# Patient Record
Sex: Male | Born: 1935
Health system: Southern US, Community
[De-identification: ages and names within clinical notes are randomized; demographics above are authoritative.]

## PROBLEM LIST (undated history)

## (undated) ENCOUNTER — Emergency Department (HOSPITAL_COMMUNITY): Admission: EM | Source: Home / Self Care

## (undated) DIAGNOSIS — I10 Essential (primary) hypertension: Secondary | ICD-10-CM

## (undated) DIAGNOSIS — I255 Ischemic cardiomyopathy: Secondary | ICD-10-CM

## (undated) DIAGNOSIS — N183 Chronic kidney disease, stage 3 unspecified: Secondary | ICD-10-CM

## (undated) DIAGNOSIS — I2119 ST elevation (STEMI) myocardial infarction involving other coronary artery of inferior wall: Secondary | ICD-10-CM

## (undated) DIAGNOSIS — I2510 Atherosclerotic heart disease of native coronary artery without angina pectoris: Secondary | ICD-10-CM

## (undated) DIAGNOSIS — I25119 Atherosclerotic heart disease of native coronary artery with unspecified angina pectoris: Secondary | ICD-10-CM

## (undated) DIAGNOSIS — E1121 Type 2 diabetes mellitus with diabetic nephropathy: Secondary | ICD-10-CM

## (undated) DIAGNOSIS — M069 Rheumatoid arthritis, unspecified: Secondary | ICD-10-CM

## (undated) DIAGNOSIS — I129 Hypertensive chronic kidney disease with stage 1 through stage 4 chronic kidney disease, or unspecified chronic kidney disease: Secondary | ICD-10-CM

## (undated) DIAGNOSIS — E118 Type 2 diabetes mellitus with unspecified complications: Secondary | ICD-10-CM

## (undated) DIAGNOSIS — G5603 Carpal tunnel syndrome, bilateral upper limbs: Secondary | ICD-10-CM

## (undated) DIAGNOSIS — E119 Type 2 diabetes mellitus without complications: Secondary | ICD-10-CM

## (undated) DIAGNOSIS — I220 Subsequent ST elevation (STEMI) myocardial infarction of anterior wall: Secondary | ICD-10-CM

## (undated) DIAGNOSIS — K2100 Gastro-esophageal reflux disease with esophagitis, without bleeding: Secondary | ICD-10-CM

## (undated) DIAGNOSIS — E669 Obesity, unspecified: Secondary | ICD-10-CM

## (undated) DIAGNOSIS — I5022 Chronic systolic (congestive) heart failure: Secondary | ICD-10-CM

## (undated) HISTORY — DX: Essential (primary) hypertension: I10

## (undated) HISTORY — DX: Gastro-esophageal reflux disease with esophagitis, without bleeding: K21.00

## (undated) HISTORY — DX: Type 2 diabetes mellitus with unspecified complications: E11.8

## (undated) HISTORY — DX: Hypertensive chronic kidney disease with stage 1 through stage 4 chronic kidney disease, or unspecified chronic kidney disease: I12.9

## (undated) HISTORY — DX: Chronic kidney disease, stage 3 unspecified: N18.30

## (undated) HISTORY — DX: Atherosclerotic heart disease of native coronary artery with unspecified angina pectoris: I25.119

## (undated) HISTORY — DX: Chronic systolic (congestive) heart failure: I50.22

## (undated) HISTORY — DX: Obesity, unspecified: E66.9

## (undated) HISTORY — DX: Carpal tunnel syndrome, bilateral upper limbs: G56.03

## (undated) HISTORY — DX: Subsequent ST elevation (STEMI) myocardial infarction of anterior wall: I22.0

## (undated) HISTORY — PX: APPENDECTOMY: SHX54

## (undated) HISTORY — DX: Rheumatoid arthritis, unspecified: M06.9

## (undated) HISTORY — DX: Type 2 diabetes mellitus with diabetic nephropathy: E11.21

---

## 2005-03-09 ENCOUNTER — Ambulatory Visit (HOSPITAL_COMMUNITY): Admission: RE | Admit: 2005-03-09 | Discharge: 2005-03-09 | Payer: Self-pay | Admitting: Pulmonary Disease

## 2014-03-03 ENCOUNTER — Emergency Department (HOSPITAL_COMMUNITY): Payer: Medicare Other

## 2014-03-03 ENCOUNTER — Encounter (HOSPITAL_COMMUNITY): Payer: Self-pay | Admitting: Emergency Medicine

## 2014-03-03 ENCOUNTER — Emergency Department (HOSPITAL_COMMUNITY)
Admission: EM | Admit: 2014-03-03 | Discharge: 2014-03-03 | Disposition: A | Discharge status: Home / Self Care | Payer: Medicare Other | Attending: Emergency Medicine | Admitting: Emergency Medicine

## 2014-03-03 DIAGNOSIS — E119 Type 2 diabetes mellitus without complications: Secondary | ICD-10-CM | POA: Insufficient documentation

## 2014-03-03 DIAGNOSIS — M1610 Unilateral primary osteoarthritis, unspecified hip: Secondary | ICD-10-CM | POA: Insufficient documentation

## 2014-03-03 DIAGNOSIS — I10 Essential (primary) hypertension: Secondary | ICD-10-CM | POA: Insufficient documentation

## 2014-03-03 DIAGNOSIS — M7918 Myalgia, other site: Secondary | ICD-10-CM

## 2014-03-03 DIAGNOSIS — M169 Osteoarthritis of hip, unspecified: Secondary | ICD-10-CM | POA: Insufficient documentation

## 2014-03-03 DIAGNOSIS — M160 Bilateral primary osteoarthritis of hip: Secondary | ICD-10-CM

## 2014-03-03 DIAGNOSIS — M533 Sacrococcygeal disorders, not elsewhere classified: Secondary | ICD-10-CM | POA: Insufficient documentation

## 2014-03-03 HISTORY — DX: Type 2 diabetes mellitus without complications: E11.9

## 2014-03-03 HISTORY — DX: Essential (primary) hypertension: I10

## 2014-03-03 MED ORDER — TRAMADOL HCL 50 MG PO TABS
50.0000 mg | ORAL_TABLET | Freq: Once | ORAL | Status: AC
  Administered 2014-03-03: 50 mg via ORAL
  Filled 2014-03-03: qty 1

## 2014-03-03 MED ORDER — TRAMADOL HCL 50 MG PO TABS: 50.0000 mg | ORAL_TABLET | Freq: Four times a day (QID) | ORAL | Status: DC | PRN

## 2014-03-03 NOTE — Discharge Instructions (Signed)
Arthritis, Nonspecific Arthritis is pain, redness, warmth, or puffiness (inflammation) of a joint. The joint may be stiff or hurt when you move it. One or more joints may be affected. There are many types of arthritis. Your doctor may not know what type you have right away. The most common cause of arthritis is wear and tear on the joint (osteoarthritis). HOME CARE   Only take medicine as told by your doctor.  Rest the joint as much as possible.  Raise (elevate) your joint if it is puffy.  Use crutches if the painful joint is in your leg.  Drink enough fluids to keep your pee (urine) clear or pale yellow.  Follow your doctor's diet instructions.  Use cold packs for very bad joint pain for 10 to 15 minutes every hour. Ask your doctor if it is okay for you to use hot packs.  Exercise as told by your doctor.  Take a warm shower if you have stiffness in the morning.  Move your sore joints throughout the day. GET HELP RIGHT AWAY IF:   You have a fever.  You have very bad joint pain, puffiness, or redness.  You have many joints that are painful and puffy.  You are not getting better with treatment.  You have very bad back pain or leg weakness.  You cannot control when you poop (bowel movement) or pee (urinate).  You do not feel better in 24 hours or are getting worse.  You are having side effects from your medicine. MAKE SURE YOU:   Understand these instructions.  Will watch your condition.  Will get help right away if you are not doing well or get worse. Document Released: 02/01/2010 Document Revised: 05/08/2012 Document Reviewed: 02/01/2010 [facility] Patient Information 2014 [place], [place].   You probably have a muscle spasm in your hip and thigh (piriformis muscle) causing your pain.  Call your doctor for a recheck and to discussed having physical therapy to treat this problem.  You may use the medicine prescribed for pain.  Use caution as this will make you  drowsy.

## 2014-03-03 NOTE — ED Notes (Signed)
Alert, NAD, family at bedside.pain rt hip and down rt thigh.

## 2014-03-03 NOTE — ED Notes (Signed)
Right leg pain.  Goes from hip down to knee (times 3 weeks).  Denies any injury.

## 2014-03-03 NOTE — ED Provider Notes (Signed)
CSN: [id number]     Arrival date & time 03/03/14  1718 History   First MD Initiated Contact with Patient 03/03/14 1817     Chief Complaint  Patient presents with  . Leg Pain     (Consider location/radiation/quality/duration/timing/severity/associated sxs/prior Treatment) Patient is a 79 y.o. male presenting with leg pain. The history is provided by the patient.  Leg Pain Associated symptoms: no back pain, no fever and no neck pain    Darrell KIEBLER is a 79 y.o. male presenting with a 3 week history of right posterior leg pain which he describes as a muscle spasm type pain which is triggered by walking and certain positions and movements and is worsened when he just gets up from a sitting position and at night particularly when he is lying down and attempts to roll on to his left side.  He denies weakness, numbness in the lower extremity and also denies low back pain, fevers or chills, rash and has had no injuries or falls.  He denies swelling in his leg and has no radiation of pain beyond his knee. He has used Aleve and heating pad with transient relief of symptoms.    Past Medical History  Diagnosis Date  . Diabetes mellitus without complication   . Hypertension    History reviewed. No pertinent past surgical history. History reviewed. No pertinent family history. History  Substance Use Topics  . Smoking status: Never Smoker   . Smokeless tobacco: Not on file  . Alcohol Use: No    Review of Systems  Constitutional: Negative for fever.  Respiratory: Negative.   Cardiovascular: Negative.   Musculoskeletal: Positive for arthralgias. Negative for back pain, joint swelling, myalgias and neck pain.  Skin: Negative for color change and rash.  Neurological: Negative for weakness and numbness.      Allergies  Review of patient's allergies indicates not on file.  Home Medications  No current outpatient prescriptions on file. BP 174/81  Pulse 76  Temp(Src) 97.8 F (36.6 C)  (Oral)  Resp 18  Ht 5\' 8"  (1.727 m)  Wt 255 lb (115.667 kg)  BMI 38.78 kg/m2  SpO2 98% Physical Exam  Constitutional: He appears well-developed and well-nourished.  HENT:  Head: Atraumatic.  Neck: Normal range of motion.  Cardiovascular:  Pulses equal bilaterally  Musculoskeletal: He exhibits tenderness.  ttp right posterior upper thigh,deep in his gluteal fold.  No ttp over lateral thigh or greater trochanteric bursa.  He has pain with resisted abduction of the right hip.  Pedal pulses full, no peripheral edema.  No pain with ROM of knee joint.  Neurological: He is alert. He has normal strength. He displays normal reflexes. No sensory deficit.  Skin: Skin is warm and dry.  Psychiatric: He has a normal mood and affect.    ED Course  Procedures (including critical care time) Labs Review Labs Reviewed - No data to display Imaging Review No results found.   EKG Interpretation None     No results found for this or any previous visit.   MDM   Final diagnoses:  None    Patients labs and/or radiological studies were viewed and considered during the medical decision making and disposition process. Pt was also seen by Dr Jeneen Rinks during this visit.  Pt was prescribed ultram.  Encouraged he may continue taking aleve as long as he has no stomach upset from this medicine.  F/u with pcp this week to arrange PT.  xrays revealing for mild bilateral hip  arthritis.  Pain consistent with muscle spasm or strain,  Possibly piriformis syndrome.    Evalee Jefferson, PA-C 03/03/14 2019

## 2014-03-03 NOTE — ED Provider Notes (Signed)
Pt seen and examined.  History of 2-3 weeks of rt posterior leg pain.  Exam shows findings consistent with Piriformis tightness, sciatica referred pain.  Plan is PCP f/u re: PT referral.  Tanna Furry, MD 03/03/14 2014

## 2014-03-04 NOTE — ED Provider Notes (Signed)
Medical screening examination/treatment/procedure(s) were performed by non-physician practitioner and as supervising physician I was immediately available for consultation/collaboration.   EKG Interpretation None        Tanna Furry, MD 03/04/14 1537

## 2014-05-07 LAB — PSA: PSA: 3.01

## 2015-02-09 DIAGNOSIS — I10 Essential (primary) hypertension: Secondary | ICD-10-CM | POA: Diagnosis not present

## 2015-02-09 DIAGNOSIS — E118 Type 2 diabetes mellitus with unspecified complications: Secondary | ICD-10-CM | POA: Diagnosis not present

## 2015-02-09 DIAGNOSIS — M19041 Primary osteoarthritis, right hand: Secondary | ICD-10-CM | POA: Diagnosis not present

## 2015-02-09 DIAGNOSIS — E669 Obesity, unspecified: Secondary | ICD-10-CM | POA: Diagnosis not present

## 2015-05-21 DIAGNOSIS — Z0000 Encounter for general adult medical examination without abnormal findings: Secondary | ICD-10-CM | POA: Diagnosis not present

## 2015-05-21 DIAGNOSIS — Z1211 Encounter for screening for malignant neoplasm of colon: Secondary | ICD-10-CM | POA: Diagnosis not present

## 2015-05-22 DIAGNOSIS — Z0000 Encounter for general adult medical examination without abnormal findings: Secondary | ICD-10-CM | POA: Diagnosis not present

## 2015-09-22 DIAGNOSIS — E669 Obesity, unspecified: Secondary | ICD-10-CM | POA: Diagnosis not present

## 2015-09-22 DIAGNOSIS — K210 Gastro-esophageal reflux disease with esophagitis: Secondary | ICD-10-CM | POA: Diagnosis not present

## 2015-09-22 DIAGNOSIS — I10 Essential (primary) hypertension: Secondary | ICD-10-CM | POA: Diagnosis not present

## 2015-09-22 DIAGNOSIS — Z23 Encounter for immunization: Secondary | ICD-10-CM | POA: Diagnosis not present

## 2015-09-22 DIAGNOSIS — E119 Type 2 diabetes mellitus without complications: Secondary | ICD-10-CM | POA: Diagnosis not present

## 2015-11-02 DIAGNOSIS — J209 Acute bronchitis, unspecified: Secondary | ICD-10-CM | POA: Diagnosis not present

## 2015-11-02 DIAGNOSIS — E119 Type 2 diabetes mellitus without complications: Secondary | ICD-10-CM | POA: Diagnosis not present

## 2015-11-02 DIAGNOSIS — I10 Essential (primary) hypertension: Secondary | ICD-10-CM | POA: Diagnosis not present

## 2015-11-02 DIAGNOSIS — E669 Obesity, unspecified: Secondary | ICD-10-CM | POA: Diagnosis not present

## 2015-11-30 DIAGNOSIS — H524 Presbyopia: Secondary | ICD-10-CM | POA: Diagnosis not present

## 2015-11-30 DIAGNOSIS — H43813 Vitreous degeneration, bilateral: Secondary | ICD-10-CM | POA: Diagnosis not present

## 2015-11-30 DIAGNOSIS — H52221 Regular astigmatism, right eye: Secondary | ICD-10-CM | POA: Diagnosis not present

## 2015-11-30 DIAGNOSIS — H5201 Hypermetropia, right eye: Secondary | ICD-10-CM | POA: Diagnosis not present

## 2015-12-23 DIAGNOSIS — Z0000 Encounter for general adult medical examination without abnormal findings: Secondary | ICD-10-CM | POA: Diagnosis not present

## 2015-12-24 DIAGNOSIS — I10 Essential (primary) hypertension: Secondary | ICD-10-CM | POA: Diagnosis not present

## 2015-12-24 DIAGNOSIS — E118 Type 2 diabetes mellitus with unspecified complications: Secondary | ICD-10-CM | POA: Diagnosis not present

## 2015-12-24 DIAGNOSIS — Z0000 Encounter for general adult medical examination without abnormal findings: Secondary | ICD-10-CM | POA: Diagnosis not present

## 2016-01-01 LAB — FECAL OCCULT BLOOD, GUAIAC: Fecal Occult Blood: NEGATIVE

## 2016-01-08 DIAGNOSIS — Z1211 Encounter for screening for malignant neoplasm of colon: Secondary | ICD-10-CM | POA: Diagnosis not present

## 2016-01-12 ENCOUNTER — Encounter (HOSPITAL_COMMUNITY): Payer: Self-pay | Admitting: Emergency Medicine

## 2016-01-12 ENCOUNTER — Inpatient Hospital Stay (HOSPITAL_COMMUNITY)
Admission: EM | Admit: 2016-01-12 | Discharge: 2016-01-15 | DRG: 246 | Disposition: A | Discharge status: Home / Self Care | Payer: Medicare Other | Attending: Cardiovascular Disease | Admitting: Cardiovascular Disease

## 2016-01-12 ENCOUNTER — Emergency Department (HOSPITAL_COMMUNITY): Payer: Medicare Other

## 2016-01-12 ENCOUNTER — Encounter (HOSPITAL_COMMUNITY)
Admission: EM | Disposition: A | Discharge status: Home / Self Care | Payer: Self-pay | Source: Home / Self Care | Attending: Cardiovascular Disease

## 2016-01-12 DIAGNOSIS — I2119 ST elevation (STEMI) myocardial infarction involving other coronary artery of inferior wall: Secondary | ICD-10-CM | POA: Diagnosis not present

## 2016-01-12 DIAGNOSIS — E1122 Type 2 diabetes mellitus with diabetic chronic kidney disease: Secondary | ICD-10-CM | POA: Diagnosis not present

## 2016-01-12 DIAGNOSIS — E1129 Type 2 diabetes mellitus with other diabetic kidney complication: Secondary | ICD-10-CM

## 2016-01-12 DIAGNOSIS — I4901 Ventricular fibrillation: Secondary | ICD-10-CM | POA: Diagnosis not present

## 2016-01-12 DIAGNOSIS — Z7984 Long term (current) use of oral hypoglycemic drugs: Secondary | ICD-10-CM

## 2016-01-12 DIAGNOSIS — R079 Chest pain, unspecified: Secondary | ICD-10-CM | POA: Diagnosis not present

## 2016-01-12 DIAGNOSIS — Z9049 Acquired absence of other specified parts of digestive tract: Secondary | ICD-10-CM | POA: Diagnosis not present

## 2016-01-12 DIAGNOSIS — I472 Ventricular tachycardia: Secondary | ICD-10-CM | POA: Diagnosis not present

## 2016-01-12 DIAGNOSIS — E785 Hyperlipidemia, unspecified: Secondary | ICD-10-CM | POA: Diagnosis not present

## 2016-01-12 DIAGNOSIS — I110 Hypertensive heart disease with heart failure: Secondary | ICD-10-CM | POA: Diagnosis not present

## 2016-01-12 DIAGNOSIS — N183 Chronic kidney disease, stage 3 unspecified: Secondary | ICD-10-CM

## 2016-01-12 DIAGNOSIS — I213 ST elevation (STEMI) myocardial infarction of unspecified site: Secondary | ICD-10-CM

## 2016-01-12 DIAGNOSIS — I2111 ST elevation (STEMI) myocardial infarction involving right coronary artery: Secondary | ICD-10-CM | POA: Diagnosis not present

## 2016-01-12 DIAGNOSIS — I10 Essential (primary) hypertension: Secondary | ICD-10-CM | POA: Diagnosis present

## 2016-01-12 DIAGNOSIS — N184 Chronic kidney disease, stage 4 (severe): Secondary | ICD-10-CM

## 2016-01-12 DIAGNOSIS — I129 Hypertensive chronic kidney disease with stage 1 through stage 4 chronic kidney disease, or unspecified chronic kidney disease: Secondary | ICD-10-CM | POA: Diagnosis not present

## 2016-01-12 DIAGNOSIS — R001 Bradycardia, unspecified: Secondary | ICD-10-CM | POA: Diagnosis present

## 2016-01-12 DIAGNOSIS — I2510 Atherosclerotic heart disease of native coronary artery without angina pectoris: Secondary | ICD-10-CM | POA: Diagnosis present

## 2016-01-12 DIAGNOSIS — Z9861 Coronary angioplasty status: Secondary | ICD-10-CM

## 2016-01-12 DIAGNOSIS — I255 Ischemic cardiomyopathy: Secondary | ICD-10-CM | POA: Diagnosis not present

## 2016-01-12 DIAGNOSIS — R0682 Tachypnea, not elsewhere classified: Secondary | ICD-10-CM | POA: Diagnosis not present

## 2016-01-12 DIAGNOSIS — I2582 Chronic total occlusion of coronary artery: Secondary | ICD-10-CM | POA: Diagnosis not present

## 2016-01-12 DIAGNOSIS — Z79899 Other long term (current) drug therapy: Secondary | ICD-10-CM | POA: Diagnosis not present

## 2016-01-12 DIAGNOSIS — Z955 Presence of coronary angioplasty implant and graft: Secondary | ICD-10-CM

## 2016-01-12 HISTORY — DX: Chronic kidney disease, stage 3 (moderate): N18.3

## 2016-01-12 HISTORY — DX: ST elevation (STEMI) myocardial infarction involving other coronary artery of inferior wall: I21.19

## 2016-01-12 HISTORY — DX: Atherosclerotic heart disease of native coronary artery without angina pectoris: I25.10

## 2016-01-12 HISTORY — PX: CORONARY STENT PLACEMENT: SHX1402

## 2016-01-12 HISTORY — PX: CARDIAC CATHETERIZATION: SHX172

## 2016-01-12 HISTORY — DX: Ischemic cardiomyopathy: I25.5

## 2016-01-12 LAB — CBC
HEMATOCRIT: 41.6 % (ref 39.0–52.0)
HEMOGLOBIN: 13.5 g/dL (ref 13.0–17.0)
MCH: 23.2 pg — ABNORMAL LOW (ref 26.0–34.0)
MCHC: 32.5 g/dL (ref 30.0–36.0)
MCV: 71.5 fL — ABNORMAL LOW (ref 78.0–100.0)
Platelets: 255 10*3/uL (ref 150–400)
RBC: 5.82 MIL/uL — ABNORMAL HIGH (ref 4.22–5.81)
RDW: 16.1 % — AB (ref 11.5–15.5)
WBC: 9.2 10*3/uL (ref 4.0–10.5)

## 2016-01-12 LAB — BASIC METABOLIC PANEL
ANION GAP: 9 (ref 5–15)
BUN: 20 mg/dL (ref 6–20)
CO2: 23 mmol/L (ref 22–32)
Calcium: 8.7 mg/dL — ABNORMAL LOW (ref 8.9–10.3)
Chloride: 105 mmol/L (ref 101–111)
Creatinine, Ser: 1.72 mg/dL — ABNORMAL HIGH (ref 0.61–1.24)
GFR calc Af Amer: 42 mL/min — ABNORMAL LOW (ref >60)
GFR, EST NON AFRICAN AMERICAN: 36 mL/min — AB (ref >60)
Glucose, Bld: 130 mg/dL — ABNORMAL HIGH (ref 65–99)
POTASSIUM: 3.6 mmol/L (ref 3.5–5.1)
SODIUM: 137 mmol/L (ref 135–145)

## 2016-01-12 LAB — CBG MONITORING, ED: Glucose-Capillary: 124 mg/dL — ABNORMAL HIGH (ref 65–99)

## 2016-01-12 LAB — TROPONIN I: TROPONIN I: 0.05 ng/mL — AB (ref <0.031)

## 2016-01-12 SURGERY — LEFT HEART CATH AND CORONARY ANGIOGRAPHY
Anesthesia: Moderate Sedation

## 2016-01-12 MED ORDER — FENTANYL CITRATE (PF) 100 MCG/2ML IJ SOLN
INTRAMUSCULAR | Status: AC
  Filled 2016-01-12: qty 2

## 2016-01-12 MED ORDER — MIDAZOLAM HCL 2 MG/2ML IJ SOLN
INTRAMUSCULAR | Status: DC | PRN
  Administered 2016-01-12: 2 mg via INTRAVENOUS

## 2016-01-12 MED ORDER — NITROGLYCERIN 1 MG/10 ML FOR IR/CATH LAB
INTRA_ARTERIAL | Status: AC
  Filled 2016-01-12: qty 10

## 2016-01-12 MED ORDER — MIDAZOLAM HCL 2 MG/2ML IJ SOLN
INTRAMUSCULAR | Status: AC
  Filled 2016-01-12: qty 2

## 2016-01-12 MED ORDER — SODIUM CHLORIDE 0.9 % IV SOLN
INTRAVENOUS | Status: DC
  Administered 2016-01-12: 22:00:00 via INTRAVENOUS

## 2016-01-12 MED ORDER — BIVALIRUDIN 250 MG IV SOLR
INTRAVENOUS | Status: AC
  Filled 2016-01-12: qty 250

## 2016-01-12 MED ORDER — LIDOCAINE HCL (PF) 1 % IJ SOLN
INTRAMUSCULAR | Status: DC | PRN
  Administered 2016-01-12: 15 mL

## 2016-01-12 MED ORDER — LIDOCAINE HCL (PF) 1 % IJ SOLN
INTRAMUSCULAR | Status: AC
  Filled 2016-01-12: qty 30

## 2016-01-12 MED ORDER — NITROGLYCERIN 1 MG/10 ML FOR IR/CATH LAB
INTRA_ARTERIAL | Status: DC | PRN
  Administered 2016-01-12: 200 ug

## 2016-01-12 MED ORDER — ATROPINE SULFATE 1 MG/ML IJ SOLN
1.0000 mg | Freq: Once | INTRAMUSCULAR | Status: AC
  Administered 2016-01-12: 1 mg via INTRAVENOUS

## 2016-01-12 MED ORDER — BIVALIRUDIN BOLUS VIA INFUSION - CUPID
INTRAVENOUS | Status: DC | PRN
  Administered 2016-01-12: 86.4 mg via INTRAVENOUS

## 2016-01-12 MED ORDER — HEPARIN (PORCINE) IN NACL 2-0.9 UNIT/ML-% IJ SOLN
INTRAMUSCULAR | Status: AC
  Filled 2016-01-12: qty 1500

## 2016-01-12 MED ORDER — DOPAMINE-DEXTROSE 3.2-5 MG/ML-% IV SOLN: 0.0000 ug/kg/min | Freq: Once | INTRAVENOUS | Status: DC

## 2016-01-12 MED ORDER — VERAPAMIL HCL 2.5 MG/ML IV SOLN
INTRAVENOUS | Status: AC
  Filled 2016-01-12: qty 2

## 2016-01-12 MED ORDER — TICAGRELOR 90 MG PO TABS
ORAL_TABLET | ORAL | Status: AC
  Filled 2016-01-12: qty 1

## 2016-01-12 MED ORDER — FENTANYL CITRATE (PF) 100 MCG/2ML IJ SOLN
INTRAMUSCULAR | Status: DC | PRN
  Administered 2016-01-12: 25 ug via INTRAVENOUS
  Administered 2016-01-12: 50 ug via INTRAVENOUS

## 2016-01-12 MED ORDER — SODIUM CHLORIDE 0.9 % IV SOLN
250.0000 mg | INTRAVENOUS | Status: DC | PRN
  Administered 2016-01-12: 1.75 mg/kg/h via INTRAVENOUS

## 2016-01-12 MED ORDER — VERAPAMIL HCL 2.5 MG/ML IV SOLN
INTRAVENOUS | Status: DC | PRN
  Administered 2016-01-12: 22:00:00 via INTRA_ARTERIAL

## 2016-01-12 MED ORDER — TICAGRELOR 90 MG PO TABS
ORAL_TABLET | ORAL | Status: DC | PRN
Start: 2016-01-12 — End: 2016-01-12
  Administered 2016-01-12: 180 mg via ORAL

## 2016-01-12 MED ORDER — HEPARIN SODIUM (PORCINE) 5000 UNIT/ML IJ SOLN
4000.0000 [IU] | INTRAMUSCULAR | Status: AC
  Administered 2016-01-12: 4000 [IU] via INTRAVENOUS
  Filled 2016-01-12: qty 1

## 2016-01-12 MED ORDER — ASPIRIN 81 MG PO CHEW
324.0000 mg | CHEWABLE_TABLET | Freq: Once | ORAL | Status: AC
  Administered 2016-01-12: 324 mg via ORAL
  Filled 2016-01-12: qty 4

## 2016-01-12 MED ORDER — HEPARIN (PORCINE) IN NACL 2-0.9 UNIT/ML-% IJ SOLN
INTRAMUSCULAR | Status: DC | PRN
Start: 2016-01-12 — End: 2016-01-12
  Administered 2016-01-12: 23:00:00

## 2016-01-12 SURGICAL SUPPLY — 27 items
BALLN EMERGE MR 2.5X15 (BALLOONS) ×3
BALLN ~~LOC~~ EMERGE MR 3.75X12 (BALLOONS) ×3
BALLOON EMERGE MR 2.5X15 (BALLOONS) ×1 IMPLANT
BALLOON ~~LOC~~ EMERGE MR 3.75X12 (BALLOONS) ×1 IMPLANT
CATH INFINITI 5 FR JL3.5 (CATHETERS) ×3 IMPLANT
CATH INFINITI 5FR JL4 (CATHETERS) ×3 IMPLANT
CATH VISTA GUIDE 6FR JR4 (CATHETERS) ×6 IMPLANT
DEVICE CLOSURE PERCLS PRGLD 6F (VASCULAR PRODUCTS) ×1 IMPLANT
DEVICE RAD COMP TR BAND LRG (VASCULAR PRODUCTS) ×3 IMPLANT
GLIDESHEATH SLEND SS 6F .021 (SHEATH) ×3 IMPLANT
KIT ENCORE 26 ADVANTAGE (KITS) ×3 IMPLANT
KIT HEART LEFT (KITS) ×3 IMPLANT
PACK CARDIAC CATHETERIZATION (CUSTOM PROCEDURE TRAY) ×3 IMPLANT
PERCLOSE PROGLIDE 6F (VASCULAR PRODUCTS) ×3
PINNACLE LONG 6F 25CM (SHEATH) ×3
SHEATH INTRO PINNACLE 6F 25CM (SHEATH) ×1 IMPLANT
SHEATH PINNACLE 6F 10CM (SHEATH) ×3 IMPLANT
SHEATH PINNACLE 7F 10CM (SHEATH) ×3 IMPLANT
SHEATH PINNACLE ST 7F 45CM (SHEATH) ×3 IMPLANT
STENT PROMUS PREM MR 3.5X16 (Permanent Stent) ×3 IMPLANT
TRANSDUCER W/STOPCOCK (MISCELLANEOUS) ×3 IMPLANT
TUBING CIL FLEX 10 FLL-RA (TUBING) ×3 IMPLANT
VALVE GUARDIAN II ~~LOC~~ HEMO (MISCELLANEOUS) ×3 IMPLANT
WIRE AMPLATZ ST .035X145CM (WIRE) ×3 IMPLANT
WIRE COUGAR XT STRL 190CM (WIRE) ×3 IMPLANT
WIRE EMERALD 3MM-J .035X150CM (WIRE) ×3 IMPLANT
WIRE SAFE-T 1.5MM-J .035X260CM (WIRE) ×3 IMPLANT

## 2016-01-12 NOTE — ED Provider Notes (Addendum)
CSN: [id number]     Arrival date & time 01/12/16  2039 History   By signing my name below, I, Forrestine Him, attest that this documentation has been prepared under the direction and in the presence of Ezequiel Essex, MD.  Electronically Signed: Forrestine Him, ED Scribe. 01/12/2016. 9:36 PM.   Chief Complaint  Patient presents with  . Shortness of Breath  . Dizziness   The history is provided by the patient. No language interpreter was used.    LEVEL 5 CAVEAT DUE TO ACUITY OF CONDITION   HPI Comments: GREEN SCHERR brought in by EMS is a 81 y.o. male with a PMHx of DM and HTN who presents to the Emergency Department complaining of shortness of breath onset 4:00 PM this afternoon while spraying tire cleaner on his tires. Pt also reports ongoing, intermittent dizziness, diaphoresis, and nausea. Pt denies any chest pain at this time. No prior cardiac history reported.  PCP: Alonza Bogus, MD    Past Medical History  Diagnosis Date  . Diabetes mellitus without complication ([place])   . Hypertension    Past Surgical History  Procedure Laterality Date  . Appendectomy     History reviewed. No pertinent family history. Social History  Substance Use Topics  . Smoking status: Never Smoker   . Smokeless tobacco: None  . Alcohol Use: No    Review of Systems  LEVEL 5 CAVEAT- DUE TO ACUITY OF CONDITION  Allergies  Review of patient's allergies indicates no known allergies.  Home Medications   Prior to Admission medications   Medication Sig Start Date End Date Taking? Authorizing Provider  diltiazem (DILACOR XR) 180 MG 24 hr capsule Take 180 mg by mouth daily. 02/04/14   Historical Provider, MD  famotidine (PEPCID) 40 MG tablet Take 40 mg by mouth daily. 01/16/14   Historical Provider, MD  losartan (COZAAR) 100 MG tablet Take 100 mg by mouth daily. 03/02/14   Historical Provider, MD  metFORMIN (GLUCOPHAGE) 500 MG tablet Take 500 mg by mouth 2 (two) times daily. 02/21/14   Historical  Provider, MD  traMADol (ULTRAM) 50 MG tablet Take 1 tablet (50 mg total) by mouth every 6 (six) hours as needed. 03/03/14   Evalee Jefferson, PA-C   Triage Vitals: BP 173/83 mmHg  Pulse 73  Temp(Src) 97.5 F (36.4 C) (Axillary)  Resp 20  Ht 5\' 9"  (1.753 m)  Wt 254 lb (115.214 kg)  BMI 37.49 kg/m2  SpO2 100%    Physical Exam  Constitutional: He is oriented to person, place, and time. He appears well-developed and well-nourished. He appears distressed.  Very diaphoretic  HENT:  Head: Normocephalic and atraumatic.  Mouth/Throat: Oropharynx is clear and moist. No oropharyngeal exudate.  Eyes: Conjunctivae and EOM are normal. Pupils are equal, round, and reactive to light.  Neck: Normal range of motion. Neck supple.  No meningismus.  Cardiovascular: Regular rhythm, normal heart sounds and intact distal pulses.  Bradycardia present.   No murmur heard. Bradycardic in the 30'S  Pulmonary/Chest:  Labored respirations  Lungs are clear  Abdominal: Soft. There is no tenderness. There is no rebound and no guarding.  Musculoskeletal: Normal range of motion. He exhibits no edema or tenderness.  No peripheral edema noted   Neurological: He is alert and oriented to person, place, and time. No cranial nerve deficit. He exhibits normal muscle tone. Coordination normal.  No ataxia on finger to nose bilaterally. No pronator drift. 5/5 strength throughout. CN 2-12 intact.Equal grip strength. Sensation intact.  Skin: Skin is warm. He is diaphoretic.  Psychiatric: He has a normal mood and affect. His behavior is normal.  Nursing note and vitals reviewed.   ED Course  Procedures (including critical care time)  DIAGNOSTIC STUDIES: Oxygen Saturation is 100% on RA, Normal by my interpretation.    COORDINATION OF CARE: 9:35 PM- Will give fluids, ASA, heparn, dopamine, and atropine injection. Will order BMP, CBC, CXR, EKG, and Troponin I. Pt will be transferred to [facility] and go straight to cath lab. Discussed  treatment plan with pt at bedside and pt agreed to plan.     Labs Review Labs Reviewed  CBC - Abnormal; Notable for the following:    RBC 5.82 (*)    MCV 71.5 (*)    MCH 23.2 (*)    RDW 16.1 (*)    All other components within normal limits  CBG MONITORING, ED - Abnormal; Notable for the following:    Glucose-Capillary 124 (*)    All other components within normal limits  BASIC METABOLIC PANEL  TROPONIN I  I-STAT TROPOININ, ED    Imaging Review No results found. I have personally reviewed and evaluated these images and lab results as part of my medical decision-making.   EKG Interpretation   Date/Time:  Tuesday January 12 2016 20:54:00 EST Ventricular Rate:  70 PR Interval:  200 QRS Duration: 94 QT Interval:  416 QTC Calculation: 449 R Axis:   3 Text Interpretation:  Normal sinus rhythm Nonspecific T wave abnormality  Abnormal ECG No previous ECGs available Confirmed by Christy Gentles  MD, Elenore Rota  [phone]) on 01/12/2016 9:05:55 PM      MDM   Final diagnoses:  ST elevation myocardial infarction (STEMI), unspecified artery ([place])   On initial evaluation, patient is diaphoretic, dyspneic and ill-appearing. EKG in triage showed sinus rhythm with nonspecific ST abnormalities. He is now bradycardic in the 30s. Repeat EKG shows ST elevation in inferior and lateral leads and code STEMI Activated.  CBG 124. Aspirin, heparin given. atropine given and patient placed on pacer pads. Dopamine readied at bedside.  Discussed with Dr. Burt Knack. He agrees with emergency traffic to [facility] cone catheterization lab. Heart rate now in the 80s after atropine. The EKG did not show any evidence of heart block but did show sinus bradycardia. Dopamine given to EMS but not started.  Blood pressure and mental status stable at time of transfer to EMS. Maintaining airway at time of transfer.  CRITICAL CARE Performed by: Ezequiel Essex Total critical care time: 35 minutes Critical care time was  exclusive of separately billable procedures and treating other patients. Critical care was necessary to treat or prevent imminent or life-threatening deterioration. Critical care was time spent personally by me on the following activities: development of treatment plan with patient and/or surrogate as well as nursing, discussions with consultants, evaluation of patient's response to treatment, examination of patient, obtaining history from patient or surrogate, ordering and performing treatments and interventions, ordering and review of laboratory studies, ordering and review of radiographic studies, pulse oximetry and re-evaluation of patient's condition.  I personally performed the services described in this documentation, which was scribed in my presence. The recorded information has been reviewed and is accurate.    Ezequiel Essex, MD 01/12/16 [id number]  Ezequiel Essex, MD 01/13/16 0000

## 2016-01-12 NOTE — ED Notes (Signed)
Patient states he drank water prior to triage room. Was unable to obtain oral temp. Axillary temp 97.5 at triage.

## 2016-01-12 NOTE — ED Notes (Addendum)
Patient states "I was spraying tire cleaner on my tires today at 4:00 and got a little dizzy and short of breath. I think I inhaled too much of it. I got better, then I had another spell of dizziness about 10 minutes before I got here." Patient denies pain at this time. Patient clammy at triage. States "I've been sweating a lot."

## 2016-01-12 NOTE — H&P (Signed)
Darrell Marshall is an 81 y.o. male.   Chief Complaint: chest pain, inferior STEMI, transfer from [facility] Primary Cardiologist: new Burt Knack HPI: Darrell Marshall is a 81 yo man with PMH of T2DM on metformin and hypertension who presented to [facility] ER with acute onset of dyspnea around 4:00 pm this afternoon. 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 He subsequently noted some central chest discomfort along with associated nausea, diaphoresis and dizziness. He previous lived/worked in [place] for [employer] for 33 years and now works part-time a [employer]. He is active and has no limitations. No previous chest pain or syncope. His parents and siblings did not have CAD. At [facility] a repeat ECG revealed inferior STEMI with bradycardia. Per report he was bradycardic as low as 30s and received atropine with improved HR. He received aspirin 325 mg and heparin 4000 units and was transferred to [facility]. ON arrival, he was immediately taken to the cath lab and he said his chest discomfort was as severe as 8/10.   Left coronary artery with nonobstructive disease and RCA with distal 99% thought to be culprit. He had brief ventricular fibrillation episode that was treated with defibrillation engaging/wiring the RCA and he subsequently received a DES.   Past Medical History  Diagnosis Date  . Diabetes mellitus without complication ([place])   . Hypertension     Past Surgical History  Procedure Laterality Date  . Appendectomy      History reviewed. No pertinent family history. Social History:  reports that he has never smoked. He does not have any smokeless tobacco history on file. He reports that he does not drink alcohol or use illicit drugs.  Allergies: No Known Allergies  Medications Prior to Admission  Medication Sig Dispense Refill  . diltiazem (DILACOR XR) 180 MG 24 hr capsule Take 180 mg by mouth daily.    . famotidine (PEPCID) 40 MG  tablet Take 40 mg by mouth daily.    [name] Kitchen losartan (COZAAR) 100 MG tablet Take 100 mg by mouth daily.    . metFORMIN (GLUCOPHAGE) 500 MG tablet Take 500 mg by mouth 2 (two) times daily.    . traMADol (ULTRAM) 50 MG tablet Take 1 tablet (50 mg total) by mouth every 6 (six) hours as needed. 15 tablet 0    Results for orders placed or performed during the hospital encounter of 01/12/16 (from the past 48 hour(s))  CBG monitoring, ED     Status: Abnormal   Collection Time: 01/12/16  9:19 PM  Result Value Ref Range   Glucose-Capillary 124 (H) 65 - 99 mg/dL   Comment 1 Notify RN    Comment 2 Document in Chart   Basic metabolic panel     Status: Abnormal   Collection Time: 01/12/16  9:20 PM  Result Value Ref Range   Sodium 137 135 - 145 mmol/L   Potassium 3.6 3.5 - 5.1 mmol/L   Chloride 105 101 - 111 mmol/L   CO2 23 22 - 32 mmol/L   Glucose, Bld 130 (H) 65 - 99 mg/dL   BUN 20 6 - 20 mg/dL   Creatinine, Ser 1.72 (H) 0.61 - 1.24 mg/dL   Calcium 8.7 (L) 8.9 - 10.3 mg/dL   GFR calc non Af Amer 36 (L) >60 mL/min   GFR calc Af Amer 42 (L) >60 mL/min    Comment: (NOTE) The eGFR has been calculated using the  CKD EPI equation. This calculation has not been validated in all clinical situations. eGFR's persistently <60 mL/min signify possible Chronic Kidney Disease.    Anion gap 9 5 - 15  CBC     Status: Abnormal   Collection Time: 01/12/16  9:20 PM  Result Value Ref Range   WBC 9.2 4.0 - 10.5 K/uL   RBC 5.82 (H) 4.22 - 5.81 MIL/uL   Hemoglobin 13.5 13.0 - 17.0 g/dL   HCT 41.6 39.0 - 52.0 %   MCV 71.5 (L) 78.0 - 100.0 fL   MCH 23.2 (L) 26.0 - 34.0 pg   MCHC 32.5 30.0 - 36.0 g/dL   RDW 16.1 (H) 11.5 - 15.5 %   Platelets 255 150 - 400 K/uL  Troponin I     Status: Abnormal   Collection Time: 01/12/16  9:20 PM  Result Value Ref Range   Troponin I 0.05 (H) <0.031 ng/mL    Comment:        PERSISTENTLY INCREASED TROPONIN VALUES IN THE RANGE OF 0.04-0.49 ng/mL CAN BE SEEN IN:       -UNSTABLE  ANGINA       -CONGESTIVE HEART FAILURE       -MYOCARDITIS       -CHEST TRAUMA       -ARRYHTHMIAS       -LATE PRESENTING MYOCARDIAL INFARCTION       -COPD   CLINICAL FOLLOW-UP RECOMMENDED.    No results found.  Review of Systems  Constitutional: Negative for fever, chills, weight loss and malaise/fatigue.  HENT: Negative for ear pain and tinnitus.   Eyes: Negative for double vision and photophobia.  Respiratory: Positive for shortness of breath. Negative for cough and hemoptysis.   Cardiovascular: Positive for chest pain. Negative for leg swelling and PND.  Gastrointestinal: Positive for nausea. Negative for heartburn, vomiting and abdominal pain.  Genitourinary: Negative for dysuria and hematuria.  Musculoskeletal: Negative for myalgias and neck pain.  Skin: Negative for rash.  Neurological: Positive for dizziness. Negative for tingling, tremors and headaches.  Endo/Heme/Allergies: Negative for polydipsia. Does not bruise/bleed easily.  Psychiatric/Behavioral: Negative for depression, suicidal ideas and substance abuse.    Blood pressure 173/83, pulse 73, temperature 97.5 F (36.4 C), temperature source Axillary, resp. rate 20, height _0  (1.753 m), weight 115.214 kg (254 lb), SpO2 100 %. Physical Exam  Nursing note and vitals reviewed. Constitutional: He is oriented to person, place, and time. He appears well-developed and well-nourished. He appears distressed.  HENT:  Nose: Nose normal.  Mouth/Throat: Oropharynx is clear and moist. No oropharyngeal exudate.  Eyes: Conjunctivae and EOM are normal. Pupils are equal, round, and reactive to light. No scleral icterus.  Neck: Normal range of motion. Neck supple. No JVD present. No tracheal deviation present.  Cardiovascular: Normal rate, regular rhythm, normal heart sounds and intact distal pulses.  Exam reveals no gallop.   No murmur heard. Respiratory: Effort normal and breath sounds normal. No respiratory distress. He has no  wheezes. He has no rales.  GI: Soft. Bowel sounds are normal. He exhibits no distension. There is no tenderness. There is no rebound and no guarding.  Musculoskeletal: Normal range of motion. He exhibits no edema or tenderness.  Neurological: He is alert and oriented to person, place, and time. No cranial nerve deficit. Coordination normal.  Skin: Skin is warm. No rash noted. He is diaphoretic. No erythema.  Psychiatric: He has a normal mood and affect. His behavior is normal. Judgment and thought content normal.  EKG with sinus bradycardia, inferior STEMI Creatinine 1.7  Assessment/Plan Darrell Marshall is a 81 yo man with PMH of T2DM and hypertension who presented with chest pain and found to have inferior STEMI now s/p PCI to distal RCA. Loaded with ticagrelor in cath lab. Plan for ICU admission with telemetry and post-STEMI care.  Problem List Chest Pain with Inferior STEMI Chronic Kidney Disease Stage III T2DM Hypertension Dyslipidemia  Plan 1. Trend troponins, admit to CCU 2. Continue DAPT with ticagrelor 90 mg bid and asa 81 mg for at least one year 3. BNP, TSH, lipid panel, hba1c 4. Atorvastatin 80 mg qHS first dose now 5. Refer to cardiac rehab at discharge  Jules Husbands, MD 01/12/2016, 10:08 PM

## 2016-01-12 NOTE — ED Notes (Signed)
EKG given to Dr. Melanee Left.

## 2016-01-12 NOTE — ED Notes (Signed)
Took patient to room via wheelchair. Patient Ambulated from wheelchair to stretcher without difficulty. As patient was lying in bed he voiced to writer "my chest is burning." Patient became diaphoretic and monitor showed heart rate of 45. EKG previously obtained in triage room and given to Dr. Melanee Left. Obtained new EKG r/t new symptoms and gave to Dr. Melanee Left.

## 2016-01-13 ENCOUNTER — Ambulatory Visit (HOSPITAL_COMMUNITY): Payer: Medicare Other

## 2016-01-13 ENCOUNTER — Encounter (HOSPITAL_COMMUNITY): Payer: Self-pay | Admitting: Cardiovascular Disease

## 2016-01-13 DIAGNOSIS — I10 Essential (primary) hypertension: Secondary | ICD-10-CM

## 2016-01-13 DIAGNOSIS — E785 Hyperlipidemia, unspecified: Secondary | ICD-10-CM

## 2016-01-13 LAB — CBC
HCT: 43.9 % (ref 39.0–52.0)
HEMATOCRIT: 44.4 % (ref 39.0–52.0)
HEMOGLOBIN: 14.2 g/dL (ref 13.0–17.0)
HEMOGLOBIN: 13.9 g/dL (ref 13.0–17.0)
MCH: 22.7 pg — ABNORMAL LOW (ref 26.0–34.0)
MCH: 22.8 pg — ABNORMAL LOW (ref 26.0–34.0)
MCHC: 32 g/dL (ref 30.0–36.0)
MCHC: 31.7 g/dL (ref 30.0–36.0)
MCV: 71 fL — AB (ref 78.0–100.0)
MCV: 72 fL — ABNORMAL LOW (ref 78.0–100.0)
PLATELETS: 241 10*3/uL (ref 150–400)
Platelets: 250 10*3/uL (ref 150–400)
RBC: 6.25 MIL/uL — ABNORMAL HIGH (ref 4.22–5.81)
RBC: 6.1 MIL/uL — AB (ref 4.22–5.81)
RDW: 16.1 % — ABNORMAL HIGH (ref 11.5–15.5)
RDW: 16.4 % — ABNORMAL HIGH (ref 11.5–15.5)
WBC: 11.2 10*3/uL — AB (ref 4.0–10.5)
WBC: 9.9 10*3/uL (ref 4.0–10.5)

## 2016-01-13 LAB — GLUCOSE, CAPILLARY
GLUCOSE-CAPILLARY: 111 mg/dL — AB (ref 65–99)
Glucose-Capillary: 108 mg/dL — ABNORMAL HIGH (ref 65–99)

## 2016-01-13 LAB — TROPONIN I
Troponin I: 44.48 ng/mL (ref <0.031)
Troponin I: >65 ng/mL (ref <0.031)

## 2016-01-13 LAB — LIPID PANEL
CHOL/HDL RATIO: 4.8 ratio
CHOLESTEROL: 157 mg/dL (ref 0–200)
HDL: 33 mg/dL — ABNORMAL LOW (ref >40)
LDL Cholesterol: 82 mg/dL (ref 0–99)
Triglycerides: 208 mg/dL — ABNORMAL HIGH (ref <150)
VLDL: 42 mg/dL — ABNORMAL HIGH (ref 0–40)

## 2016-01-13 LAB — BASIC METABOLIC PANEL
ANION GAP: 16 — AB (ref 5–15)
BUN: 15 mg/dL (ref 6–20)
CALCIUM: 9.3 mg/dL (ref 8.9–10.3)
CHLORIDE: 105 mmol/L (ref 101–111)
CO2: 20 mmol/L — AB (ref 22–32)
CREATININE: 1.63 mg/dL — AB (ref 0.61–1.24)
GFR calc non Af Amer: 38 mL/min — ABNORMAL LOW (ref >60)
GFR, EST AFRICAN AMERICAN: 45 mL/min — AB (ref >60)
Glucose, Bld: 120 mg/dL — ABNORMAL HIGH (ref 65–99)
Potassium: 4.5 mmol/L (ref 3.5–5.1)
SODIUM: 141 mmol/L (ref 135–145)

## 2016-01-13 LAB — BRAIN NATRIURETIC PEPTIDE: B NATRIURETIC PEPTIDE 5: 16.1 pg/mL (ref 0.0–100.0)

## 2016-01-13 LAB — CREATININE, SERUM
Creatinine, Ser: 1.83 mg/dL — ABNORMAL HIGH (ref 0.61–1.24)
GFR, EST AFRICAN AMERICAN: 39 mL/min — AB (ref >60)
GFR, EST NON AFRICAN AMERICAN: 33 mL/min — AB (ref >60)

## 2016-01-13 LAB — POCT ACTIVATED CLOTTING TIME: ACTIVATED CLOTTING TIME: 358 s

## 2016-01-13 MED ORDER — HEPARIN SODIUM (PORCINE) 5000 UNIT/ML IJ SOLN
5000.0000 [IU] | Freq: Three times a day (TID) | INTRAMUSCULAR | Status: DC
  Administered 2016-01-13 – 2016-01-15 (×7): 5000 [IU] via SUBCUTANEOUS
  Filled 2016-01-13 (×7): qty 1

## 2016-01-13 MED ORDER — AMLODIPINE BESYLATE 2.5 MG PO TABS
2.5000 mg | ORAL_TABLET | Freq: Every day | ORAL | Status: DC
  Administered 2016-01-13: 2.5 mg via ORAL
  Filled 2016-01-13: qty 1

## 2016-01-13 MED ORDER — SODIUM CHLORIDE 0.9 % IV SOLN
INTRAVENOUS | Status: AC
Start: 2016-01-13 — End: 2016-01-13
  Administered 2016-01-12: via INTRAVENOUS

## 2016-01-13 MED ORDER — INSULIN ASPART 100 UNIT/ML ~~LOC~~ SOLN: 0.0000 [IU] | Freq: Every day | SUBCUTANEOUS | Status: DC

## 2016-01-13 MED ORDER — NITROGLYCERIN 0.4 MG SL SUBL: 0.4000 mg | SUBLINGUAL_TABLET | SUBLINGUAL | Status: DC | PRN

## 2016-01-13 MED ORDER — TICAGRELOR 90 MG PO TABS
90.0000 mg | ORAL_TABLET | Freq: Two times a day (BID) | ORAL | Status: DC
  Administered 2016-01-13 – 2016-01-15 (×5): 90 mg via ORAL
  Filled 2016-01-13 (×5): qty 1

## 2016-01-13 MED ORDER — ACETAMINOPHEN 325 MG PO TABS: 650.0000 mg | ORAL_TABLET | ORAL | Status: DC | PRN

## 2016-01-13 MED ORDER — INSULIN ASPART 100 UNIT/ML ~~LOC~~ SOLN
0.0000 [IU] | Freq: Three times a day (TID) | SUBCUTANEOUS | Status: DC
  Administered 2016-01-13 – 2016-01-15 (×3): 2 [IU] via SUBCUTANEOUS

## 2016-01-13 MED ORDER — SODIUM CHLORIDE 0.9% FLUSH
3.0000 mL | Freq: Two times a day (BID) | INTRAVENOUS | Status: DC
  Administered 2016-01-13 – 2016-01-15 (×5): 3 mL via INTRAVENOUS

## 2016-01-13 MED ORDER — ASPIRIN 81 MG PO CHEW
81.0000 mg | CHEWABLE_TABLET | Freq: Every day | ORAL | Status: DC
  Administered 2016-01-13 – 2016-01-15 (×3): 81 mg via ORAL
  Filled 2016-01-13 (×3): qty 1

## 2016-01-13 MED ORDER — ONDANSETRON HCL 4 MG/2ML IJ SOLN: 4.0000 mg | Freq: Four times a day (QID) | INTRAMUSCULAR | Status: DC | PRN

## 2016-01-13 MED ORDER — AMLODIPINE BESYLATE 5 MG PO TABS
5.0000 mg | ORAL_TABLET | Freq: Every day | ORAL | Status: DC
  Administered 2016-01-14 – 2016-01-15 (×2): 5 mg via ORAL
  Filled 2016-01-13 (×2): qty 1

## 2016-01-13 MED ORDER — HYDRALAZINE HCL 20 MG/ML IJ SOLN
10.0000 mg | Freq: Four times a day (QID) | INTRAMUSCULAR | Status: DC | PRN
  Administered 2016-01-13: 10 mg via INTRAVENOUS
  Filled 2016-01-13: qty 1

## 2016-01-13 MED ORDER — DOCUSATE SODIUM 100 MG PO CAPS
100.0000 mg | ORAL_CAPSULE | Freq: Two times a day (BID) | ORAL | Status: DC
  Administered 2016-01-13 – 2016-01-15 (×4): 100 mg via ORAL
  Filled 2016-01-13 (×4): qty 1

## 2016-01-13 MED ORDER — MORPHINE SULFATE (PF) 2 MG/ML IV SOLN: 2.0000 mg | INTRAVENOUS | Status: DC | PRN

## 2016-01-13 MED ORDER — HYDRALAZINE HCL 25 MG PO TABS
25.0000 mg | ORAL_TABLET | Freq: Three times a day (TID) | ORAL | Status: DC
  Administered 2016-01-13 – 2016-01-14 (×3): 25 mg via ORAL
  Filled 2016-01-13 (×3): qty 1

## 2016-01-13 MED ORDER — SODIUM CHLORIDE 0.9% FLUSH
3.0000 mL | INTRAVENOUS | Status: DC | PRN
  Administered 2016-01-14: 3 mL via INTRAVENOUS
  Filled 2016-01-13: qty 3

## 2016-01-13 MED ORDER — SODIUM CHLORIDE 0.9 % IV SOLN
250.0000 mL | INTRAVENOUS | Status: DC | PRN
  Administered 2016-01-12: 250 mL via INTRAVENOUS

## 2016-01-13 MED ORDER — ATORVASTATIN CALCIUM 80 MG PO TABS
80.0000 mg | ORAL_TABLET | Freq: Every day | ORAL | Status: DC
  Administered 2016-01-13 – 2016-01-14 (×2): 80 mg via ORAL
  Filled 2016-01-13 (×2): qty 1

## 2016-01-13 MED ORDER — AMLODIPINE BESYLATE 2.5 MG PO TABS
2.5000 mg | ORAL_TABLET | Freq: Once | ORAL | Status: AC
  Administered 2016-01-13: 2.5 mg via ORAL
  Filled 2016-01-13: qty 1

## 2016-01-13 MED ORDER — METOPROLOL TARTRATE 25 MG PO TABS
25.0000 mg | ORAL_TABLET | Freq: Two times a day (BID) | ORAL | Status: DC
  Administered 2016-01-13 – 2016-01-15 (×6): 25 mg via ORAL
  Filled 2016-01-13 (×6): qty 1

## 2016-01-13 MED ORDER — TRAMADOL HCL 50 MG PO TABS: 50.0000 mg | ORAL_TABLET | Freq: Four times a day (QID) | ORAL | Status: DC | PRN

## 2016-01-13 NOTE — Progress Notes (Signed)
TR band deflated per policy. Removed at 03:15, folded gauze and tegaderm applied to site. No bleeding or hematoma formation noted, site remained a level 0 since admission to the unit and through out deflation process. Will continue to monitor.  Paulene Floor, RN 01/13/2016 7:02 AM

## 2016-01-13 NOTE — Progress Notes (Signed)
Entered in error

## 2016-01-13 NOTE — Progress Notes (Signed)
  Echocardiogram 2D Echocardiogram has been performed.  Joelene Millin 01/13/2016, 11:20 AM

## 2016-01-13 NOTE — Progress Notes (Signed)
SUBJECTIVE: patient feels better now. No longer having any SOB, diaphoresis, weakness or dizziness. No CP or other complaints.    BP 162/95 mmHg  Pulse 72  Temp(Src) 98.9 F (37.2 C) (Oral)  Resp 16  Ht 5\' 9"  (1.753 m)  Wt 114 kg (251 lb 5.2 oz)  BMI 37.10 kg/m2  SpO2 100%  Intake/Output Summary (Last 24 hours) at 01/13/16 [id number] Last data filed at 01/13/16 0800  Gross per 24 hour  Intake 1021.25 ml  Output   2600 ml  Net -1578.75 ml    PHYSICAL EXAM General: Well developed, well nourished, in no acute distress. Alert and oriented x 3.  Psych:  Good affect, responds appropriately Neck: No JVD. No masses noted.  Lungs: Clear bilaterally with no wheezes or rhonci noted.  Heart: RRR with no murmurs noted. Abdomen: Bowel sounds are present. Soft, non-tender.  Extremities: No lower extremity edema.   LABS: Basic Metabolic Panel:  Recent Labs  01/12/16 2120 01/13/16 0105 01/13/16 0655  NA 137  --  141  K 3.6  --  4.5  CL 105  --  105  CO2 23  --  20*  GLUCOSE 130*  --  120*  BUN 20  --  15  CREATININE 1.72* 1.83* 1.63*  CALCIUM 8.7*  --  9.3   CBC:  Recent Labs  01/13/16 0105 01/13/16 0655  WBC 11.2* 9.9  HGB 14.2 13.9  HCT 44.4 43.9  MCV 71.0* 72.0*  PLT 250 241   Cardiac Enzymes:  Recent Labs  01/12/16 2120 01/13/16 0105 01/13/16 0655  TROPONINI 0.05* 44.48* >65.00*   Fasting Lipid Panel:  Recent Labs  01/13/16 0105  CHOL 157  HDL 33*  LDLCALC 82  TRIG 208*  CHOLHDL 4.8    Current Meds: . aspirin  81 mg Oral Daily  . atorvastatin  80 mg Oral q1800  . heparin  5,000 Units Subcutaneous 3 times per day  . insulin aspart  0-15 Units Subcutaneous TID WC  . insulin aspart  0-5 Units Subcutaneous QHS  . metoprolol tartrate  25 mg Oral BID  . sodium chloride flush  3 mL Intravenous Q12H  . 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 Cath showed RCA 99% stenosis s/p DES.   Inferior lead STEMI - s/p DES to RCA. Feeling better. No complaints currently. EF was not assessed during cath 2/2 to his elevated crt, 2D ECHO pending. - f/up 2D Echo. Continue asa + brilinta for at least 12 months, cont metoprolol  - lipitor 80 - resume losartan as Crt is stable - f/up ECHO, may d/c home with outpatient follow up later today.   HTN - BP elevated - continue metoprolol low dose. Hard to titrate up b/c of his low HR (also had bradycardia in 30's initially likely 2/2 to his RCA infarct). - resume losartan as CKD stable.   Non insulin dep DM II - last hgba1c at PCP office 6.4 - on metformin outpatient. - hold metformin, continue SSI here  CKD III - b/l crt at PCP office 1.6-1.8,  here 1.63.   Dellia Nims  2/22/20178:53 AM  The patient was seen, examined and discussed with Dellia Nims, MD and I agree with the above.   Mr. Rentsch is a 81 yo man with PMH of T2DM and hypertension who presented with chest pain and found to have inferior STEMI now s/p PCI to distal RCA. We will continue DAPT with ASA/ticagrelor, metoprolol, atorvastatin. We will add amlodipine 2.5 mg po daily. Hold losartan for now as Crea 1.5 and post contrast. He is chest pain free. Telemetry shows short runs of nsVTs - reperfusion arrhythmias. Hb is stable, groin looks good, no bleeding or bruits.  We will transfer to telemetry and discharge tomorrow if otherwise no change. Refer to cardiac rehab at discharge.   Dorothy Spark 01/13/2016

## 2016-01-13 NOTE — Progress Notes (Signed)
[place] regarding PT continued elevated BP post extra 2.5 norvasc dose @ 1500. New orders received. Pt to remain on ICU for close BP monitoring. PO hydralazine given per order. BP 179/92. Will give PRN 10mg  IV hydralazine dose. Will continue to monitor. Pt remains asymptomatic.

## 2016-01-13 NOTE — Progress Notes (Signed)
CARDIAC REHAB PHASE I   PRE:  Rate/Rhythm: 75 SR  BP:  Supine:   Sitting: 152/91  Standing:    SaO2:   MODE:  Ambulation: 350 ft   POST:  Rate/Rhythm: 78 SR  BP:  Supine:   Sitting: 179/80  Standing:    SaO2:  1100-1200 Pt walked 350 ft with asst x 1 with steady gait. Tolerated without CP. BP elevated and RN aware. MI education completed with pt and sister who voiced understanding. Stressed importance of brilinta with stent. Has brilinta card. Discussed carb counting and gave diabetic and heart healthy diets. Discussed NTG use, MI restrictions, ex ed. Referring to [facility] Phase 2. To recliner after walk.    Graylon Good, RN BSN  01/13/2016 11:54 AM

## 2016-01-13 NOTE — Care Management Note (Addendum)
Case Management Note  Patient Details  Name: Darrell Marshall MRN: [id number] Date of Birth: Apr 21, 1935  Subjective/Objective:    Adm w mi                Action/Plan: lives w fam, pcp dr Luan Pulling   Expected Discharge Date:                  Expected Discharge Plan:  Home/Self Care  In-House Referral:     Discharge planning Services  CM Consult, Medication Assistance  Post Acute Care Choice:    Choice offered to:     DME Arranged:    DME Agency:     HH Arranged:    HH Agency:     Status of Service:     Medicare Important Message Given:    Date Medicare IM Given:    Medicare IM give by:    Date Additional Medicare IM Given:    Additional Medicare Important Message give by:     If discussed at [place] of Stay Meetings, dates discussed:    Additional Comments: ur review done, gave pt 30day free brilinta card. Brilinta: in deductible stage $170 deductible, $157.75 remaining/ auth not required/ tier 3/ once deductible is met co-pay would be $45   Lacretia Leigh, RN 01/13/2016, 10:11 AM

## 2016-01-14 DIAGNOSIS — I255 Ischemic cardiomyopathy: Secondary | ICD-10-CM

## 2016-01-14 DIAGNOSIS — I110 Hypertensive heart disease with heart failure: Secondary | ICD-10-CM

## 2016-01-14 LAB — GLUCOSE, CAPILLARY
GLUCOSE-CAPILLARY: 131 mg/dL — AB (ref 65–99)
GLUCOSE-CAPILLARY: 116 mg/dL — AB (ref 65–99)
GLUCOSE-CAPILLARY: 103 mg/dL — AB (ref 65–99)
Glucose-Capillary: 128 mg/dL — ABNORMAL HIGH (ref 65–99)
Glucose-Capillary: 91 mg/dL (ref 65–99)

## 2016-01-14 LAB — MRSA PCR SCREENING: MRSA by PCR: NEGATIVE

## 2016-01-14 LAB — HEMOGLOBIN A1C
HEMOGLOBIN A1C: 6.4 % — AB (ref 4.8–5.6)
Mean Plasma Glucose: 137 mg/dL

## 2016-01-14 MED ORDER — ZINC SULFATE 220 (50 ZN) MG PO CAPS
220.0000 mg | ORAL_CAPSULE | Freq: Every day | ORAL | Status: DC
  Administered 2016-01-15: 220 mg via ORAL
  Filled 2016-01-14: qty 1

## 2016-01-14 MED ORDER — VITAMIN C 500 MG PO TABS
1000.0000 mg | ORAL_TABLET | Freq: Every day | ORAL | Status: DC
  Administered 2016-01-14 – 2016-01-15 (×2): 1000 mg via ORAL
  Filled 2016-01-14 (×2): qty 2

## 2016-01-14 MED ORDER — LOSARTAN POTASSIUM 50 MG PO TABS
50.0000 mg | ORAL_TABLET | Freq: Every day | ORAL | Status: DC
  Administered 2016-01-14 – 2016-01-15 (×2): 50 mg via ORAL
  Filled 2016-01-14 (×2): qty 1

## 2016-01-14 NOTE — Progress Notes (Signed)
Attempted to call report, RN to call back when available.

## 2016-01-14 NOTE — Progress Notes (Signed)
CARDIAC REHAB PHASE I   PRE:  Rate/Rhythm: 69 SR  BP:  Supine:   Sitting: 156/87  Standing:    SaO2:   MODE:  Ambulation: 740 ft   POST:  Rate/Rhythm: 84 SR  BP:  Supine:   Sitting: 173/104, 173/106  Standing:    SaO2:  1255-1320 Pt walked 740 ft on RA with asst x 1 with steady gait. Tolerated well. No CP. To recliner after walk. No questions re ed done yesterday.   Graylon Good, RN BSN  01/14/2016 1:16 PM

## 2016-01-14 NOTE — Progress Notes (Signed)
Report called to 3W- Kristy

## 2016-01-14 NOTE — Progress Notes (Signed)
SUBJECTIVE: patient feels ok, no chest pain, no dizziness, nausea, minimal SOB, he walked without difficulties yesterday.  BP 163/105 mmHg  Pulse 72  Temp(Src) 97.5 F (36.4 C) (Oral)  Resp 18  Ht 5\' 9"  (1.753 m)  Wt 251 lb 5.2 oz (114 kg)  BMI 37.10 kg/m2  SpO2 98%  Intake/Output Summary (Last 24 hours) at 01/14/16 0830 Last data filed at 01/14/16 0500  Gross per 24 hour  Intake    660 ml  Output   2275 ml  Net  -1615 ml    PHYSICAL EXAM General: Well developed, well nourished, in no acute distress. Alert and oriented x 3.  Psych:  Good affect, responds appropriately Neck: No JVD. No masses noted.  Lungs: Clear bilaterally with no wheezes or rhonci noted.  Heart: RRR with no murmurs noted. Abdomen: Bowel sounds are present. Soft, non-tender.  Extremities: No lower extremity edema.   LABS: Basic Metabolic Panel:  Recent Labs  01/12/16 2120 01/13/16 0105 01/13/16 0655  NA 137  --  141  K 3.6  --  4.5  CL 105  --  105  CO2 23  --  20*  GLUCOSE 130*  --  120*  BUN 20  --  15  CREATININE 1.72* 1.83* 1.63*  CALCIUM 8.7*  --  9.3   CBC:  Recent Labs  01/13/16 0105 01/13/16 0655  WBC 11.2* 9.9  HGB 14.2 13.9  HCT 44.4 43.9  MCV 71.0* 72.0*  PLT 250 241   Cardiac Enzymes:  Recent Labs  01/13/16 0105 01/13/16 0655 01/13/16 1230  TROPONINI 44.48* >65.00* >65.00*   Fasting Lipid Panel:  Recent Labs  01/13/16 0105  CHOL 157  HDL 33*  LDLCALC 82  TRIG 208*  CHOLHDL 4.8    Current Meds: . amLODipine  2.5 mg Oral Daily  . amLODipine  5 mg Oral Daily  . aspirin  81 mg Oral Daily  . atorvastatin  80 mg Oral q1800  . docusate sodium  100 mg Oral BID  . heparin  5,000 Units Subcutaneous 3 times per day  . hydrALAZINE  25 mg Oral 3 times per day  . insulin aspart  0-15 Units Subcutaneous TID WC  . insulin aspart  0-5 Units Subcutaneous QHS  . metoprolol tartrate  25 mg Oral BID  . sodium chloride flush  3 mL Intravenous Q12H  . ticagrelor   90 mg Oral BID   Telemetry: nsVT 14 beats at 2: 30 pm yesterday  TTE; 01/13/2016 Left ventricle: The cavity size was normal. There was moderate concentric hypertrophy. Systolic function was mildly to moderately reduced. The estimated ejection fraction was in the range of 40% to 45%. There is akinesis of the basal-midinferolateral myocardium. There is hypokinesis of the basal-midinferoseptal myocardium. There is akinesis of the basal-midinferior myocardium. There was an increased relative contribution of atrial contraction to ventricular filling. Doppler parameters are consistent with abnormal left ventricular relaxation (grade 1 diastolic dysfunction). - Aortic valve: Trileaflet; mildly thickened, mildly calcified leaflets. There was mild regurgitation.    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 Cath showed RCA 99% stenosis s/p  DES.   Inferior lead STEMI - s/p DES to RCA. Feeling better. No complaints currently. EF was not assessed during cath 2/2 to his elevated crt, 2D ECHO pending. - f/up 2D Echo. Continue asa + brilinta for at least 12 months, cont metoprolol  - lipitor 80 - resume losartan as Crt is stable - f/up ECHO, may d/c home with outpatient follow up later today.   nsVT - reperfusion arrhythmia - he is asymptomatic with it, on metoprolol. Unable to uptitrate as he is bradycardic at baseline, LVEF 40-45%  Ischemic cardiomyopathy - add losartan, follow up crea, baseline 18\.8, yesterday 1.6  HTN - BP elevated - continue metoprolol low dose. - resume losartan  Non insulin dep  DM II - last hgba1c at PCP office 6.4 - on metformin outpatient. - hold metformin, continue SSI here  CKD III - b/l crt at PCP office 1.6-1.8, here 1.63.   Transfer to telemetry as he continues to be hypertensive and has ns VTs. Anticipated discharge tomorrow.   Dorothy Spark 01/14/2016

## 2016-01-15 ENCOUNTER — Telehealth: Payer: Self-pay | Admitting: Cardiovascular Disease

## 2016-01-15 ENCOUNTER — Encounter (HOSPITAL_COMMUNITY): Payer: Self-pay | Admitting: Physician Assistant

## 2016-01-15 DIAGNOSIS — N184 Chronic kidney disease, stage 4 (severe): Secondary | ICD-10-CM

## 2016-01-15 DIAGNOSIS — I255 Ischemic cardiomyopathy: Secondary | ICD-10-CM | POA: Diagnosis present

## 2016-01-15 DIAGNOSIS — N183 Chronic kidney disease, stage 3 unspecified: Secondary | ICD-10-CM

## 2016-01-15 DIAGNOSIS — I2510 Atherosclerotic heart disease of native coronary artery without angina pectoris: Secondary | ICD-10-CM | POA: Diagnosis present

## 2016-01-15 DIAGNOSIS — E1129 Type 2 diabetes mellitus with other diabetic kidney complication: Secondary | ICD-10-CM

## 2016-01-15 DIAGNOSIS — I10 Essential (primary) hypertension: Secondary | ICD-10-CM | POA: Diagnosis present

## 2016-01-15 DIAGNOSIS — Z9861 Coronary angioplasty status: Secondary | ICD-10-CM

## 2016-01-15 HISTORY — DX: Chronic kidney disease, stage 3 unspecified: N18.30

## 2016-01-15 LAB — GLUCOSE, CAPILLARY
Glucose-Capillary: 124 mg/dL — ABNORMAL HIGH (ref 65–99)
Glucose-Capillary: 113 mg/dL — ABNORMAL HIGH (ref 65–99)

## 2016-01-15 MED ORDER — TICAGRELOR 90 MG PO TABS: 90.0000 mg | ORAL_TABLET | Freq: Two times a day (BID) | ORAL | Status: DC

## 2016-01-15 MED ORDER — AMLODIPINE BESYLATE 10 MG PO TABS: 10.0000 mg | ORAL_TABLET | Freq: Every day | ORAL | Status: DC

## 2016-01-15 MED ORDER — ATORVASTATIN CALCIUM 80 MG PO TABS: 80.0000 mg | ORAL_TABLET | Freq: Every day | ORAL | Status: DC

## 2016-01-15 MED ORDER — LOSARTAN POTASSIUM 50 MG PO TABS: 50.0000 mg | ORAL_TABLET | Freq: Every day | ORAL | Status: DC

## 2016-01-15 MED ORDER — ASPIRIN 81 MG PO CHEW: 81.0000 mg | CHEWABLE_TABLET | Freq: Every day | ORAL | Status: AC

## 2016-01-15 MED ORDER — METOPROLOL TARTRATE 25 MG PO TABS: 25.0000 mg | ORAL_TABLET | Freq: Two times a day (BID) | ORAL | Status: DC

## 2016-01-15 MED ORDER — NITROGLYCERIN 0.4 MG SL SUBL: 0.4000 mg | SUBLINGUAL_TABLET | SUBLINGUAL | Status: AC | PRN

## 2016-01-15 NOTE — Plan of Care (Signed)
Problem: Skin Integrity: Goal: Risk for impaired skin integrity will decrease Outcome: Not Applicable Date Met:  01/15/16 Patient Braden score greater than 18.  Patient able to reposition self in bed and can get in and out of bed unassisted.       

## 2016-01-15 NOTE — Discharge Summary (Signed)
Discharge Summary    Patient ID: Darrell Marshall,  MRN: [id number], DOB/AGE: 79/09/1935 79 y.o.  Admit date: 01/12/2016 Discharge date: 01/15/2016  Primary Care Provider: Alonza Marshall Primary Cardiologist: Dr. Burt Marshall  Discharge Diagnoses    Principal Problem:   ST elevation myocardial infarction (STEMI) of inferior wall [facility]) Active Problems:   Hypertension   Diabetes mellitus without complication ([place])   CKD (chronic kidney disease), stage III   CAD (coronary artery disease), native coronary artery   Ischemic cardiomyopathy   Allergies No Known Allergies  Diagnostic Studies/Procedures    Cath 01/12/2016 Conclusion     Ost 1st Diag to 1st Diag lesion, 50% stenosed.  Prox Cx to Mid Cx lesion, 60% stenosed.  Dist RCA lesion, 100% stenosed. Post intervention, there is a 0% residual stenosis.  1. Total occlusion of the right coronary artery in the setting of inferior STEMI, treated with primary PCI using a drug-eluting stent 2. Widely patent left mainstem 3. Minor nonobstructive disease in the LAD with moderate stenosis of the first diagonal branch 4. Moderate stenosis of the left circumflex  Recommendation: Medical therapy for her residual CAD. Aspirin and brilinta at least 12 months as tolerated. Post MI medical therapy. Will assess LV function with echo because of chronic kidney disease. Anticipate hospital discharge in 48 hours if no complications.     Echo 01/13/2016 LV EF: 40% -  45%  ------------------------------------------------------------------- Indications:   MI - acute 410.91.  ------------------------------------------------------------------- History:  PMH: No prior cardiac history.  ------------------------------------------------------------------- Study Conclusions  - Left ventricle: The cavity size was normal. There was moderate concentric hypertrophy. Systolic function was mildly to moderately reduced. The estimated  ejection fraction was in the range of 40% to 45%. There is akinesis of the basal-midinferolateral myocardium. There is hypokinesis of the basal-midinferoseptal myocardium. There is akinesis of the basal-midinferior myocardium. There was an increased relative contribution of atrial contraction to ventricular filling. Doppler parameters are consistent with abnormal left ventricular relaxation (grade 1 diastolic dysfunction). - Aortic valve: Trileaflet; mildly thickened, mildly calcified leaflets. There was mild regurgitation.   _____________   History of Present Illness     Darrell Marshall is a 81 yo man with PMH of T2DM on metformin and hypertension who presented to [facility] ER with acute onset of dyspnea around 4:00 pm this afternoon. 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 He subsequently noted some central chest discomfort along with associated nausea, diaphoresis and dizziness. He previous lived/worked in [place] for [employer] for 33 years and now works part-time a [employer]. He is active and has no limitations. No previous chest pain or syncope. His parents and siblings did not have CAD. At [facility] a repeat ECG revealed inferior STEMI with bradycardia. Per report he was bradycardic as low as 30s and received atropine with improved HR. He received aspirin 325 mg and heparin 4000 units and was transferred to [facility]. ON arrival, he was immediately taken to the cath lab and he said his chest discomfort was as severe as 8/10.   Left coronary artery with nonobstructive disease and RCA with distal 99% thought to be culprit. He had brief ventricular fibrillation episode that was treated with defibrillation engaging/wiring the RCA and he subsequently received a DES.    Hospital Course     He was taken urgently to cardiac catheterization on 01/12/2016 which showed 50% ostial D1 stenosis, 60% proximal left circumflex  stenosis, 100% distal RCA lesion treated with a drug-eluting stent. Postprocedure, he was placed on aspirin and Brilinta. Echocardiogram obtained on 01/13/2016 showed EF 40-45%, akinesis of basal mid inferior lateral myocardium, hypokinesis of the basal mid inferoseptal myocardium, akinesis of basal mid inferior myocardium, grade 1 diastolic dysfunction, mild AR. Postprocedure, her troponin increased to greater than 65. Lipid panel obtained on 2/22 showed total cholesterol 157, triglyceride 208, HDL 33, LDL 82. Hemoglobin A1c was 6.4.   He was seen by cardiac rehabilitation on 2/23 and ambulated 740 feet without any chest discomfort. He was seen in the morning of 01/15/2016, he did have some bradycardia, however his creatinine is stable post cath. He did have some nonsustained VT. I have discussed with him the need to be compliant with DAPT.   _____________  Discharge Vitals Blood pressure 169/88, pulse 79, temperature 97.8 F (36.6 C), temperature source Oral, resp. rate 18, height 5\' 9"  (1.753 m), weight 143 lb 4.8 oz (65 kg), SpO2 99 %.  Filed Weights   01/12/16 2051 01/12/16 2345 01/15/16 0407  Weight: 254 lb (115.214 kg) 251 lb 5.2 oz (114 kg) 143 lb 4.8 oz (65 kg)    Labs & Radiologic Studies     CBC  Recent Labs  01/13/16 0105 01/13/16 0655  WBC 11.2* 9.9  HGB 14.2 13.9  HCT 44.4 43.9  MCV 71.0* 72.0*  PLT 250 [id number]   Basic Metabolic Panel  Recent Labs  01/12/16 2120 01/13/16 0105 01/13/16 0655  NA 137  --  141  K 3.6  --  4.5  CL 105  --  105  CO2 23  --  20*  GLUCOSE 130*  --  120*  BUN 20  --  15  CREATININE 1.72* 1.83* 1.63*  CALCIUM 8.7*  --  9.3   Cardiac Enzymes  Recent Labs  01/13/16 0105 01/13/16 0655 01/13/16 1230  TROPONINI 44.48* >65.00* >65.00*   Hemoglobin A1C  Recent Labs  01/13/16 0105  HGBA1C 6.4*   Fasting Lipid Panel  Recent Labs  01/13/16 0105  CHOL 157  HDL 33*  LDLCALC 82  TRIG 208*  CHOLHDL 4.8     Disposition   Pt  is being discharged home today in good condition.  Follow-up Plans & Appointments    Follow-up Information    Follow up with Darrell Quan, PA-C On 01/22/2016.   Specialties:  Cardiology, Radiology   Why:  Cardiology Followup. 10:00AM   Contact information:   [address] [address] [phone]      Discharge Instructions    Amb Referral to Cardiac Rehabilitation    Complete by:  As directed   Diagnosis:   Myocardial Infarction PCI             Discharge Medications   Discharge Medication List as of 01/15/2016  1:57 PM    START taking these medications   Details  amLODipine (NORVASC) 10 MG tablet Take 1 tablet (10 mg total) by mouth daily., Starting 01/16/2016, Until Discontinued, Normal    aspirin 81 MG chewable tablet Chew 1 tablet (81 mg total) by mouth daily., Starting 01/15/2016, Until Discontinued, OTC    atorvastatin (LIPITOR) 80 MG tablet Take 1 tablet (80 mg total) by mouth daily at 6 PM., Starting 01/15/2016, Until Discontinued, Normal    metoprolol tartrate (LOPRESSOR) 25 MG tablet Take 1 tablet (25 mg total) by mouth 2 (two) times daily., Starting 01/15/2016, Until Discontinued, Normal    nitroGLYCERIN (NITROSTAT) 0.4 MG  SL tablet Place 1 tablet (0.4 mg total) under the tongue every 5 (five) minutes x 3 doses as needed for chest pain., Starting 01/15/2016, Until Discontinued, Normal      CONTINUE these medications which have CHANGED   Details  losartan (COZAAR) 50 MG tablet Take 1 tablet (50 mg total) by mouth daily., Starting 01/15/2016, Until Discontinued, Normal    ticagrelor (BRILINTA) 90 MG TABS tablet Take 1 tablet (90 mg total) by mouth 2 (two) times daily., Starting 01/15/2016, Until Discontinued, Normal      CONTINUE these medications which have NOT CHANGED   Details  famotidine (PEPCID) 40 MG tablet Take 40 mg by mouth daily., Starting 01/16/2014, Until Discontinued, Historical Med    metFORMIN (GLUCOPHAGE) 500 MG tablet Take 500  mg by mouth 2 (two) times daily., Starting 02/21/2014, Until Discontinued, Historical Med    traMADol (ULTRAM) 50 MG tablet Take 1 tablet (50 mg total) by mouth every 6 (six) hours as needed., Starting 03/03/2014, Until Discontinued, Print    vitamin C (ASCORBIC ACID) 500 MG tablet Take 1,000 mg by mouth daily., Until Discontinued, Historical Med    zinc gluconate 50 MG tablet Take 50 mg by mouth daily., Until Discontinued, Historical Med      STOP taking these medications     diltiazem (DILACOR XR) 180 MG 24 hr capsule          Aspirin prescribed at discharge?  Yes High Intensity Statin Prescribed? (Lipitor 40-80mg  or Crestor 20-40mg ): Yes Beta Blocker Prescribed? Yes For EF 45% or less, Was ACEI/ARB Prescribed? Yes ADP Receptor Inhibitor Prescribed? (i.e. Plavix etc.-Includes Medically Managed Patients): Yes Was EF assessed during THIS hospitalization? Yes Was Cardiac Rehab II ordered? (Included Medically managed Patients): Yes   Outstanding Labs/Studies   None  Duration of Discharge Encounter   Greater than 30 minutes including physician time.  Hilbert Corrigan PA-C 01/15/2016, 4:38 PM

## 2016-01-15 NOTE — Plan of Care (Signed)
Problem: Activity: Goal: Ability to tolerate increased activity will improve Outcome: Progressing Per note(s) patient seen by cardiac rehab yesterday.  Patient is up per self in room, has denied pain and shortness of breath thus far this shift

## 2016-01-15 NOTE — Progress Notes (Signed)
SUBJECTIVE: patient feels ok, no chest pain, no dizziness, nausea, minimal SOB, he walked without difficulties yesterday. No pain in the right groin area.  BP 169/88 mmHg  Pulse 79  Temp(Src) 97.8 F (36.6 C) (Oral)  Resp 18  Ht 5\' 9"  (1.753 m)  Wt 143 lb 4.8 oz (65 kg)  BMI 21.15 kg/m2  SpO2 99%  Intake/Output Summary (Last 24 hours) at 01/15/16 1030 Last data filed at 01/15/16 0600  Gross per 24 hour  Intake    880 ml  Output   1600 ml  Net   -720 ml   PHYSICAL EXAM General: Well developed, well nourished, in no acute distress. Alert and oriented x 3.  Psych:  Good affect, responds appropriately Neck: No JVD. No masses noted.  Lungs: Clear bilaterally with no wheezes or rhonci noted.  Heart: RRR with no murmurs noted. Abdomen: Bowel sounds are present. Soft, non-tender.  Extremities: No lower extremity edema.   LABS: Basic Metabolic Panel:  Recent Labs  01/12/16 2120 01/13/16 0105 01/13/16 0655  NA 137  --  141  K 3.6  --  4.5  CL 105  --  105  CO2 23  --  20*  GLUCOSE 130*  --  120*  BUN 20  --  15  CREATININE 1.72* 1.83* 1.63*  CALCIUM 8.7*  --  9.3   CBC:  Recent Labs  01/13/16 0105 01/13/16 0655  WBC 11.2* 9.9  HGB 14.2 13.9  HCT 44.4 43.9  MCV 71.0* 72.0*  PLT 250 241   Cardiac Enzymes:  Recent Labs  01/13/16 0105 01/13/16 0655 01/13/16 1230  TROPONINI 44.48* >65.00* >65.00*   Fasting Lipid Panel:  Recent Labs  01/13/16 0105  CHOL 157  HDL 33*  LDLCALC 82  TRIG 208*  CHOLHDL 4.8    Current Meds: . amLODipine  5 mg Oral Daily  . aspirin  81 mg Oral Daily  . atorvastatin  80 mg Oral q1800  . docusate sodium  100 mg Oral BID  . heparin  5,000 Units Subcutaneous 3 times per day  . insulin aspart  0-15 Units Subcutaneous TID WC  . insulin aspart  0-5 Units Subcutaneous QHS  . losartan  50 mg Oral Daily  . metoprolol tartrate  25 mg Oral BID  . sodium chloride flush  3 mL Intravenous Q12H  . ticagrelor  90 mg Oral BID    . vitamin C  1,000 mg Oral Daily  . zinc sulfate  220 mg Oral Daily   Telemetry: nsVT 14 beats at 2: 30 pm yesterday  TTE; 01/13/2016 Left ventricle: The cavity size was normal. There was moderate concentric hypertrophy. Systolic function was mildly to moderately reduced. The estimated ejection fraction was in the range of 40% to 45%. There is akinesis of the basal-midinferolateral myocardium. There is hypokinesis of the basal-midinferoseptal myocardium. There is akinesis of the basal-midinferior myocardium. There was an increased relative contribution of atrial contraction to ventricular filling. Doppler parameters are consistent with abnormal left ventricular relaxation (grade 1 diastolic dysfunction). - Aortic valve: Trileaflet; mildly thickened, mildly calcified leaflets. There was mild regurgitation.    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 Cath showed RCA 99% stenosis s/p DES.   Inferior lead STEMI - s/p DES to RCA. Feeling better. No complaints currently. EF was not assessed during cath 2/2 to his elevated crt, 2D ECHO pending. - f/up 2D Echo. Continue asa + brilinta for at least 12 months, cont metoprolol  - lipitor 80 - resume losartan as Crt is stable - f/up ECHO, may d/c home with outpatient follow up later today.   nsVT - reperfusion arrhythmia - he is asymptomatic with it, on metoprolol. Unable to uptitrate as he is bradycardic at baseline, LVEF 40-45%  Ischemic cardiomyopathy - added losartan, follow up crea, baseline 18\.8, today 1.6.  HTN - BP elevated - continue  metoprolol low dose. - add losartan - increase amlodipine to 10 mg po daily  Non insulin dep DM II - last hgba1c at PCP office 6.4 - on metformin outpatient. - hold metformin, continue SSI here  CKD III - b/l crt at PCP office 1.6-1.8, here 1.63.   Discharge today, we will arrange for an outpatient follow up.    Dorothy Spark 01/15/2016

## 2016-01-15 NOTE — Telephone Encounter (Signed)
TCM per Isaac Laud 3/3 @ 10am w/ Lurena Joiner

## 2016-01-15 NOTE — Evaluation (Signed)
Physical Therapy Evaluation Patient Details Name: Darrell Marshall MRN: [id number] DOB: [birth year] Today's Date: 01/15/2016   History of Present Illness  Pt is a 81 y/o M who presented w/ SOB, sweating, weakness, dizziness found to have STEMI and emergent cath performed.  Pt's PMH inlcudes hypertension. DM, and CKD stage III.    Clinical Impression  Pt admitted with above diagnosis. Darrell Marshall is at his baseline level of functioning and did not demonstrate any instability w/ high level balance activities.  He is planning to d/c home today.  No skilled PT needs identified.  Pt is signing off.     Follow Up Recommendations No PT follow up    Equipment Recommendations  None recommended by PT    Recommendations for Other Services       Precautions / Restrictions Precautions Precautions: None Restrictions Weight Bearing Restrictions: No      Mobility  Bed Mobility Overal bed mobility: Independent             General bed mobility comments: No assist or cues needed  Transfers Overall transfer level: Independent Equipment used: None             General transfer comment: No assist or cues needed  Ambulation/Gait Ambulation/Gait assistance: Modified independent (Device/Increase time) Ambulation Distance (Feet): 300 Feet Assistive device: None Gait Pattern/deviations: Step-through pattern;Trunk flexed   Gait velocity interpretation: at or above normal speed for age/gender General Gait Details: Pt w/ kyphotic posture and hold hands behind his back.  No instability noted w/ high level balance activities.    Stairs            Wheelchair Mobility    Modified Rankin (Stroke Patients Only)       Balance Overall balance assessment: Independent Sitting-balance support: No upper extremity supported;Feet supported Sitting balance-Leahy Scale: Normal     Standing balance support: No upper extremity supported;During functional activity Standing balance-Leahy  Scale: Good               High level balance activites: Side stepping;Backward walking;Direction changes;Turns;Head turns;Other (comment) (stepping over obstacles) High Level Balance Comments: No instability noted w/ high level balance activities             Pertinent Vitals/Pain Pain Assessment: No/denies pain    Home Living Family/patient expects to be discharged to:: Private residence Living Arrangements: Children Available Help at Discharge: Family;Available PRN/intermittently Type of Home: House Home Access: Stairs to enter Entrance Stairs-Rails: Left Entrance Stairs-Number of Steps: 2 Home Layout: One level Home Equipment: None      Prior Function Level of Independence: Independent         Comments: still driving, does not use AD     Hand Dominance        Extremity/Trunk Assessment   Upper Extremity Assessment: Overall WFL for tasks assessed           Lower Extremity Assessment: Overall WFL for tasks assessed      Cervical / Trunk Assessment: Kyphotic  Communication   Communication: No difficulties  Cognition Arousal/Alertness: Awake/alert Behavior During Therapy: WFL for tasks assessed/performed Overall Cognitive Status: Within Functional Limits for tasks assessed                      General Comments      Exercises        Assessment/Plan    PT Assessment Patent does not need any further PT services  PT Diagnosis Difficulty walking   PT Problem  List    PT Treatment Interventions     PT Goals (Current goals can be found in the Care Plan section) Acute Rehab PT Goals Patient Stated Goal: to go home today    Frequency     Barriers to discharge        Co-evaluation               End of Session Equipment Utilized During Treatment: Gait belt Activity Tolerance: Patient tolerated treatment well Patient left: in chair;with call bell/phone within reach;with family/visitor present Nurse Communication: Mobility  status         Time: [id number] PT Time Calculation (min) (ACUTE ONLY): 13 min   Charges:   PT Evaluation $PT Eval Low Complexity: 1 Procedure     PT G Codes:       Darrell Marshall PT, DPT [phone] Pager: [phone] 01/15/2016, 2:33 PM

## 2016-01-15 NOTE — Plan of Care (Signed)
Problem: Education: Goal: Knowledge of [facility] Education information/materials will improve Outcome: Progressing Patient aware of plan of care.  Patient has denied pain thus far this shift.  Patient aware to notify RN if pain statu changes.  RN provided medication education on all medications administered thus far this shift.  Patient stated understanding.

## 2016-01-15 NOTE — Discharge Instructions (Signed)
Acute Coronary Syndrome °Acute coronary syndrome (ACS) is a serious problem in which there is suddenly not enough blood and oxygen supplied to the heart. ACS may mean that one or more of the blood vessels in your heart (coronary arteries) may be blocked. ACS can result in chest pain or a heart attack (myocardial infarction or MI). °CAUSES °This condition is caused by atherosclerosis, which is the buildup of fat and cholesterol (plaque) on the inside of the arteries. Over time, the plaque may narrow or block the artery, and this will lessen blood flow to the heart. Plaque can also become weak and break off within a coronary artery to form a clot and cause a sudden blockage. °RISK FACTORS °The risks factors of this condition include: °· High cholesterol levels. °· High blood pressure (hypertension). °· Smoking. °· Diabetes. °· Age. °· Family history of chest pain, heart disease, or stroke. °· Lack of exercise. °SYMPTOMS °The most common signs of this condition include: °· Chest pain, which can be: °¨ A crushing or squeezing in the chest. °¨ A tightness, pressure, fullness, or heaviness in the chest. °¨ Present for more than a few minutes, or it can stop and recur. °· Pain in the arms, neck, jaw, or back. °· Unexplained heartburn or indigestion. °· Shortness of breath. °· Nausea. °· Sudden cold sweats. °· Feeling light-headed or dizzy. °Sometimes, this condition has no symptoms. °DIAGNOSIS °ACS may be diagnosed through the following tests: °· Electrocardiogram (ECG). °· Blood tests. °· Coronary angiogram. This is a procedure to look at the coronary arteries to see if there is any blockage. °TREATMENT °Treatment for ACS may include: °· Healthy behavioral changes to reduce or control risk factors. °· Medicine. °· Coronary stenting. A stent helps to keep an artery open. °· Coronary angioplasty. This procedure widens a narrowed or blocked artery. °· Coronary artery bypass surgery. This will allow your blood to pass the  blockage (bypass) to reach your heart. °HOME CARE INSTRUCTIONS °Eating and Drinking °· Follow a heart-healthy diet. A dietitian can you help to educate you about healthy food options and changes. °· Use healthy cooking methods such as roasting, grilling, broiling, baking, poaching, steaming, or stir-frying. Talk to a dietitian to learn more about healthy cooking methods. °Medicines °· Take medicines only as directed by your health care provider. °· Do not take the following medicines unless your health care provider approves: °¨ Nonsteroidal anti-inflammatory drugs (NSAIDs), such as ibuprofen, naproxen, or celecoxib. °¨ Vitamin supplements that contain vitamin A, vitamin E, or both. °¨ Hormone replacement therapy that contains estrogen with or without progestin. °· Stop illegal drug use. °Activities °· Follow an exercise program that is approved by your health care provider. °· Plan rest periods when you are fatigued. °Lifestyle °· Do not use any tobacco products, including cigarettes, chewing tobacco, or electronic cigarettes. If you need help quitting, ask your health care provider. °· If you drink alcohol, and your health care provider approves, limit your alcohol intake to no more than 1 drink per day. One drink equals 12 ounces of beer, 5 ounces of wine, or 1½ ounces of hard liquor. °· Learn to manage stress. °· Maintain a healthy weight. Lose weight as approved by your health care provider. °General Instructions °· Manage other health conditions, such as hypertension and diabetes, as directed by your health care provider. °· Keep all follow-up visits as directed by your health care provider. This is important. °· Your health care provider may ask you to monitor your blood   pressure. A blood pressure reading consists of a higher number over a lower number, such as 110 over 72, written as 110/72. Ideally, your blood pressure should be: °¨ Below 140/90 if you have no other medical conditions. °¨ Below 130/80 if  you have diabetes or kidney disease. °SEEK IMMEDIATE MEDICAL CARE IF: °· You have pain in your chest, neck, arm, jaw, stomach, or back that lasts more than a few minutes, is recurring, or is not relieved by taking medicine under your tongue (sublingual nitroglycerin). °· You have profuse sweating without cause. °· You have unexplained: °¨ Heartburn or indigestion. °¨ Shortness of breath or difficulty breathing. °¨ Nausea or vomiting. °¨ Fatigue. °¨ Feelings of nervousness or anxiety. °¨ Weakness. °¨ Diarrhea. °· You have sudden light-headedness or dizziness. °· You faint. °These symptoms may represent a serious problem that is an emergency. Do not wait to see if the symptoms will go away. Get medical help right away. Call your local emergency services (911 in the [place] Do not drive yourself to the clinic or hospital. °  °This information is not intended to replace advice given to you by your health care provider. Make sure you discuss any questions you have with your health care provider. °  °Document Released: 11/07/2005 Document Revised: 11/28/2014 Document Reviewed: 03/11/2014 °Elsevier Interactive Patient Education ©2016 [employer] ° ° °No driving for 24 hours. No lifting over 5 lbs for 1 week. No sexual activity for 1 week. Keep procedure site clean & dry. If you notice increased pain, swelling, bleeding or pus, call/return!  You may shower, but no soaking baths/hot tubs/pools for 1 week.  ° ° °

## 2016-01-15 NOTE — Care Management (Signed)
1324 01-15-16 Jacqlyn Krauss, RN,BSN [phone] CM did call [facility] on [address] in [place] and medication Kary Kos is available. CM will make pt aware of this and co pay amount. No further needs from CM at this time. Bethena Roys, RN,BSN [phone]

## 2016-01-15 NOTE — Plan of Care (Signed)
Problem: Pain Managment: Goal: General experience of comfort will improve Outcome: Completed/Met Date Met:  01/15/16 Per previous documentation patient had experienced chest pain.  Patient has denied pain thus far this shift.

## 2016-01-15 NOTE — Care Management Important Message (Signed)
Important Message  Patient Details  Name: JOVONN SIBAJA MRN: [id number] Date of Birth: 1935/03/23   Medicare Important Message Given:  Yes    Yvette Loveless Abena 01/15/2016, 11:45 AM

## 2016-01-15 NOTE — Plan of Care (Signed)
Problem: Bowel/Gastric: Goal: Will not experience complications related to bowel motility Outcome: Progressing Per patient had some constipation a couple days ago but reports normal bowel movement yesterday.

## 2016-01-18 NOTE — Telephone Encounter (Signed)
Patient contacted regarding discharge from [facility]   Patient understands to follow up with provider ? 3/3 2 10  am to arrive 15 minutes before; appt with Kerin Ransom  Patient understands discharge instructions? Yes   Patient understands medications and regiment? Yes  Patient understands to bring all medications to this visit? Yes

## 2016-01-22 ENCOUNTER — Ambulatory Visit (INDEPENDENT_AMBULATORY_CARE_PROVIDER_SITE_OTHER): Payer: Medicare Other | Admitting: Cardiology

## 2016-01-22 ENCOUNTER — Encounter: Payer: Self-pay | Admitting: Cardiology

## 2016-01-22 VITALS — BP 130/70 | HR 80 | Ht 69.0 in | Wt 244.4 lb

## 2016-01-22 DIAGNOSIS — I2510 Atherosclerotic heart disease of native coronary artery without angina pectoris: Secondary | ICD-10-CM | POA: Diagnosis not present

## 2016-01-22 DIAGNOSIS — I2119 ST elevation (STEMI) myocardial infarction involving other coronary artery of inferior wall: Secondary | ICD-10-CM | POA: Diagnosis not present

## 2016-01-22 DIAGNOSIS — I10 Essential (primary) hypertension: Secondary | ICD-10-CM

## 2016-01-22 DIAGNOSIS — Z9861 Coronary angioplasty status: Secondary | ICD-10-CM

## 2016-01-22 DIAGNOSIS — N289 Disorder of kidney and ureter, unspecified: Secondary | ICD-10-CM | POA: Diagnosis not present

## 2016-01-22 DIAGNOSIS — I255 Ischemic cardiomyopathy: Secondary | ICD-10-CM

## 2016-01-22 DIAGNOSIS — N183 Chronic kidney disease, stage 3 unspecified: Secondary | ICD-10-CM

## 2016-01-22 DIAGNOSIS — E0821 Diabetes mellitus due to underlying condition with diabetic nephropathy: Secondary | ICD-10-CM

## 2016-01-22 DIAGNOSIS — I214 Non-ST elevation (NSTEMI) myocardial infarction: Secondary | ICD-10-CM

## 2016-01-22 DIAGNOSIS — E785 Hyperlipidemia, unspecified: Secondary | ICD-10-CM

## 2016-01-22 LAB — BASIC METABOLIC PANEL
BUN: 24 mg/dL (ref 7–25)
CO2: 24 mmol/L (ref 20–31)
Calcium: 9 mg/dL (ref 8.6–10.3)
Chloride: 103 mmol/L (ref 98–110)
Creat: 1.84 mg/dL — ABNORMAL HIGH (ref 0.70–1.18)
Glucose, Bld: 99 mg/dL (ref 65–99)
Potassium: 4.6 mmol/L (ref 3.5–5.3)
Sodium: 137 mmol/L (ref 135–146)

## 2016-01-22 NOTE — Assessment & Plan Note (Signed)
Echo 01/13/2016 EF 40-45%

## 2016-01-22 NOTE — Assessment & Plan Note (Signed)
Type 2 NIDDM with CRI-3

## 2016-01-22 NOTE — Patient Instructions (Addendum)
Medication Instructions:  Your physician has recommended you make the following change in your medication:    Labwork: TODAY:  BMET  Testing/Procedures: None ordered  Follow-Up: Your physician recommends that you schedule a follow-up appointment in:  [facility] DR[facility] BRANCH IN THE [place] OFFICE   Any Other Special Instructions Will Be Listed Below (If Applicable).     If you need a refill on your cardiac medications before your next appointment, please call your pharmacy.

## 2016-01-22 NOTE — Assessment & Plan Note (Signed)
Controlled.  

## 2016-01-22 NOTE — Assessment & Plan Note (Signed)
Inferior STEMI, transferred from [place] [facility]

## 2016-01-22 NOTE — Progress Notes (Signed)
01/22/2016 Darrell Marshall   Sep 05, 1935  [id number]  Primary Physician HAWKINS,Darrell L, MD Primary Cardiologist: Darrell Marshall  HPI:  Pleasant 81 y/o obese AA male from [place]- followed by Dr Darrell Marshall with DM, HTN, and CRI. He was admitted to [facility] 01/12/16 with an inferior STEMI. He was transferred to [facility] and had urgent cath/ RCA DES. The procedure was complicated by reperfusion VT- shocked x 1.  Post MI he did well and he is her today for his follow up OV.    Current Outpatient Prescriptions  Medication Sig Dispense Refill  . AIMSCO INSULIN SYR ULTRA THIN 31G X 5/16" 0.3 ML MISC     . amLODipine (NORVASC) 10 MG tablet Take 1 tablet (10 mg total) by mouth daily. 90 tablet 3  . aspirin 81 MG chewable tablet Chew 1 tablet (81 mg total) by mouth daily.    [name] Kitchen atorvastatin (LIPITOR) 80 MG tablet Take 1 tablet (80 mg total) by mouth daily at 6 PM. 90 tablet 3  . famotidine (PEPCID) 40 MG tablet Take 40 mg by mouth daily.    [name] Kitchen losartan (COZAAR) 50 MG tablet Take 1 tablet (50 mg total) by mouth daily. 90 tablet 3  . metFORMIN (GLUCOPHAGE) 500 MG tablet Take 500 mg by mouth 2 (two) times daily.    . metoprolol tartrate (LOPRESSOR) 25 MG tablet Take 1 tablet (25 mg total) by mouth 2 (two) times daily. 180 tablet 3  . nitroGLYCERIN (NITROSTAT) 0.4 MG SL tablet Place 1 tablet (0.4 mg total) under the tongue every 5 (five) minutes x 3 doses as needed for chest pain. 25 tablet 3  . ONE TOUCH ULTRA TEST test strip     . ticagrelor (BRILINTA) 90 MG TABS tablet Take 1 tablet (90 mg total) by mouth 2 (two) times daily. 180 tablet 3  . traMADol (ULTRAM) 50 MG tablet Take 50 mg by mouth every 6 (six) hours as needed for moderate pain.    . vitamin C (ASCORBIC ACID) 500 MG tablet Take 1,000 mg by mouth daily.    [name] Kitchen zinc gluconate 50 MG tablet Take 50 mg by mouth daily.     No current facility-administered medications for this visit.    No Known Allergies  Social History   Social  History  . Marital Status: Widowed    Spouse Name: N/A  . Number of Children: N/A  . Years of Education: N/A   Occupational History  . Not on file.   Social History Main Topics  . Smoking status: Never Smoker   . Smokeless tobacco: Not on file  . Alcohol Use: No  . Drug Use: No  . Sexual Activity: Not on file   Other Topics Concern  . Not on file   Social History Narrative     Review of Systems: General: negative for chills, fever, night sweats or weight changes.  Cardiovascular: negative for chest pain, dyspnea on exertion, edema, orthopnea, palpitations, paroxysmal nocturnal dyspnea or shortness of breath Dermatological: negative for rash Respiratory: negative for cough or wheezing Urologic: negative for hematuria Abdominal: negative for nausea, vomiting, diarrhea, bright red blood per rectum, melena, or hematemesis Neurologic: negative for visual changes, syncope, or dizziness All other systems reviewed and are otherwise negative except as noted above.    Blood pressure 130/70, pulse 80, height 5\' 9"  (1.753 m), weight 244 lb 6.4 oz (110.859 kg).  General appearance: alert, cooperative, no distress and moderately obese Lungs: clear to auscultation bilaterally Heart:  regular rate and rhythm Neurologic: Grossly normal  EKG NSR, inferior TWI, poor anterior RW  ASSESSMENT AND PLAN:   STEMI) of inferior wall 01/12/16 Inferior STEMI, transferred from [facility] Doing well at first post hospital f/u  CAD S/P percutaneous coronary angioplasty cath 01/12/2016 50% ost D1, 60% prox LCx, 100% distal RCA lesion treated with DES Procedure complicated by reperfusion VT- shocked x 1  Ischemic cardiomyopathy Echo 01/13/2016 EF 40-45%  Hypertension Controlled  CKD (chronic kidney disease), stage III Check BMP today  Diabetes mellitus with renal complications (HCC) Type 2 NIDDM with CRI-3  Dyslipidemia On high dose statin Rx   PLAN  Check BMP today. F/U in [place] in 3  months- check lipids and LFTs then.   Darrell Marshall K PA-C 01/22/2016 11:12 AM

## 2016-01-22 NOTE — Assessment & Plan Note (Signed)
cath 01/12/2016 50% ost D1, 60% prox LCx, 100% distal RCA lesion treated with DES Procedure complicated by reperfusion VT- shocked x 1

## 2016-01-22 NOTE — Assessment & Plan Note (Signed)
On high dose statin Rx 

## 2016-01-22 NOTE — Assessment & Plan Note (Signed)
Check BMP today 

## 2016-01-26 ENCOUNTER — Encounter (HOSPITAL_COMMUNITY)
Admission: RE | Admit: 2016-01-26 | Discharge: 2016-01-26 | Disposition: A | Discharge status: Home / Self Care | Payer: Medicare Other | Source: Ambulatory Visit | Attending: Cardiovascular Disease | Admitting: Cardiovascular Disease

## 2016-01-26 ENCOUNTER — Encounter (HOSPITAL_COMMUNITY): Payer: Self-pay

## 2016-01-26 VITALS — BP 130/70 | HR 78 | Ht 68.5 in | Wt 244.3 lb

## 2016-01-26 DIAGNOSIS — I2119 ST elevation (STEMI) myocardial infarction involving other coronary artery of inferior wall: Secondary | ICD-10-CM

## 2016-01-26 DIAGNOSIS — Z955 Presence of coronary angioplasty implant and graft: Secondary | ICD-10-CM

## 2016-01-26 NOTE — Progress Notes (Signed)
Cardiac Individual Treatment Plan  Patient Details  Name: Darrell Marshall MRN: [id number] Date of Birth: [birth year] Referring Provider:  Sherren Mocha, MD  Initial Encounter Date:       CARDIAC REHAB PHASE II ORIENTATION from 01/26/2016 in [place]   Date  01/26/16      Visit Diagnosis: ST elevation (STEMI) myocardial infarction involving other coronary artery of inferior wall ([place])  Stented coronary artery  Patient's Home Medications on Admission:  Current outpatient prescriptions:  .  diltiazem (CARDIZEM CD) 180 MG 24 hr capsule, Take 180 mg by mouth daily., Disp: , Rfl:  .  AIMSCO INSULIN SYR ULTRA THIN 31G X 5/16" 0.3 ML MISC, , Disp: , Rfl:  .  amLODipine (NORVASC) 10 MG tablet, Take 1 tablet (10 mg total) by mouth daily., Disp: 90 tablet, Rfl: 3 .  aspirin 81 MG chewable tablet, Chew 1 tablet (81 mg total) by mouth daily., Disp: , Rfl:  .  atorvastatin (LIPITOR) 80 MG tablet, Take 1 tablet (80 mg total) by mouth daily at 6 PM., Disp: 90 tablet, Rfl: 3 .  famotidine (PEPCID) 40 MG tablet, Take 40 mg by mouth daily., Disp: , Rfl:  .  losartan (COZAAR) 50 MG tablet, Take 1 tablet (50 mg total) by mouth daily., Disp: 90 tablet, Rfl: 3 .  metFORMIN (GLUCOPHAGE) 500 MG tablet, Take 500 mg by mouth 2 (two) times daily., Disp: , Rfl:  .  metoprolol tartrate (LOPRESSOR) 25 MG tablet, Take 1 tablet (25 mg total) by mouth 2 (two) times daily., Disp: 180 tablet, Rfl: 3 .  nitroGLYCERIN (NITROSTAT) 0.4 MG SL tablet, Place 1 tablet (0.4 mg total) under the tongue every 5 (five) minutes x 3 doses as needed for chest pain., Disp: 25 tablet, Rfl: 3 .  ONE TOUCH ULTRA TEST test strip, , Disp: , Rfl:  .  ticagrelor (BRILINTA) 90 MG TABS tablet, Take 1 tablet (90 mg total) by mouth 2 (two) times daily., Disp: 180 tablet, Rfl: 3 .  vitamin C (ASCORBIC ACID) 500 MG tablet, Take 1,000 mg by mouth daily., Disp: , Rfl:  .  zinc gluconate 50 MG tablet, Take 50 mg by mouth daily.,  Disp: , Rfl:   Past Medical History: Past Medical History  Diagnosis Date  . Diabetes mellitus without complication ([place])   . Hypertension   . ST elevation myocardial infarction (STEMI) of inferior wall ([place]) 01/12/2016  . Ischemic cardiomyopathy     Echo 01/13/2016 EF 40-45%  . CAD (coronary artery disease), native coronary artery     cath 01/12/2016 50% ost D1, 60% prox LCx, 100% distal RCA lesion treated with DES  . CKD (chronic kidney disease), stage III 01/15/2016    Tobacco Use: History  Smoking status  . Never Smoker   Smokeless tobacco  . Not on file    Labs:     Recent Review Flowsheet Data    Labs for ITP Cardiac and Pulmonary Rehab Latest Ref Rng 01/13/2016   Cholestrol 0 - 200 mg/dL 157   LDLCALC 0 - 99 mg/dL 82   HDL >40 mg/dL 33(L)   Trlycerides <150 mg/dL 208(H)   Hemoglobin A1c 4.8 - 5.6 % 6.4(H)      Capillary Blood Glucose: Lab Results  Component Value Date   GLUCAP 113* 01/15/2016   GLUCAP 124* 01/15/2016   GLUCAP 91 01/14/2016   GLUCAP 103* 01/14/2016   GLUCAP 128* 01/14/2016     Exercise Target Goals: Date: 01/26/16  Exercise Program  Goal: Individual exercise prescription set with THRR, safety & activity barriers. Participant demonstrates ability to understand and report RPE using BORG scale, to self-measure pulse accurately, and to acknowledge the importance of the exercise prescription.  Exercise Prescription Goal: Starting with aerobic activity 30 plus minutes a day, 3 days per week for initial exercise prescription. Provide home exercise prescription and guidelines that participant acknowledges understanding prior to discharge.  Activity Barriers & Risk Stratification:     Activity Barriers & Cardiac Risk Stratification - 01/26/16 1617    Activity Barriers & Cardiac Risk Stratification   Activity Barriers None   Cardiac Risk Stratification High      6 Minute Walk:     6 Minute Walk      01/26/16 1500       6 Minute Walk    Phase Initial     Distance 950 feet     Walk Time 6 minutes     # of Rest Breaks 0     MPH 1.8     METS 2.38     RPE 13     Perceived Dyspnea  13     VO2 Peak 5.47     Symptoms No     Resting HR 78 bpm     Resting BP 130/70 mmHg     Max Ex. HR 100 bpm     Max Ex. BP 170/88 mmHg     Pre/Post BP   Baseline BP 130/70 mmHg     6 Minute BP 170/88 mmHg     2 Minute Post BP 148/78 mmHg     Pre/Post BP? Yes        Initial Exercise Prescription:     Initial Exercise Prescription - 01/26/16 1600    Date of Initial Exercise Prescription   Date 01/26/16   NuStep   Level 2   Minutes 15   METs 1.5   Arm Ergometer   Level 1.5   Minutes 15   METs 1.5   Prescription Details   Frequency (times per week) 3   Intensity   THRR REST +  30   THRR 40-80% of Max Heartrate 103-128   Ratings of Perceived Exertion 11-13   Progression   Progression Continue to progress workloads to maintain intensity without signs/symptoms of physical distress.   Resistance Training   Training Prescription Yes   Weight 1.0   Reps 10-12      Perform Capillary Blood Glucose checks as needed.  Exercise Prescription Changes:   Discharge Exercise Prescription (Final Exercise Prescription Changes):   Nutrition:  Target Goals: Understanding of nutrition guidelines, daily intake of sodium 1500mg , cholesterol 200mg , calories 30% from fat and 7% or less from saturated fats, daily to have 5 or more servings of fruits and vegetables.  Biometrics:     Pre Biometrics - 01/26/16 1615    Pre Biometrics   Height 5' 8.5" (1.74 m)   Weight 244 lb 4.8 oz (110.814 kg)   Waist Circumference 49 inches   Hip Circumference 45 inches   Waist to Hip Ratio 1.09 %   BMI (Calculated) 36.7   Triceps Skinfold 18 mm   % Body Fat 36.2 %   Grip Strength 53 kg   Flexibility 0 in   Single Leg Stand 22 seconds       Nutrition Therapy Plan and Nutrition Goals:     Nutrition Therapy & Goals - 01/26/16 1620     Intervention Plan   Intervention Nutrition handout(s) given  to patient.      Nutrition Discharge: Rate Your Plate Scores:     Nutrition Assessments - 01/26/16 1638    MEDFICTS Scores   Pre Score 31      Nutrition Goals Re-Evaluation:   Psychosocial: Target Goals: Acknowledge presence or absence of depression, maximize coping skills, provide positive support system. Participant is able to verbalize types and ability to use techniques and skills needed for reducing stress and depression.  Initial Review & Psychosocial Screening:     Initial Psych Review & Screening - 01/26/16 [place]? Yes   Barriers   Psychosocial barriers to participate in program There are no identifiable barriers or psychosocial needs.   Screening Interventions   Interventions Encouraged to exercise      Quality of Life Scores:     Quality of Life - 01/26/16 1616    Quality of Life Scores   Health/Function Pre 25.6 %   Socioeconomic Pre 30 %   Psych/Spiritual Pre 30 %   Family Pre 28 %   GLOBAL Pre 27.82 %      PHQ-9:     Recent Review Flowsheet Data    Depression screen [facility] 2/9 01/26/2016   Decreased Interest 0   Down, Depressed, Hopeless 1   PHQ - 2 Score 1   Altered sleeping 0   Tired, decreased energy 0   Change in appetite 0   Feeling bad or failure about yourself  0   Trouble concentrating 0   Moving slowly or fidgety/restless 0   Suicidal thoughts 0   PHQ-9 Score 1   Difficult doing work/chores Not difficult at all      Psychosocial Evaluation and Intervention:     Psychosocial Evaluation - 01/26/16 1624    Psychosocial Evaluation & Interventions   Interventions --  Not depressed   Comments Patient scored low on PHQ-2 and PHQ-9      Psychosocial Re-Evaluation:   Vocational Rehabilitation: Provide vocational rehab assistance to qualifying candidates.   Vocational Rehab Evaluation & Intervention:     Vocational Rehab -  01/26/16 1619    Initial Vocational Rehab Evaluation & Intervention   Assessment shows need for Vocational Rehabilitation No      Education: Education Goals: Education classes will be provided on a weekly basis, covering required topics. Participant will state understanding/return demonstration of topics presented.  Learning Barriers/Preferences:     Learning Barriers/Preferences - 01/26/16 1618    Learning Barriers/Preferences   Learning Barriers None   Learning Preferences Audio;Verbal Instruction      Education Topics: Hypertension, Hypertension Reduction -Define heart disease and high blood pressure. Discus how high blood pressure affects the body and ways to reduce high blood pressure.   Exercise and Your Heart -Discuss why it is important to exercise, the FITT principles of exercise, normal and abnormal responses to exercise, and how to exercise safely.   Angina -Discuss definition of angina, causes of angina, treatment of angina, and how to decrease risk of having angina.   Cardiac Medications -Review what the following cardiac medications are used for, how they affect the body, and side effects that may occur when taking the medications.  Medications include Aspirin, Beta blockers, calcium channel blockers, ACE Inhibitors, angiotensin receptor blockers, diuretics, digoxin, and antihyperlipidemics.   Congestive Heart Failure -Discuss the definition of CHF, how to live with CHF, the signs and symptoms of CHF, and how keep track of weight and sodium intake.  Heart Disease and Intimacy -Discus the effect sexual activity has on the heart, how changes occur during intimacy as we age, and safety during sexual activity.   Smoking Cessation / COPD -Discuss different methods to quit smoking, the health benefits of quitting smoking, and the definition of COPD.   Nutrition I: Fats -Discuss the types of cholesterol, what cholesterol does to the heart, and how cholesterol  levels can be controlled.   Nutrition II: Labels -Discuss the different components of food labels and how to read food label   Heart Parts and Heart Disease -Discuss the anatomy of the heart, the pathway of blood circulation through the heart, and these are affected by heart disease.   Stress I: Signs and Symptoms -Discuss the causes of stress, how stress may lead to anxiety and depression, and ways to limit stress.   Stress II: Relaxation -Discuss different types of relaxation techniques to limit stress.   Warning Signs of Stroke / TIA -Discuss definition of a stroke, what the signs and symptoms are of a stroke, and how to identify when someone is having stroke.   Knowledge Questionnaire Score:     Knowledge Questionnaire Score - 01/26/16 1619    Knowledge Questionnaire Score   Pre Score 19/24      Personal Goals and Risk Factors at Admission:     Personal Goals and Risk Factors at Admission - 01/26/16 1621    Core Components/Risk Factors/Patient Goals on Admission    Weight Management Obesity   Sedentary --  Walks at home   Tobacco Cessation --  Quit 01/11/1984   Diabetes Yes   Intervention Provide education about signs/symptoms and action to take for hypo/hyperglycemia.;Provide education about proper nutrition, including hydration, and aerobic/resistive exercise prescription along with prescribed medications to achieve blood glucose in normal ranges: Fasting glucose 65-99 mg/dL   Expected Outcomes Long Term: Attainment of HbA1C < 7%.;Short Term: Participant verbalizes understanding of the signs/symptoms and immediate care of hyper/hypoglycemia, proper foot care and importance of medication, aerobic/resistive exercise and nutrition plan for blood glucose control.   Lipids Yes   Intervention Provide education and support for participant on nutrition & aerobic/resistive exercise along with prescribed medications to achieve LDL 70mg , HDL >40mg .   Expected Outcomes Long  Term: Cholesterol controlled with medications as prescribed, with individualized exercise RX and with personalized nutrition plan. Value goals: LDL < 70mg , HDL > 40 mg.;Short Term: Participant states understanding of desired cholesterol values and is compliant with medications prescribed. Participant is following exercise prescription and nutrition guidelines.   Stress --  no   Personal Goal Other --  Breath better, Increas activity and lose weight      Personal Goals and Risk Factors Review:      Goals and Risk Factor Review      01/26/16 1623           Core Components/Risk Factors/Patient Goals Review   Personal Goals Review Weight Management/Obesity          Personal Goals Discharge (Final Personal Goals and Risk Factors Review):      Goals and Risk Factor Review - 01/26/16 1623    Core Components/Risk Factors/Patient Goals Review   Personal Goals Review Weight Management/Obesity      ITP Comments:     ITP Comments      01/26/16 1638           ITP Comments Patient is a 81 year old male that is widowed. He enjoys golfing and pitching horse shoes.  Patient did not dispaly any signs of depression. Patient very independent and self sufficient.           Comments: Patient arrived for 1st visit/orientation/education at 2:30 pm. Patient was referred to CR by Dr. Burt Knack due to STEMI (I21.19)/Stent (Z95.5). During orientation advised patient on arrival and appointment times what to wear, what to do before, during and after exercise. Reviewed attendance and class policy. Talked about inclement weather and class consultation policy. Pt is scheduled to return Cardiac Rehab on 01/29/16 at 11:00 am. Pt was advised to come to class 5 minutes before class starts. He was also given instructions on meeting with the dietician and attending the Family Structure classes. Pt is eager to get started. Patient was able to complete 6 minute walk test. Patient was measured for the equipment.  Discussed equipment safety with patient. Took patient pre-anthropometric measurements. Patient finished visit at 4:30 pm.

## 2016-01-26 NOTE — Progress Notes (Signed)
Cardiac/Pulmonary Rehab Medication Review by a Pharmacist  Does the patient  feel that his/her medications are working for him/her?  yes  Has the patient been experiencing any side effects to the medications prescribed?  no  Does the patient measure his/her own blood pressure or blood glucose at home?  no   Does the patient have any problems obtaining medications due to transportation or finances?   no  Understanding of regimen: good Understanding of indications: good Potential of compliance: good  Questions asked to Determine Patient Understanding of Medication Regimen:  1. What is the name of the medication?  2. What is the medication used for?  3. When should it be taken?  4. How much should be taken?  5. How will you take it?  6. What side effects should you report?  Understanding Defined as: Excellent: All questions above are correct Good: Questions 1-4 are correct Fair: Questions 1-2 are correct  Poor: 1 or none of the above questions are correct   Pharmacist comments: Pt does not c/o any side effects from medication.  Pt checks blood sugar at home but not BP.  Cardizem CD was not on med list so it was added.  Hart Robinsons A 01/26/2016 3:10 PM

## 2016-01-29 ENCOUNTER — Encounter (HOSPITAL_COMMUNITY)
Admission: RE | Admit: 2016-01-29 | Discharge: 2016-01-29 | Disposition: A | Discharge status: Home / Self Care | Payer: Medicare Other | Source: Ambulatory Visit | Attending: Cardiovascular Disease | Admitting: Cardiovascular Disease

## 2016-01-29 DIAGNOSIS — Z955 Presence of coronary angioplasty implant and graft: Secondary | ICD-10-CM | POA: Diagnosis not present

## 2016-01-29 DIAGNOSIS — I2119 ST elevation (STEMI) myocardial infarction involving other coronary artery of inferior wall: Secondary | ICD-10-CM | POA: Diagnosis not present

## 2016-02-01 ENCOUNTER — Encounter (HOSPITAL_COMMUNITY)
Admission: RE | Admit: 2016-02-01 | Discharge: 2016-02-01 | Disposition: A | Discharge status: Home / Self Care | Payer: Medicare Other | Source: Ambulatory Visit | Attending: Cardiovascular Disease | Admitting: Cardiovascular Disease

## 2016-02-01 DIAGNOSIS — Z955 Presence of coronary angioplasty implant and graft: Secondary | ICD-10-CM | POA: Diagnosis not present

## 2016-02-01 DIAGNOSIS — I2119 ST elevation (STEMI) myocardial infarction involving other coronary artery of inferior wall: Secondary | ICD-10-CM | POA: Diagnosis not present

## 2016-02-03 ENCOUNTER — Encounter (HOSPITAL_COMMUNITY)
Admission: RE | Admit: 2016-02-03 | Discharge: 2016-02-03 | Disposition: A | Discharge status: Home / Self Care | Payer: Medicare Other | Source: Ambulatory Visit | Attending: Cardiovascular Disease | Admitting: Cardiovascular Disease

## 2016-02-03 DIAGNOSIS — I2119 ST elevation (STEMI) myocardial infarction involving other coronary artery of inferior wall: Secondary | ICD-10-CM | POA: Diagnosis not present

## 2016-02-03 DIAGNOSIS — Z955 Presence of coronary angioplasty implant and graft: Secondary | ICD-10-CM | POA: Diagnosis not present

## 2016-02-05 ENCOUNTER — Encounter (HOSPITAL_COMMUNITY)
Admission: RE | Admit: 2016-02-05 | Discharge: 2016-02-05 | Disposition: A | Discharge status: Home / Self Care | Payer: Medicare Other | Source: Ambulatory Visit | Attending: Cardiovascular Disease | Admitting: Cardiovascular Disease

## 2016-02-05 DIAGNOSIS — Z955 Presence of coronary angioplasty implant and graft: Secondary | ICD-10-CM | POA: Diagnosis not present

## 2016-02-05 DIAGNOSIS — I2119 ST elevation (STEMI) myocardial infarction involving other coronary artery of inferior wall: Secondary | ICD-10-CM | POA: Diagnosis not present

## 2016-02-08 ENCOUNTER — Encounter (HOSPITAL_COMMUNITY)
Admission: RE | Admit: 2016-02-08 | Discharge: 2016-02-08 | Disposition: A | Discharge status: Home / Self Care | Payer: Medicare Other | Source: Ambulatory Visit | Attending: Cardiovascular Disease | Admitting: Cardiovascular Disease

## 2016-02-08 DIAGNOSIS — Z955 Presence of coronary angioplasty implant and graft: Secondary | ICD-10-CM | POA: Diagnosis not present

## 2016-02-08 DIAGNOSIS — I2119 ST elevation (STEMI) myocardial infarction involving other coronary artery of inferior wall: Secondary | ICD-10-CM | POA: Diagnosis not present

## 2016-02-10 ENCOUNTER — Encounter (HOSPITAL_COMMUNITY)
Admission: RE | Admit: 2016-02-10 | Discharge: 2016-02-10 | Disposition: A | Discharge status: Home / Self Care | Payer: Medicare Other | Source: Ambulatory Visit | Attending: Cardiovascular Disease | Admitting: Cardiovascular Disease

## 2016-02-10 DIAGNOSIS — Z955 Presence of coronary angioplasty implant and graft: Secondary | ICD-10-CM | POA: Diagnosis not present

## 2016-02-10 DIAGNOSIS — I2119 ST elevation (STEMI) myocardial infarction involving other coronary artery of inferior wall: Secondary | ICD-10-CM | POA: Diagnosis not present

## 2016-02-12 ENCOUNTER — Encounter (HOSPITAL_COMMUNITY)
Admission: RE | Admit: 2016-02-12 | Discharge: 2016-02-12 | Disposition: A | Discharge status: Home / Self Care | Payer: Medicare Other | Source: Ambulatory Visit | Attending: Cardiovascular Disease | Admitting: Cardiovascular Disease

## 2016-02-12 DIAGNOSIS — I2119 ST elevation (STEMI) myocardial infarction involving other coronary artery of inferior wall: Secondary | ICD-10-CM | POA: Diagnosis not present

## 2016-02-12 DIAGNOSIS — Z955 Presence of coronary angioplasty implant and graft: Secondary | ICD-10-CM | POA: Diagnosis not present

## 2016-02-15 ENCOUNTER — Encounter (HOSPITAL_COMMUNITY)
Admission: RE | Admit: 2016-02-15 | Discharge: 2016-02-15 | Disposition: A | Discharge status: Home / Self Care | Payer: Medicare Other | Source: Ambulatory Visit | Attending: Cardiovascular Disease | Admitting: Cardiovascular Disease

## 2016-02-15 DIAGNOSIS — I5022 Chronic systolic (congestive) heart failure: Secondary | ICD-10-CM | POA: Diagnosis not present

## 2016-02-15 DIAGNOSIS — E1121 Type 2 diabetes mellitus with diabetic nephropathy: Secondary | ICD-10-CM | POA: Diagnosis not present

## 2016-02-15 DIAGNOSIS — I25119 Atherosclerotic heart disease of native coronary artery with unspecified angina pectoris: Secondary | ICD-10-CM | POA: Diagnosis not present

## 2016-02-15 DIAGNOSIS — I129 Hypertensive chronic kidney disease with stage 1 through stage 4 chronic kidney disease, or unspecified chronic kidney disease: Secondary | ICD-10-CM | POA: Diagnosis not present

## 2016-02-15 DIAGNOSIS — I2119 ST elevation (STEMI) myocardial infarction involving other coronary artery of inferior wall: Secondary | ICD-10-CM | POA: Diagnosis not present

## 2016-02-15 DIAGNOSIS — Z955 Presence of coronary angioplasty implant and graft: Secondary | ICD-10-CM | POA: Diagnosis not present

## 2016-02-15 NOTE — Progress Notes (Signed)
Cardiac Individual Treatment Plan  Patient Details  Name: Darrell Marshall MRN: [id number] Date of Birth: 09/26/1935 Referring Provider:  Sherren Mocha, MD  Initial Encounter Date:       CARDIAC REHAB PHASE II ORIENTATION from 01/26/2016 in [place]   Date  01/26/16      Visit Diagnosis: No diagnosis found.  Patient's Home Medications on Admission:  Current outpatient prescriptions:  .  AIMSCO INSULIN SYR ULTRA THIN 31G X 5/16" 0.3 ML MISC, , Disp: , Rfl:  .  amLODipine (NORVASC) 10 MG tablet, Take 1 tablet (10 mg total) by mouth daily., Disp: 90 tablet, Rfl: 3 .  aspirin 81 MG chewable tablet, Chew 1 tablet (81 mg total) by mouth daily., Disp: , Rfl:  .  atorvastatin (LIPITOR) 80 MG tablet, Take 1 tablet (80 mg total) by mouth daily at 6 PM., Disp: 90 tablet, Rfl: 3 .  diltiazem (CARDIZEM CD) 180 MG 24 hr capsule, Take 180 mg by mouth daily., Disp: , Rfl:  .  famotidine (PEPCID) 40 MG tablet, Take 40 mg by mouth daily., Disp: , Rfl:  .  losartan (COZAAR) 50 MG tablet, Take 1 tablet (50 mg total) by mouth daily., Disp: 90 tablet, Rfl: 3 .  metFORMIN (GLUCOPHAGE) 500 MG tablet, Take 500 mg by mouth 2 (two) times daily., Disp: , Rfl:  .  metoprolol tartrate (LOPRESSOR) 25 MG tablet, Take 1 tablet (25 mg total) by mouth 2 (two) times daily., Disp: 180 tablet, Rfl: 3 .  nitroGLYCERIN (NITROSTAT) 0.4 MG SL tablet, Place 1 tablet (0.4 mg total) under the tongue every 5 (five) minutes x 3 doses as needed for chest pain., Disp: 25 tablet, Rfl: 3 .  ONE TOUCH ULTRA TEST test strip, , Disp: , Rfl:  .  ticagrelor (BRILINTA) 90 MG TABS tablet, Take 1 tablet (90 mg total) by mouth 2 (two) times daily., Disp: 180 tablet, Rfl: 3 .  vitamin C (ASCORBIC ACID) 500 MG tablet, Take 1,000 mg by mouth daily., Disp: , Rfl:  .  zinc gluconate 50 MG tablet, Take 50 mg by mouth daily., Disp: , Rfl:   Past Medical History: Past Medical History  Diagnosis Date  . Diabetes mellitus  without complication ([place])   . Hypertension   . ST elevation myocardial infarction (STEMI) of inferior wall ([place]) 01/12/2016  . Ischemic cardiomyopathy     Echo 01/13/2016 EF 40-45%  . CAD (coronary artery disease), native coronary artery     cath 01/12/2016 50% ost D1, 60% prox LCx, 100% distal RCA lesion treated with DES  . CKD (chronic kidney disease), stage III 01/15/2016    Tobacco Use: History  Smoking status  . Never Smoker   Smokeless tobacco  . Not on file    Labs:     Recent Review Flowsheet Data    Labs for ITP Cardiac and Pulmonary Rehab Latest Ref Rng 01/13/2016   Cholestrol 0 - 200 mg/dL 157   LDLCALC 0 - 99 mg/dL 82   HDL >40 mg/dL 33(L)   Trlycerides <150 mg/dL 208(H)   Hemoglobin A1c 4.8 - 5.6 % 6.4(H)      Capillary Blood Glucose: Lab Results  Component Value Date   GLUCAP 113* 01/15/2016   GLUCAP 124* 01/15/2016   GLUCAP 91 01/14/2016   GLUCAP 103* 01/14/2016   GLUCAP 128* 01/14/2016     Exercise Target Goals:    Exercise Program Goal: Individual exercise prescription set with THRR, safety & activity barriers. Participant demonstrates ability  to understand and report RPE using BORG scale, to self-measure pulse accurately, and to acknowledge the importance of the exercise prescription.  Exercise Prescription Goal: Starting with aerobic activity 30 plus minutes a day, 3 days per week for initial exercise prescription. Provide home exercise prescription and guidelines that participant acknowledges understanding prior to discharge.  Activity Barriers & Risk Stratification:     Activity Barriers & Cardiac Risk Stratification - 01/26/16 1617    Activity Barriers & Cardiac Risk Stratification   Activity Barriers None   Cardiac Risk Stratification High      6 Minute Walk:     6 Minute Walk      01/26/16 1500       6 Minute Walk   Phase Initial     Distance 950 feet     Walk Time 6 minutes     # of Rest Breaks 0     MPH 1.8     METS 2.38      RPE 13     Perceived Dyspnea  13     VO2 Peak 5.47     Symptoms No     Resting HR 78 bpm     Resting BP 130/70 mmHg     Max Ex. HR 100 bpm     Max Ex. BP 170/88 mmHg     2 Minute Post BP 148/78 mmHg     Pre/Post BP   Baseline BP 130/70 mmHg     6 Minute BP 170/88 mmHg     Pre/Post BP? Yes        Initial Exercise Prescription:     Initial Exercise Prescription - 01/26/16 1600    Date of Initial Exercise Prescription   Date 01/26/16   NuStep   Level 2   Minutes 15   METs 1.5   Arm Ergometer   Level 1.5   Minutes 15   METs 1.5   Prescription Details   Frequency (times per week) 3   Intensity   THRR REST +  30   THRR 40-80% of Max Heartrate 103-128   Ratings of Perceived Exertion 11-13   Progression   Progression Continue to progress workloads to maintain intensity without signs/symptoms of physical distress.   Resistance Training   Training Prescription Yes   Weight 1.0   Reps 10-12      Perform Capillary Blood Glucose checks as needed.  Exercise Prescription Changes:      Exercise Prescription Changes      02/15/16 1300           Exercise Review   Progression Yes       Response to Exercise   Blood Pressure (Admit) 144/70 mmHg       Blood Pressure (Exercise) 172/84 mmHg       Blood Pressure (Exit) 140/60 mmHg       Heart Rate (Admit) 74 bpm       Heart Rate (Exercise) 109 bpm       Heart Rate (Exit) 87 bpm       Rating of Perceived Exertion (Exercise) 13       Symptoms No       Progression   Progression Continue to progress workloads to maintain intensity without signs/symptoms of physical distress.       Resistance Training   Training Prescription Yes       Weight 2.0       Reps 10-12       Interval Training   Interval Training  No       NuStep   Level 2       Minutes 15       METs 3.71       Arm Ergometer   Level 1.5       Minutes 15       METs 2.2          Exercise Comments:    Discharge Exercise Prescription (Final  Exercise Prescription Changes):     Exercise Prescription Changes - 02/15/16 1300    Exercise Review   Progression Yes   Response to Exercise   Blood Pressure (Admit) 144/70 mmHg   Blood Pressure (Exercise) 172/84 mmHg   Blood Pressure (Exit) 140/60 mmHg   Heart Rate (Admit) 74 bpm   Heart Rate (Exercise) 109 bpm   Heart Rate (Exit) 87 bpm   Rating of Perceived Exertion (Exercise) 13   Symptoms No   Progression   Progression Continue to progress workloads to maintain intensity without signs/symptoms of physical distress.   Resistance Training   Training Prescription Yes   Weight 2.0   Reps 10-12   Interval Training   Interval Training No   NuStep   Level 2   Minutes 15   METs 3.71   Arm Ergometer   Level 1.5   Minutes 15   METs 2.2      Nutrition:  Target Goals: Understanding of nutrition guidelines, daily intake of sodium 1500mg , cholesterol 200mg , calories 30% from fat and 7% or less from saturated fats, daily to have 5 or more servings of fruits and vegetables.  Biometrics:     Pre Biometrics - 01/26/16 1615    Pre Biometrics   Height 5' 8.5" (1.74 m)   Weight 244 lb 4.8 oz (110.814 kg)   Waist Circumference 49 inches   Hip Circumference 45 inches   Waist to Hip Ratio 1.09 %   BMI (Calculated) 36.7   Triceps Skinfold 18 mm   % Body Fat 36.2 %   Grip Strength 53 kg   Flexibility 0 in   Single Leg Stand 22 seconds       Nutrition Therapy Plan and Nutrition Goals:     Nutrition Therapy & Goals - 01/26/16 1620    Intervention Plan   Intervention Nutrition handout(s) given to patient.      Nutrition Discharge: Rate Your Plate Scores:     Nutrition Assessments - 01/26/16 1638    MEDFICTS Scores   Pre Score 31      Nutrition Goals Re-Evaluation:     Nutrition Goals Re-Evaluation      02/15/16 1438           Weight   Current Weight 244 lb (110.678 kg)       Intervention Plan   Intervention Continue to educate, counsel and set  short/long term goals regarding individualized specific personal dietary modifications.          Psychosocial: Target Goals: Acknowledge presence or absence of depression, maximize coping skills, provide positive support system. Participant is able to verbalize types and ability to use techniques and skills needed for reducing stress and depression.  Initial Review & Psychosocial Screening:     Initial Psych Review & Screening - 01/26/16 [place]? Yes   Barriers   Psychosocial barriers to participate in program There are no identifiable barriers or psychosocial needs.   Screening Interventions   Interventions Encouraged to exercise  Quality of Life Scores:     Quality of Life - 01/26/16 1616    Quality of Life Scores   Health/Function Pre 25.6 %   Socioeconomic Pre 30 %   Psych/Spiritual Pre 30 %   Family Pre 28 %   GLOBAL Pre 27.82 %      PHQ-9:     Recent Review Flowsheet Data    Depression screen [facility] 2/9 01/26/2016   Decreased Interest 0   Down, Depressed, Hopeless 1   PHQ - 2 Score 1   Altered sleeping 0   Tired, decreased energy 0   Change in appetite 0   Feeling bad or failure about yourself  0   Trouble concentrating 0   Moving slowly or fidgety/restless 0   Suicidal thoughts 0   PHQ-9 Score 1   Difficult doing work/chores Not difficult at all      Psychosocial Evaluation and Intervention:     Psychosocial Evaluation - 01/26/16 1624    Psychosocial Evaluation & Interventions   Interventions --  Not depressed   Comments Patient scored low on PHQ-2 and PHQ-9      Psychosocial Re-Evaluation:     Psychosocial Re-Evaluation      02/15/16 1446           Psychosocial Re-Evaluation   Interventions Encouraged to attend Cardiac Rehabilitation for the exercise       Continued Psychosocial Services Needed No          Vocational Rehabilitation: Provide vocational rehab assistance to qualifying  candidates.   Vocational Rehab Evaluation & Intervention:     Vocational Rehab - 01/26/16 1619    Initial Vocational Rehab Evaluation & Intervention   Assessment shows need for Vocational Rehabilitation No      Education: Education Goals: Education classes will be provided on a weekly basis, covering required topics. Participant will state understanding/return demonstration of topics presented.  Learning Barriers/Preferences:     Learning Barriers/Preferences - 01/26/16 1618    Learning Barriers/Preferences   Learning Barriers None   Learning Preferences Audio;Verbal Instruction      Education Topics: Hypertension, Hypertension Reduction -Define heart disease and high blood pressure. Discus how high blood pressure affects the body and ways to reduce high blood pressure.   Exercise and Your Heart -Discuss why it is important to exercise, the FITT principles of exercise, normal and abnormal responses to exercise, and how to exercise safely.   Angina -Discuss definition of angina, causes of angina, treatment of angina, and how to decrease risk of having angina.   Cardiac Medications -Review what the following cardiac medications are used for, how they affect the body, and side effects that may occur when taking the medications.  Medications include Aspirin, Beta blockers, calcium channel blockers, ACE Inhibitors, angiotensin receptor blockers, diuretics, digoxin, and antihyperlipidemics.   Congestive Heart Failure -Discuss the definition of CHF, how to live with CHF, the signs and symptoms of CHF, and how keep track of weight and sodium intake.   Heart Disease and Intimacy -Discus the effect sexual activity has on the heart, how changes occur during intimacy as we age, and safety during sexual activity.   Smoking Cessation / COPD -Discuss different methods to quit smoking, the health benefits of quitting smoking, and the definition of COPD.      CARDIAC REHAB PHASE II  EXERCISE from 02/10/2016 in [place]   Date  02/03/16   Educator  Russella Dar   Instruction Review Code  2- meets  goals/outcomes      Nutrition I: Fats -Discuss the types of cholesterol, what cholesterol does to the heart, and how cholesterol levels can be controlled.          CARDIAC REHAB PHASE II EXERCISE from 02/10/2016 in [place]   Date  02/10/16   Educator  Russella Dar   Instruction Review Code  2- meets goals/outcomes      Nutrition II: Labels -Discuss the different components of food labels and how to read food label   Heart Parts and Heart Disease -Discuss the anatomy of the heart, the pathway of blood circulation through the heart, and these are affected by heart disease.   Stress I: Signs and Symptoms -Discuss the causes of stress, how stress may lead to anxiety and depression, and ways to limit stress.   Stress II: Relaxation -Discuss different types of relaxation techniques to limit stress.   Warning Signs of Stroke / TIA -Discuss definition of a stroke, what the signs and symptoms are of a stroke, and how to identify when someone is having stroke.   Knowledge Questionnaire Score:     Knowledge Questionnaire Score - 01/26/16 1619    Knowledge Questionnaire Score   Pre Score 19/24      Core Components/Risk Factors/Patient Goals at Admission:     Personal Goals and Risk Factors at Admission - 01/26/16 1621    Core Components/Risk Factors/Patient Goals on Admission    Weight Management Obesity   Sedentary --  Walks at home   Tobacco Cessation --  Quit 01/11/1984   Diabetes Yes   Intervention Provide education about signs/symptoms and action to take for hypo/hyperglycemia.;Provide education about proper nutrition, including hydration, and aerobic/resistive exercise prescription along with prescribed medications to achieve blood glucose in normal ranges: Fasting glucose 65-99 mg/dL   Expected Outcomes  Long Term: Attainment of HbA1C < 7%.;Short Term: Participant verbalizes understanding of the signs/symptoms and immediate care of hyper/hypoglycemia, proper foot care and importance of medication, aerobic/resistive exercise and nutrition plan for blood glucose control.   Lipids Yes   Intervention Provide education and support for participant on nutrition & aerobic/resistive exercise along with prescribed medications to achieve LDL 70mg , HDL >40mg .   Expected Outcomes Long Term: Cholesterol controlled with medications as prescribed, with individualized exercise RX and with personalized nutrition plan. Value goals: LDL < 70mg , HDL > 40 mg.;Short Term: Participant states understanding of desired cholesterol values and is compliant with medications prescribed. Participant is following exercise prescription and nutrition guidelines.   Stress --  no   Personal Goal Other --  Breath better, Increas activity and lose weight      Core Components/Risk Factors/Patient Goals Review:      Goals and Risk Factor Review      01/26/16 1623 02/15/16 1446         Core Components/Risk Factors/Patient Goals Review   Personal Goals Review Weight Management/Obesity Weight Management/Obesity;Lipids;Diabetes      Review  Will continue to education patient on these topics.  Patient is maintaining weight at 244 lbs         Core Components/Risk Factors/Patient Goals at Discharge (Final Review):      Goals and Risk Factor Review - 02/15/16 1446    Core Components/Risk Factors/Patient Goals Review   Personal Goals Review Weight Management/Obesity;Lipids;Diabetes   Review Will continue to education patient on these topics.  Patient is maintaining weight at 244 lbs      ITP Comments:     ITP  Comments      01/26/16 1638           ITP Comments Patient is a 81 year old male that is widowed. He enjoys golfing and pitching horse shoes. Patient did not dispaly any signs of depression. Patient very independent  and self sufficient.           Comments: Patients goals and exercise prescription reviewed.

## 2016-02-17 ENCOUNTER — Encounter (HOSPITAL_COMMUNITY)
Admission: RE | Admit: 2016-02-17 | Discharge: 2016-02-17 | Disposition: A | Discharge status: Home / Self Care | Payer: Medicare Other | Source: Ambulatory Visit | Attending: Cardiovascular Disease | Admitting: Cardiovascular Disease

## 2016-02-17 DIAGNOSIS — Z955 Presence of coronary angioplasty implant and graft: Secondary | ICD-10-CM | POA: Diagnosis not present

## 2016-02-17 DIAGNOSIS — I2119 ST elevation (STEMI) myocardial infarction involving other coronary artery of inferior wall: Secondary | ICD-10-CM | POA: Diagnosis not present

## 2016-02-19 ENCOUNTER — Encounter (HOSPITAL_COMMUNITY)
Admission: RE | Admit: 2016-02-19 | Discharge: 2016-02-19 | Disposition: A | Discharge status: Home / Self Care | Payer: Medicare Other | Source: Ambulatory Visit | Attending: Cardiovascular Disease | Admitting: Cardiovascular Disease

## 2016-02-19 DIAGNOSIS — I2119 ST elevation (STEMI) myocardial infarction involving other coronary artery of inferior wall: Secondary | ICD-10-CM | POA: Diagnosis not present

## 2016-02-19 DIAGNOSIS — Z955 Presence of coronary angioplasty implant and graft: Secondary | ICD-10-CM | POA: Diagnosis not present

## 2016-02-22 ENCOUNTER — Encounter (HOSPITAL_COMMUNITY)
Admission: RE | Admit: 2016-02-22 | Discharge: 2016-02-22 | Disposition: A | Discharge status: Home / Self Care | Payer: Medicare Other | Source: Ambulatory Visit | Attending: Cardiovascular Disease | Admitting: Cardiovascular Disease

## 2016-02-22 DIAGNOSIS — I2119 ST elevation (STEMI) myocardial infarction involving other coronary artery of inferior wall: Secondary | ICD-10-CM | POA: Insufficient documentation

## 2016-02-22 DIAGNOSIS — Z955 Presence of coronary angioplasty implant and graft: Secondary | ICD-10-CM | POA: Insufficient documentation

## 2016-02-23 NOTE — Progress Notes (Signed)
Patient was given individual home exercise plan. Handout was reviewed and discussed with patient. Patient signed plan and verbalized an understanding.

## 2016-02-24 ENCOUNTER — Encounter (HOSPITAL_COMMUNITY)
Admission: RE | Admit: 2016-02-24 | Discharge: 2016-02-24 | Disposition: A | Discharge status: Home / Self Care | Payer: Medicare Other | Source: Ambulatory Visit | Attending: Cardiovascular Disease | Admitting: Cardiovascular Disease

## 2016-02-24 DIAGNOSIS — Z955 Presence of coronary angioplasty implant and graft: Secondary | ICD-10-CM | POA: Diagnosis not present

## 2016-02-24 DIAGNOSIS — I2119 ST elevation (STEMI) myocardial infarction involving other coronary artery of inferior wall: Secondary | ICD-10-CM | POA: Diagnosis not present

## 2016-02-26 ENCOUNTER — Encounter (HOSPITAL_COMMUNITY)
Admission: RE | Admit: 2016-02-26 | Discharge: 2016-02-26 | Disposition: A | Discharge status: Home / Self Care | Payer: Medicare Other | Source: Ambulatory Visit | Attending: Cardiovascular Disease | Admitting: Cardiovascular Disease

## 2016-02-26 DIAGNOSIS — I2119 ST elevation (STEMI) myocardial infarction involving other coronary artery of inferior wall: Secondary | ICD-10-CM | POA: Diagnosis not present

## 2016-02-26 DIAGNOSIS — Z955 Presence of coronary angioplasty implant and graft: Secondary | ICD-10-CM | POA: Diagnosis not present

## 2016-02-29 ENCOUNTER — Encounter (HOSPITAL_COMMUNITY)
Admission: RE | Admit: 2016-02-29 | Discharge: 2016-02-29 | Disposition: A | Discharge status: Home / Self Care | Payer: Medicare Other | Source: Ambulatory Visit | Attending: Cardiovascular Disease | Admitting: Cardiovascular Disease

## 2016-02-29 DIAGNOSIS — I2119 ST elevation (STEMI) myocardial infarction involving other coronary artery of inferior wall: Secondary | ICD-10-CM | POA: Diagnosis not present

## 2016-02-29 DIAGNOSIS — Z955 Presence of coronary angioplasty implant and graft: Secondary | ICD-10-CM | POA: Diagnosis not present

## 2016-03-02 ENCOUNTER — Encounter (HOSPITAL_COMMUNITY)
Admission: RE | Admit: 2016-03-02 | Discharge: 2016-03-02 | Disposition: A | Discharge status: Home / Self Care | Payer: Medicare Other | Source: Ambulatory Visit | Attending: Cardiovascular Disease | Admitting: Cardiovascular Disease

## 2016-03-02 DIAGNOSIS — I2119 ST elevation (STEMI) myocardial infarction involving other coronary artery of inferior wall: Secondary | ICD-10-CM | POA: Diagnosis not present

## 2016-03-02 DIAGNOSIS — Z955 Presence of coronary angioplasty implant and graft: Secondary | ICD-10-CM | POA: Diagnosis not present

## 2016-03-04 ENCOUNTER — Encounter (HOSPITAL_COMMUNITY): Payer: Medicare Other

## 2016-03-07 ENCOUNTER — Encounter (HOSPITAL_COMMUNITY)
Admission: RE | Admit: 2016-03-07 | Discharge: 2016-03-07 | Disposition: A | Discharge status: Home / Self Care | Payer: Medicare Other | Source: Ambulatory Visit | Attending: Cardiovascular Disease | Admitting: Cardiovascular Disease

## 2016-03-07 DIAGNOSIS — Z955 Presence of coronary angioplasty implant and graft: Secondary | ICD-10-CM | POA: Diagnosis not present

## 2016-03-07 DIAGNOSIS — I2119 ST elevation (STEMI) myocardial infarction involving other coronary artery of inferior wall: Secondary | ICD-10-CM | POA: Diagnosis not present

## 2016-03-09 ENCOUNTER — Encounter (HOSPITAL_COMMUNITY)
Admission: RE | Admit: 2016-03-09 | Discharge: 2016-03-09 | Disposition: A | Discharge status: Home / Self Care | Payer: Medicare Other | Source: Ambulatory Visit | Attending: Cardiovascular Disease | Admitting: Cardiovascular Disease

## 2016-03-09 DIAGNOSIS — I2119 ST elevation (STEMI) myocardial infarction involving other coronary artery of inferior wall: Secondary | ICD-10-CM | POA: Diagnosis not present

## 2016-03-09 DIAGNOSIS — Z955 Presence of coronary angioplasty implant and graft: Secondary | ICD-10-CM | POA: Diagnosis not present

## 2016-03-11 ENCOUNTER — Encounter (HOSPITAL_COMMUNITY)
Admission: RE | Admit: 2016-03-11 | Discharge: 2016-03-11 | Disposition: A | Discharge status: Home / Self Care | Payer: Medicare Other | Source: Ambulatory Visit | Attending: Cardiovascular Disease | Admitting: Cardiovascular Disease

## 2016-03-11 DIAGNOSIS — Z955 Presence of coronary angioplasty implant and graft: Secondary | ICD-10-CM | POA: Diagnosis not present

## 2016-03-11 DIAGNOSIS — I2119 ST elevation (STEMI) myocardial infarction involving other coronary artery of inferior wall: Secondary | ICD-10-CM | POA: Diagnosis not present

## 2016-03-14 ENCOUNTER — Encounter (HOSPITAL_COMMUNITY)
Admission: RE | Admit: 2016-03-14 | Discharge: 2016-03-14 | Disposition: A | Discharge status: Home / Self Care | Payer: Medicare Other | Source: Ambulatory Visit | Attending: Cardiovascular Disease | Admitting: Cardiovascular Disease

## 2016-03-14 DIAGNOSIS — Z955 Presence of coronary angioplasty implant and graft: Secondary | ICD-10-CM | POA: Diagnosis not present

## 2016-03-14 DIAGNOSIS — I2119 ST elevation (STEMI) myocardial infarction involving other coronary artery of inferior wall: Secondary | ICD-10-CM | POA: Diagnosis not present

## 2016-03-16 ENCOUNTER — Encounter (HOSPITAL_COMMUNITY)
Admission: RE | Admit: 2016-03-16 | Discharge: 2016-03-16 | Disposition: A | Discharge status: Home / Self Care | Payer: Medicare Other | Source: Ambulatory Visit | Attending: Cardiovascular Disease | Admitting: Cardiovascular Disease

## 2016-03-16 DIAGNOSIS — I2119 ST elevation (STEMI) myocardial infarction involving other coronary artery of inferior wall: Secondary | ICD-10-CM | POA: Diagnosis not present

## 2016-03-16 DIAGNOSIS — Z955 Presence of coronary angioplasty implant and graft: Secondary | ICD-10-CM | POA: Diagnosis not present

## 2016-03-18 ENCOUNTER — Encounter (HOSPITAL_COMMUNITY)
Admission: RE | Admit: 2016-03-18 | Discharge: 2016-03-18 | Disposition: A | Discharge status: Home / Self Care | Payer: Medicare Other | Source: Ambulatory Visit | Attending: Cardiovascular Disease | Admitting: Cardiovascular Disease

## 2016-03-18 DIAGNOSIS — I2119 ST elevation (STEMI) myocardial infarction involving other coronary artery of inferior wall: Secondary | ICD-10-CM | POA: Diagnosis not present

## 2016-03-18 DIAGNOSIS — Z955 Presence of coronary angioplasty implant and graft: Secondary | ICD-10-CM | POA: Diagnosis not present

## 2016-03-18 NOTE — Progress Notes (Signed)
Cardiac Individual Treatment Plan  Patient Details  Name: Darrell Marshall MRN: [id number] Date of Birth: [birth year] Referring Provider:    Initial Encounter Date:       CARDIAC REHAB PHASE II ORIENTATION from 01/26/2016 in [facility]   Date  01/26/16      Visit Diagnosis: No diagnosis found.  Patient's Home Medications on Admission:  Current outpatient prescriptions:  .  AIMSCO INSULIN SYR ULTRA THIN 31G X 5/16" 0.3 ML MISC, , Disp: , Rfl:  .  amLODipine (NORVASC) 10 MG tablet, Take 1 tablet (10 mg total) by mouth daily., Disp: 90 tablet, Rfl: 3 .  aspirin 81 MG chewable tablet, Chew 1 tablet (81 mg total) by mouth daily., Disp: , Rfl:  .  atorvastatin (LIPITOR) 80 MG tablet, Take 1 tablet (80 mg total) by mouth daily at 6 PM., Disp: 90 tablet, Rfl: 3 .  diltiazem (CARDIZEM CD) 180 MG 24 hr capsule, Take 180 mg by mouth daily., Disp: , Rfl:  .  famotidine (PEPCID) 40 MG tablet, Take 40 mg by mouth daily., Disp: , Rfl:  .  losartan (COZAAR) 50 MG tablet, Take 1 tablet (50 mg total) by mouth daily., Disp: 90 tablet, Rfl: 3 .  metFORMIN (GLUCOPHAGE) 500 MG tablet, Take 500 mg by mouth 2 (two) times daily., Disp: , Rfl:  .  metoprolol tartrate (LOPRESSOR) 25 MG tablet, Take 1 tablet (25 mg total) by mouth 2 (two) times daily., Disp: 180 tablet, Rfl: 3 .  nitroGLYCERIN (NITROSTAT) 0.4 MG SL tablet, Place 1 tablet (0.4 mg total) under the tongue every 5 (five) minutes x 3 doses as needed for chest pain., Disp: 25 tablet, Rfl: 3 .  ONE TOUCH ULTRA TEST test strip, , Disp: , Rfl:  .  ticagrelor (BRILINTA) 90 MG TABS tablet, Take 1 tablet (90 mg total) by mouth 2 (two) times daily., Disp: 180 tablet, Rfl: 3 .  vitamin C (ASCORBIC ACID) 500 MG tablet, Take 1,000 mg by mouth daily., Disp: , Rfl:  .  zinc gluconate 50 MG tablet, Take 50 mg by mouth daily., Disp: , Rfl:   Past Medical History: Past Medical History  Diagnosis Date  . Diabetes mellitus without complication  ([place])   . Hypertension   . ST elevation myocardial infarction (STEMI) of inferior wall ([place]) 01/12/2016  . Ischemic cardiomyopathy     Echo 01/13/2016 EF 40-45%  . CAD (coronary artery disease), native coronary artery     cath 01/12/2016 50% ost D1, 60% prox LCx, 100% distal RCA lesion treated with DES  . CKD (chronic kidney disease), stage III 01/15/2016    Tobacco Use: History  Smoking status  . Never Smoker   Smokeless tobacco  . Not on file    Labs:     Recent Review Flowsheet Data    Labs for ITP Cardiac and Pulmonary Rehab Latest Ref Rng 01/13/2016   Cholestrol 0 - 200 mg/dL 157   LDLCALC 0 - 99 mg/dL 82   HDL >40 mg/dL 33(L)   Trlycerides <150 mg/dL 208(H)   Hemoglobin A1c 4.8 - 5.6 % 6.4(H)      Capillary Blood Glucose: Lab Results  Component Value Date   GLUCAP 113* 01/15/2016   GLUCAP 124* 01/15/2016   GLUCAP 91 01/14/2016   GLUCAP 103* 01/14/2016   GLUCAP 128* 01/14/2016     Exercise Target Goals:    Exercise Program Goal: Individual exercise prescription set with THRR, safety & activity barriers. Participant demonstrates ability to understand  and report RPE using BORG scale, to self-measure pulse accurately, and to acknowledge the importance of the exercise prescription.  Exercise Prescription Goal: Starting with aerobic activity 30 plus minutes a day, 3 days per week for initial exercise prescription. Provide home exercise prescription and guidelines that participant acknowledges understanding prior to discharge.  Activity Barriers & Risk Stratification:     Activity Barriers & Cardiac Risk Stratification - 01/26/16 1617    Activity Barriers & Cardiac Risk Stratification   Activity Barriers None   Cardiac Risk Stratification High      6 Minute Walk:     6 Minute Walk      01/26/16 1500       6 Minute Walk   Phase Initial     Distance 950 feet     Walk Time 6 minutes     # of Rest Breaks 0     MPH 1.8     METS 2.38     RPE 13      Perceived Dyspnea  13     VO2 Peak 5.47     Symptoms No     Resting HR 78 bpm     Resting BP 130/70 mmHg     Max Ex. HR 100 bpm     Max Ex. BP 170/88 mmHg     2 Minute Post BP 148/78 mmHg     Pre/Post BP   Baseline BP 130/70 mmHg     6 Minute BP 170/88 mmHg     Pre/Post BP? Yes        Initial Exercise Prescription:     Initial Exercise Prescription - 01/26/16 1600    Date of Initial Exercise RX and Referring Provider   Date 01/26/16   NuStep   Level 2   Minutes 15   METs 1.5   Arm Ergometer   Level 1.5   Minutes 15   METs 1.5   Prescription Details   Frequency (times per week) 3   Intensity   THRR REST +  30   THRR 40-80% of Max Heartrate 103-128   Ratings of Perceived Exertion 11-13   Progression   Progression Continue to progress workloads to maintain intensity without signs/symptoms of physical distress.   Resistance Training   Training Prescription Yes   Weight 1.0   Reps 10-12      Perform Capillary Blood Glucose checks as needed.  Exercise Prescription Changes:      Exercise Prescription Changes      02/15/16 1300 03/18/16 1400         Exercise Review   Progression Yes Yes      Response to Exercise   Blood Pressure (Admit) 144/70 mmHg 148/66 mmHg      Blood Pressure (Exercise) 172/84 mmHg 150/70 mmHg      Blood Pressure (Exit) 140/60 mmHg 126/60 mmHg      Heart Rate (Admit) 74 bpm 98 bpm      Heart Rate (Exercise) 109 bpm 98 bpm      Heart Rate (Exit) 87 bpm 92 bpm      Rating of Perceived Exertion (Exercise) 13 8      Symptoms No No      Progression   Progression Continue to progress workloads to maintain intensity without signs/symptoms of physical distress. Continue to progress workloads to maintain intensity without signs/symptoms of physical distress.      Resistance Training   Training Prescription Yes Yes      Weight 2.0 2.0  Reps 10-12 10-12      Interval Training   Interval Training No No      NuStep   Level 2 2       Minutes 15 15      METs 3.71 2.3      Arm Ergometer   Level 1.5 1.8      Minutes 15 15      METs 2.2 2.8      Home Exercise Plan   Plans to continue exercise at  Home      Frequency  Add 2 additional days to program exercise sessions.         Exercise Comments:      Exercise Comments      03/18/16 1448           Exercise Comments Patient is progressing appropriately            Discharge Exercise Prescription (Final Exercise Prescription Changes):     Exercise Prescription Changes - 03/18/16 1400    Exercise Review   Progression Yes   Response to Exercise   Blood Pressure (Admit) 148/66 mmHg   Blood Pressure (Exercise) 150/70 mmHg   Blood Pressure (Exit) 126/60 mmHg   Heart Rate (Admit) 98 bpm   Heart Rate (Exercise) 98 bpm   Heart Rate (Exit) 92 bpm   Rating of Perceived Exertion (Exercise) 8   Symptoms No   Progression   Progression Continue to progress workloads to maintain intensity without signs/symptoms of physical distress.   Resistance Training   Training Prescription Yes   Weight 2.0   Reps 10-12   Interval Training   Interval Training No   NuStep   Level 2   Minutes 15   METs 2.3   Arm Ergometer   Level 1.8   Minutes 15   METs 2.8   Home Exercise Plan   Plans to continue exercise at Home   Frequency Add 2 additional days to program exercise sessions.      Nutrition:  Target Goals: Understanding of nutrition guidelines, daily intake of sodium 1500mg , cholesterol 200mg , calories 30% from fat and 7% or less from saturated fats, daily to have 5 or more servings of fruits and vegetables.  Biometrics:     Pre Biometrics - 01/26/16 1615    Pre Biometrics   Height 5' 8.5" (1.74 m)   Weight 244 lb 4.8 oz (110.814 kg)   Waist Circumference 49 inches   Hip Circumference 45 inches   Waist to Hip Ratio 1.09 %   BMI (Calculated) 36.7   Triceps Skinfold 18 mm   % Body Fat 36.2 %   Grip Strength 53 kg   Flexibility 0 in   Single Leg Stand  22 seconds       Nutrition Therapy Plan and Nutrition Goals:     Nutrition Therapy & Goals - 01/26/16 1620    Intervention Plan   Intervention Nutrition handout(s) given to patient.      Nutrition Discharge: Rate Your Plate Scores:     Nutrition Assessments - 01/26/16 1638    MEDFICTS Scores   Pre Score 31      Nutrition Goals Re-Evaluation:     Nutrition Goals Re-Evaluation      02/15/16 1438           Weight   Current Weight 244 lb (110.678 kg)       Intervention Plan   Intervention Continue to educate, counsel and set short/long term goals regarding individualized specific  personal dietary modifications.          Psychosocial: Target Goals: Acknowledge presence or absence of depression, maximize coping skills, provide positive support system. Participant is able to verbalize types and ability to use techniques and skills needed for reducing stress and depression.  Initial Review & Psychosocial Screening:     Initial Psych Review & Screening - 01/26/16 [place]? Yes   Barriers   Psychosocial barriers to participate in program There are no identifiable barriers or psychosocial needs.   Screening Interventions   Interventions Encouraged to exercise      Quality of Life Scores:     Quality of Life - 01/26/16 1616    Quality of Life Scores   Health/Function Pre 25.6 %   Socioeconomic Pre 30 %   Psych/Spiritual Pre 30 %   Family Pre 28 %   GLOBAL Pre 27.82 %      PHQ-9:     Recent Review Flowsheet Data    Depression screen [facility] 2/9 01/26/2016   Decreased Interest 0   Down, Depressed, Hopeless 1   PHQ - 2 Score 1   Altered sleeping 0   Tired, decreased energy 0   Change in appetite 0   Feeling bad or failure about yourself  0   Trouble concentrating 0   Moving slowly or fidgety/restless 0   Suicidal thoughts 0   PHQ-9 Score 1   Difficult doing work/chores Not difficult at all      Psychosocial  Evaluation and Intervention:     Psychosocial Evaluation - 01/26/16 1624    Psychosocial Evaluation & Interventions   Interventions --  Not depressed   Comments Patient scored low on PHQ-2 and PHQ-9      Psychosocial Re-Evaluation:     Psychosocial Re-Evaluation      02/15/16 1446           Psychosocial Re-Evaluation   Interventions Encouraged to attend Cardiac Rehabilitation for the exercise       Continued Psychosocial Services Needed No          Vocational Rehabilitation: Provide vocational rehab assistance to qualifying candidates.   Vocational Rehab Evaluation & Intervention:     Vocational Rehab - 01/26/16 1619    Initial Vocational Rehab Evaluation & Intervention   Assessment shows need for Vocational Rehabilitation No      Education: Education Goals: Education classes will be provided on a weekly basis, covering required topics. Participant will state understanding/return demonstration of topics presented.  Learning Barriers/Preferences:     Learning Barriers/Preferences - 01/26/16 1618    Learning Barriers/Preferences   Learning Barriers None   Learning Preferences Audio;Verbal Instruction      Education Topics: Hypertension, Hypertension Reduction -Define heart disease and high blood pressure. Discus how high blood pressure affects the body and ways to reduce high blood pressure.   Exercise and Your Heart -Discuss why it is important to exercise, the FITT principles of exercise, normal and abnormal responses to exercise, and how to exercise safely.   Angina -Discuss definition of angina, causes of angina, treatment of angina, and how to decrease risk of having angina.   Cardiac Medications -Review what the following cardiac medications are used for, how they affect the body, and side effects that may occur when taking the medications.  Medications include Aspirin, Beta blockers, calcium channel blockers, ACE Inhibitors, angiotensin receptor  blockers, diuretics, digoxin, and antihyperlipidemics.   Congestive Heart Failure -Discuss the  definition of CHF, how to live with CHF, the signs and symptoms of CHF, and how keep track of weight and sodium intake.   Heart Disease and Intimacy -Discus the effect sexual activity has on the heart, how changes occur during intimacy as we age, and safety during sexual activity.   Smoking Cessation / COPD -Discuss different methods to quit smoking, the health benefits of quitting smoking, and the definition of COPD.      CARDIAC REHAB PHASE II EXERCISE from 03/16/2016 in [facility]   Date  02/03/16   Educator  D. Weylyn Ricciuti   Instruction Review Code  2- meets goals/outcomes      Nutrition I: Fats -Discuss the types of cholesterol, what cholesterol does to the heart, and how cholesterol levels can be controlled.      CARDIAC REHAB PHASE II EXERCISE from 03/16/2016 in [facility]   Date  02/10/16   Educator  Russella Dar   Instruction Review Code  2- meets goals/outcomes      Nutrition II: Labels -Discuss the different components of food labels and how to read food label      CARDIAC REHAB PHASE II EXERCISE from 03/16/2016 in [facility]   Date  02/17/16   Educator  Russella Dar   Instruction Review Code  2- meets goals/outcomes      Heart Parts and Heart Disease -Discuss the anatomy of the heart, the pathway of blood circulation through the heart, and these are affected by heart disease.      CARDIAC REHAB PHASE II EXERCISE from 03/16/2016 in [facility]   Date  02/24/16   Educator  Russella Dar   Instruction Review Code  2- meets goals/outcomes      Stress I: Signs and Symptoms -Discuss the causes of stress, how stress may lead to anxiety and depression, and ways to limit stress.      CARDIAC REHAB PHASE II EXERCISE from 03/16/2016 in [facility]   Date  03/02/16   Educator  D.  Keyani Rigdon   Instruction Review Code  2- meets goals/outcomes      Stress II: Relaxation -Discuss different types of relaxation techniques to limit stress.      CARDIAC REHAB PHASE II EXERCISE from 03/16/2016 in [facility]   Date  03/09/16   Educator  Russella Dar   Instruction Review Code  2- meets goals/outcomes      Warning Signs of Stroke / TIA -Discuss definition of a stroke, what the signs and symptoms are of a stroke, and how to identify when someone is having stroke.          CARDIAC REHAB PHASE II EXERCISE from 03/16/2016 in [facility]   Date  03/16/16   Educator  Russella Dar   Instruction Review Code  2- meets goals/outcomes      Knowledge Questionnaire Score:     Knowledge Questionnaire Score - 01/26/16 1619    Knowledge Questionnaire Score   Pre Score 19/24      Core Components/Risk Factors/Patient Goals at Admission:     Personal Goals and Risk Factors at Admission - 01/26/16 1621    Core Components/Risk Factors/Patient Goals on Admission    Weight Management Obesity   Sedentary --  Walks at home   Tobacco Cessation --  Quit 01/11/1984   Diabetes Yes   Intervention Provide education about signs/symptoms and action to take for hypo/hyperglycemia.;Provide education about proper  nutrition, including hydration, and aerobic/resistive exercise prescription along with prescribed medications to achieve blood glucose in normal ranges: Fasting glucose 65-99 mg/dL   Expected Outcomes Long Term: Attainment of HbA1C < 7%.;Short Term: Participant verbalizes understanding of the signs/symptoms and immediate care of hyper/hypoglycemia, proper foot care and importance of medication, aerobic/resistive exercise and nutrition plan for blood glucose control.   Lipids Yes   Intervention Provide education and support for participant on nutrition & aerobic/resistive exercise along with prescribed medications to achieve LDL 70mg , HDL >40mg .    Expected Outcomes Long Term: Cholesterol controlled with medications as prescribed, with individualized exercise RX and with personalized nutrition plan. Value goals: LDL < 70mg , HDL > 40 mg.;Short Term: Participant states understanding of desired cholesterol values and is compliant with medications prescribed. Participant is following exercise prescription and nutrition guidelines.   Stress --  no   Personal Goal Other --  Breath better, Increas activity and lose weight      Core Components/Risk Factors/Patient Goals Review:      Goals and Risk Factor Review      01/26/16 1623 02/15/16 1446 03/18/16 1427       Core Components/Risk Factors/Patient Goals Review   Personal Goals Review Weight Management/Obesity Weight Management/Obesity;Lipids;Diabetes Weight Management/Obesity;Lipids;Diabetes     Review  Will continue to education patient on these topics.  Patient is maintaining weight at 244 lbs Will continue to education patient on these topics.  Patient has lost 4 lbs since starting program     Expected Outcomes   lose weight. BS and lipids WNL        Core Components/Risk Factors/Patient Goals at Discharge (Final Review):      Goals and Risk Factor Review - 03/18/16 1427    Core Components/Risk Factors/Patient Goals Review   Personal Goals Review Weight Management/Obesity;Lipids;Diabetes   Review Will continue to education patient on these topics.  Patient has lost 4 lbs since starting program   Expected Outcomes lose weight. BS and lipids WNL      ITP Comments:     ITP Comments      01/26/16 1638           ITP Comments Patient is a 81 year old male that is widowed. He enjoys golfing and pitching horse shoes. Patient did not dispaly any signs of depression. Patient very independent and self sufficient.           Comments: Patient is expected to progress towards personal goals after completing sessions. Recommend continued exercise, lifestyle modifications, education  and utilization of home exercise plan to increase stamina and strength.

## 2016-03-21 ENCOUNTER — Encounter (HOSPITAL_COMMUNITY)
Admission: RE | Admit: 2016-03-21 | Discharge: 2016-03-21 | Disposition: A | Discharge status: Home / Self Care | Payer: Medicare Other | Source: Ambulatory Visit | Attending: Cardiovascular Disease | Admitting: Cardiovascular Disease

## 2016-03-21 DIAGNOSIS — I2119 ST elevation (STEMI) myocardial infarction involving other coronary artery of inferior wall: Secondary | ICD-10-CM | POA: Diagnosis not present

## 2016-03-21 DIAGNOSIS — Z955 Presence of coronary angioplasty implant and graft: Secondary | ICD-10-CM | POA: Diagnosis not present

## 2016-03-23 ENCOUNTER — Encounter (HOSPITAL_COMMUNITY)
Admission: RE | Admit: 2016-03-23 | Discharge: 2016-03-23 | Disposition: A | Discharge status: Home / Self Care | Payer: Medicare Other | Source: Ambulatory Visit | Attending: Cardiovascular Disease | Admitting: Cardiovascular Disease

## 2016-03-23 DIAGNOSIS — Z955 Presence of coronary angioplasty implant and graft: Secondary | ICD-10-CM | POA: Diagnosis not present

## 2016-03-23 DIAGNOSIS — I2119 ST elevation (STEMI) myocardial infarction involving other coronary artery of inferior wall: Secondary | ICD-10-CM | POA: Diagnosis not present

## 2016-03-25 ENCOUNTER — Encounter (HOSPITAL_COMMUNITY)
Admission: RE | Admit: 2016-03-25 | Discharge: 2016-03-25 | Disposition: A | Discharge status: Home / Self Care | Payer: Medicare Other | Source: Ambulatory Visit | Attending: Cardiovascular Disease | Admitting: Cardiovascular Disease

## 2016-03-25 DIAGNOSIS — I2119 ST elevation (STEMI) myocardial infarction involving other coronary artery of inferior wall: Secondary | ICD-10-CM | POA: Diagnosis not present

## 2016-03-25 DIAGNOSIS — Z955 Presence of coronary angioplasty implant and graft: Secondary | ICD-10-CM | POA: Diagnosis not present

## 2016-03-28 ENCOUNTER — Encounter (HOSPITAL_COMMUNITY)
Admission: RE | Admit: 2016-03-28 | Discharge: 2016-03-28 | Disposition: A | Discharge status: Home / Self Care | Payer: Medicare Other | Source: Ambulatory Visit | Attending: Cardiovascular Disease | Admitting: Cardiovascular Disease

## 2016-03-28 ENCOUNTER — Encounter: Payer: Self-pay | Admitting: Cardiology

## 2016-03-28 DIAGNOSIS — I2119 ST elevation (STEMI) myocardial infarction involving other coronary artery of inferior wall: Secondary | ICD-10-CM | POA: Diagnosis not present

## 2016-03-28 DIAGNOSIS — Z955 Presence of coronary angioplasty implant and graft: Secondary | ICD-10-CM | POA: Diagnosis not present

## 2016-03-30 ENCOUNTER — Encounter (HOSPITAL_COMMUNITY)
Admission: RE | Admit: 2016-03-30 | Discharge: 2016-03-30 | Disposition: A | Discharge status: Home / Self Care | Payer: Medicare Other | Source: Ambulatory Visit | Attending: Cardiovascular Disease | Admitting: Cardiovascular Disease

## 2016-03-30 DIAGNOSIS — Z955 Presence of coronary angioplasty implant and graft: Secondary | ICD-10-CM | POA: Diagnosis not present

## 2016-03-30 DIAGNOSIS — I2119 ST elevation (STEMI) myocardial infarction involving other coronary artery of inferior wall: Secondary | ICD-10-CM | POA: Diagnosis not present

## 2016-04-01 ENCOUNTER — Encounter (HOSPITAL_COMMUNITY)
Admission: RE | Admit: 2016-04-01 | Discharge: 2016-04-01 | Disposition: A | Discharge status: Home / Self Care | Payer: Medicare Other | Source: Ambulatory Visit | Attending: Cardiovascular Disease | Admitting: Cardiovascular Disease

## 2016-04-01 DIAGNOSIS — I2119 ST elevation (STEMI) myocardial infarction involving other coronary artery of inferior wall: Secondary | ICD-10-CM | POA: Diagnosis not present

## 2016-04-01 DIAGNOSIS — Z955 Presence of coronary angioplasty implant and graft: Secondary | ICD-10-CM | POA: Diagnosis not present

## 2016-04-04 ENCOUNTER — Encounter (HOSPITAL_COMMUNITY)
Admission: RE | Admit: 2016-04-04 | Discharge: 2016-04-04 | Disposition: A | Discharge status: Home / Self Care | Payer: Medicare Other | Source: Ambulatory Visit | Attending: Cardiovascular Disease | Admitting: Cardiovascular Disease

## 2016-04-04 DIAGNOSIS — Z955 Presence of coronary angioplasty implant and graft: Secondary | ICD-10-CM | POA: Diagnosis not present

## 2016-04-04 DIAGNOSIS — I2119 ST elevation (STEMI) myocardial infarction involving other coronary artery of inferior wall: Secondary | ICD-10-CM | POA: Diagnosis not present

## 2016-04-06 ENCOUNTER — Encounter (HOSPITAL_COMMUNITY)
Admission: RE | Admit: 2016-04-06 | Discharge: 2016-04-06 | Disposition: A | Discharge status: Home / Self Care | Payer: Medicare Other | Source: Ambulatory Visit | Attending: Cardiovascular Disease | Admitting: Cardiovascular Disease

## 2016-04-06 DIAGNOSIS — I2119 ST elevation (STEMI) myocardial infarction involving other coronary artery of inferior wall: Secondary | ICD-10-CM | POA: Diagnosis not present

## 2016-04-06 DIAGNOSIS — Z955 Presence of coronary angioplasty implant and graft: Secondary | ICD-10-CM | POA: Diagnosis not present

## 2016-04-08 ENCOUNTER — Encounter (HOSPITAL_COMMUNITY)
Admission: RE | Admit: 2016-04-08 | Discharge: 2016-04-08 | Disposition: A | Discharge status: Home / Self Care | Payer: Medicare Other | Source: Ambulatory Visit | Attending: Cardiovascular Disease | Admitting: Cardiovascular Disease

## 2016-04-08 DIAGNOSIS — Z955 Presence of coronary angioplasty implant and graft: Secondary | ICD-10-CM | POA: Diagnosis not present

## 2016-04-08 DIAGNOSIS — I2119 ST elevation (STEMI) myocardial infarction involving other coronary artery of inferior wall: Secondary | ICD-10-CM | POA: Diagnosis not present

## 2016-04-11 ENCOUNTER — Encounter (HOSPITAL_COMMUNITY)
Admission: RE | Admit: 2016-04-11 | Discharge: 2016-04-11 | Disposition: A | Discharge status: Home / Self Care | Payer: Medicare Other | Source: Ambulatory Visit | Attending: Cardiovascular Disease | Admitting: Cardiovascular Disease

## 2016-04-11 DIAGNOSIS — I2119 ST elevation (STEMI) myocardial infarction involving other coronary artery of inferior wall: Secondary | ICD-10-CM | POA: Diagnosis not present

## 2016-04-11 DIAGNOSIS — Z955 Presence of coronary angioplasty implant and graft: Secondary | ICD-10-CM | POA: Diagnosis not present

## 2016-04-13 ENCOUNTER — Encounter (HOSPITAL_COMMUNITY)
Admission: RE | Admit: 2016-04-13 | Discharge: 2016-04-13 | Disposition: A | Discharge status: Home / Self Care | Payer: Medicare Other | Source: Ambulatory Visit | Attending: Cardiovascular Disease | Admitting: Cardiovascular Disease

## 2016-04-13 DIAGNOSIS — I2119 ST elevation (STEMI) myocardial infarction involving other coronary artery of inferior wall: Secondary | ICD-10-CM | POA: Diagnosis not present

## 2016-04-13 DIAGNOSIS — Z955 Presence of coronary angioplasty implant and graft: Secondary | ICD-10-CM | POA: Diagnosis not present

## 2016-04-13 NOTE — Progress Notes (Signed)
Daily Session Note  Patient Details  Name: Darrell Marshall MRN: [id number] Date of Birth: 24-Jun-1935 Referring Provider:    Encounter Date: 04/13/2016  Check In:     Session Check In - 04/13/16 1100    Check-In   Location [facility]   Staff Present Dorian Duval Angelina Pih, MS, EP, [facility], Exercise Physiologist;Christy Oletta Lamas, RN, BSN  Nils Flack, EP   Supervising physician immediately available to respond to emergencies See telemetry face sheet for immediately available MD   Medication changes reported     No   Fall or balance concerns reported    No   Warm-up and Cool-down Not performed (comment)   Resistance Training Performed Yes   VAD Patient? No   Pain Assessment   Currently in Pain? No/denies   Multiple Pain Sites No      Capillary Blood Glucose: No results found for this or any previous visit (from the past 24 hour(s)).   Goals Met:  Independence with exercise equipment Improved SOB with ADL's Exercise tolerated well No report of cardiac concerns or symptoms Strength training completed today  Goals Unmet:  RPE BP HR  Comments: Patient is progressing appropriately. Checkout lime: 12:05pm   Dr. Kate Sable is Medical Director for [place] and Pulmonary Rehab.

## 2016-04-15 ENCOUNTER — Encounter (HOSPITAL_COMMUNITY)
Admission: RE | Admit: 2016-04-15 | Discharge: 2016-04-15 | Disposition: A | Discharge status: Home / Self Care | Payer: Medicare Other | Source: Ambulatory Visit | Attending: Cardiovascular Disease | Admitting: Cardiovascular Disease

## 2016-04-15 DIAGNOSIS — Z955 Presence of coronary angioplasty implant and graft: Secondary | ICD-10-CM

## 2016-04-15 DIAGNOSIS — I2119 ST elevation (STEMI) myocardial infarction involving other coronary artery of inferior wall: Secondary | ICD-10-CM

## 2016-04-15 NOTE — Progress Notes (Signed)
Daily Session Note  Patient Details  Name: MYKLE PASCUA MRN: [id number] Date of Birth: 1935-10-29 Referring Provider:    Encounter Date: 04/15/2016  Check In:     Session Check In - 04/15/16 1100    Check-In   Location [facility]   Staff Present Malique Driskill Angelina Pih, MS, EP, [facility], Exercise Physiologist;Christy Oletta Lamas, RN, BSN  Nils Flack, EP   Supervising physician immediately available to respond to emergencies See telemetry face sheet for immediately available MD   Medication changes reported     No   Fall or balance concerns reported    No   Warm-up and Cool-down Not performed (comment)   Resistance Training Performed Yes      Capillary Blood Glucose: No results found for this or any previous visit (from the past 24 hour(s)).   Goals Met:  Independence with exercise equipment Exercise tolerated well No report of cardiac concerns or symptoms Strength training completed today  Goals Unmet:  RPE BP HR  Comments: Patient is progressing appropriately. Check out: 12 noon   Dr. Kate Sable is Medical Director for [place] and Pulmonary Rehab.

## 2016-04-17 NOTE — Progress Notes (Signed)
Cardiac Individual Treatment Plan  Patient Details  Name: Darrell Marshall MRN: [id number] Date of Birth: [birth year] Referring Provider:    Initial Encounter Date:       CARDIAC REHAB PHASE II ORIENTATION from 01/26/2016 in [place]   Date  01/26/16      Visit Diagnosis: ST elevation myocardial infarction (STEMI) of inferior wall ([place])  Stented coronary artery  Patient's Home Medications on Admission:  Current outpatient prescriptions:  .  AIMSCO INSULIN SYR ULTRA THIN 31G X 5/16" 0.3 ML MISC, , Disp: , Rfl:  .  amLODipine (NORVASC) 10 MG tablet, Take 1 tablet (10 mg total) by mouth daily., Disp: 90 tablet, Rfl: 3 .  aspirin 81 MG chewable tablet, Chew 1 tablet (81 mg total) by mouth daily., Disp: , Rfl:  .  atorvastatin (LIPITOR) 80 MG tablet, Take 1 tablet (80 mg total) by mouth daily at 6 PM., Disp: 90 tablet, Rfl: 3 .  diltiazem (CARDIZEM CD) 180 MG 24 hr capsule, Take 180 mg by mouth daily., Disp: , Rfl:  .  famotidine (PEPCID) 40 MG tablet, Take 40 mg by mouth daily., Disp: , Rfl:  .  losartan (COZAAR) 50 MG tablet, Take 1 tablet (50 mg total) by mouth daily., Disp: 90 tablet, Rfl: 3 .  metFORMIN (GLUCOPHAGE) 500 MG tablet, Take 500 mg by mouth 2 (two) times daily., Disp: , Rfl:  .  metoprolol tartrate (LOPRESSOR) 25 MG tablet, Take 1 tablet (25 mg total) by mouth 2 (two) times daily., Disp: 180 tablet, Rfl: 3 .  nitroGLYCERIN (NITROSTAT) 0.4 MG SL tablet, Place 1 tablet (0.4 mg total) under the tongue every 5 (five) minutes x 3 doses as needed for chest pain., Disp: 25 tablet, Rfl: 3 .  ONE TOUCH ULTRA TEST test strip, , Disp: , Rfl:  .  ticagrelor (BRILINTA) 90 MG TABS tablet, Take 1 tablet (90 mg total) by mouth 2 (two) times daily., Disp: 180 tablet, Rfl: 3 .  vitamin C (ASCORBIC ACID) 500 MG tablet, Take 1,000 mg by mouth daily., Disp: , Rfl:  .  zinc gluconate 50 MG tablet, Take 50 mg by mouth daily., Disp: , Rfl:   Past Medical History: Past  Medical History  Diagnosis Date  . Diabetes mellitus without complication ([place])   . Hypertension   . ST elevation myocardial infarction (STEMI) of inferior wall ([place]) 01/12/2016  . Ischemic cardiomyopathy     Echo 01/13/2016 EF 40-45%  . CAD (coronary artery disease), native coronary artery     cath 01/12/2016 50% ost D1, 60% prox LCx, 100% distal RCA lesion treated with DES  . CKD (chronic kidney disease), stage III 01/15/2016    Tobacco Use: History  Smoking status  . Never Smoker   Smokeless tobacco  . Not on file    Labs:     Recent Review Flowsheet Data    Labs for ITP Cardiac and Pulmonary Rehab Latest Ref Rng 01/13/2016   Cholestrol 0 - 200 mg/dL 157   LDLCALC 0 - 99 mg/dL 82   HDL >40 mg/dL 33(L)   Trlycerides <150 mg/dL 208(H)   Hemoglobin A1c 4.8 - 5.6 % 6.4(H)      Capillary Blood Glucose: Lab Results  Component Value Date   GLUCAP 113* 01/15/2016   GLUCAP 124* 01/15/2016   GLUCAP 91 01/14/2016   GLUCAP 103* 01/14/2016   GLUCAP 128* 01/14/2016     Exercise Target Goals:    Exercise Program Goal: Individual exercise prescription set with  THRR, safety & activity barriers. Participant demonstrates ability to understand and report RPE using BORG scale, to self-measure pulse accurately, and to acknowledge the importance of the exercise prescription.  Exercise Prescription Goal: Starting with aerobic activity 30 plus minutes a day, 3 days per week for initial exercise prescription. Provide home exercise prescription and guidelines that participant acknowledges understanding prior to discharge.  Activity Barriers & Risk Stratification:     Activity Barriers & Cardiac Risk Stratification - 01/26/16 1617    Activity Barriers & Cardiac Risk Stratification   Activity Barriers None   Cardiac Risk Stratification High      6 Minute Walk:     6 Minute Walk      01/26/16 1500       6 Minute Walk   Phase Initial     Distance 950 feet     Walk Time 6  minutes     # of Rest Breaks 0     MPH 1.8     METS 2.38     RPE 13     Perceived Dyspnea  13     VO2 Peak 5.47     Symptoms No     Resting HR 78 bpm     Resting BP 130/70 mmHg     Max Ex. HR 100 bpm     Max Ex. BP 170/88 mmHg     2 Minute Post BP 148/78 mmHg     Pre/Post BP   Baseline BP 130/70 mmHg     6 Minute BP 170/88 mmHg     Pre/Post BP? Yes        Initial Exercise Prescription:     Initial Exercise Prescription - 01/26/16 1600    Date of Initial Exercise RX and Referring Provider   Date 01/26/16   NuStep   Level 2   Minutes 15   METs 1.5   Arm Ergometer   Level 1.5   Minutes 15   METs 1.5   Prescription Details   Frequency (times per week) 3   Intensity   THRR REST +  30   THRR 40-80% of Max Heartrate 103-128   Ratings of Perceived Exertion 11-13   Progression   Progression Continue to progress workloads to maintain intensity without signs/symptoms of physical distress.   Resistance Training   Training Prescription Yes   Weight 1.0   Reps 10-12      Perform Capillary Blood Glucose checks as needed.  Exercise Prescription Changes:      Exercise Prescription Changes      02/15/16 1300 03/18/16 1400 04/17/16 1300       Exercise Review   Progression Yes Yes Yes     Response to Exercise   Blood Pressure (Admit) 144/70 mmHg 148/66 mmHg 150/80 mmHg     Blood Pressure (Exercise) 172/84 mmHg 150/70 mmHg 164/70 mmHg     Blood Pressure (Exit) 140/60 mmHg 126/60 mmHg 134/70 mmHg     Heart Rate (Admit) 74 bpm 98 bpm 104 bpm     Heart Rate (Exercise) 109 bpm 98 bpm 97 bpm     Heart Rate (Exit) 87 bpm 92 bpm 90 bpm     Rating of Perceived Exertion (Exercise) 13 8 13      Symptoms No No No     Duration   Progress to 30 minutes of continuous aerobic without signs/symptoms of physical distress     Intensity   Rest + 30     Progression   Progression Continue  to progress workloads to maintain intensity without signs/symptoms of physical distress. Continue  to progress workloads to maintain intensity without signs/symptoms of physical distress. Continue to progress workloads to maintain intensity without signs/symptoms of physical distress.     Resistance Training   Training Prescription Yes Yes Yes     Weight 2.0 2.0 3.0     Reps 10-12 10-12 10-12     Interval Training   Interval Training No No No     NuStep   Level 2 2 2      Minutes 15 15 15      METs 3.71 2.3 3.8     Arm Ergometer   Level 1.5 1.8 2     Minutes 15 15 15      METs 2.2 2.8 2.6     Home Exercise Plan   Plans to continue exercise at  [place] 2 additional days to program exercise sessions. Add 2 additional days to program exercise sessions.        Exercise Comments:      Exercise Comments      03/18/16 1448 04/17/16 1346         Exercise Comments Patient is progressing appropriately  Patient is progressing appropriately.           Discharge Exercise Prescription (Final Exercise Prescription Changes):     Exercise Prescription Changes - 04/17/16 1300    Exercise Review   Progression Yes   Response to Exercise   Blood Pressure (Admit) 150/80 mmHg   Blood Pressure (Exercise) 164/70 mmHg   Blood Pressure (Exit) 134/70 mmHg   Heart Rate (Admit) 104 bpm   Heart Rate (Exercise) 97 bpm   Heart Rate (Exit) 90 bpm   Rating of Perceived Exertion (Exercise) 13   Symptoms No   Duration Progress to 30 minutes of continuous aerobic without signs/symptoms of physical distress   Intensity Rest + 30   Progression   Progression Continue to progress workloads to maintain intensity without signs/symptoms of physical distress.   Resistance Training   Training Prescription Yes   Weight 3.0   Reps 10-12   Interval Training   Interval Training No   NuStep   Level 2   Minutes 15   METs 3.8   Arm Ergometer   Level 2   Minutes 15   METs 2.6   Home Exercise Plan   Plans to continue exercise at Home   Frequency Add 2 additional days to program  exercise sessions.      Nutrition:  Target Goals: Understanding of nutrition guidelines, daily intake of sodium 1500mg , cholesterol 200mg , calories 30% from fat and 7% or less from saturated fats, daily to have 5 or more servings of fruits and vegetables.  Biometrics:     Pre Biometrics - 01/26/16 1615    Pre Biometrics   Height 5' 8.5" (1.74 m)   Weight 244 lb 4.8 oz (110.814 kg)   Waist Circumference 49 inches   Hip Circumference 45 inches   Waist to Hip Ratio 1.09 %   BMI (Calculated) 36.7   Triceps Skinfold 18 mm   % Body Fat 36.2 %   Grip Strength 53 kg   Flexibility 0 in   Single Leg Stand 22 seconds       Nutrition Therapy Plan and Nutrition Goals:     Nutrition Therapy & Goals - 01/26/16 1620    Intervention Plan   Intervention Nutrition handout(s) given to patient.  Nutrition Discharge: Rate Your Plate Scores:     Nutrition Assessments - 01/26/16 1638    MEDFICTS Scores   Pre Score 31      Nutrition Goals Re-Evaluation:     Nutrition Goals Re-Evaluation      02/15/16 1438           Weight   Current Weight 244 lb (110.678 kg)       Intervention Plan   Intervention Continue to educate, counsel and set short/long term goals regarding individualized specific personal dietary modifications.          Psychosocial: Target Goals: Acknowledge presence or absence of depression, maximize coping skills, provide positive support system. Participant is able to verbalize types and ability to use techniques and skills needed for reducing stress and depression.  Initial Review & Psychosocial Screening:     Initial Psych Review & Screening - 01/26/16 [place]? Yes   Barriers   Psychosocial barriers to participate in program There are no identifiable barriers or psychosocial needs.   Screening Interventions   Interventions Encouraged to exercise      Quality of Life Scores:     Quality of Life - 01/26/16  1616    Quality of Life Scores   Health/Function Pre 25.6 %   Socioeconomic Pre 30 %   Psych/Spiritual Pre 30 %   Family Pre 28 %   GLOBAL Pre 27.82 %      PHQ-9:     Recent Review Flowsheet Data    Depression screen [facility] 2/9 01/26/2016   Decreased Interest 0   Down, Depressed, Hopeless 1   PHQ - 2 Score 1   Altered sleeping 0   Tired, decreased energy 0   Change in appetite 0   Feeling bad or failure about yourself  0   Trouble concentrating 0   Moving slowly or fidgety/restless 0   Suicidal thoughts 0   PHQ-9 Score 1   Difficult doing work/chores Not difficult at all      Psychosocial Evaluation and Intervention:     Psychosocial Evaluation - 01/26/16 1624    Psychosocial Evaluation & Interventions   Interventions --  Not depressed   Comments Patient scored low on PHQ-2 and PHQ-9      Psychosocial Re-Evaluation:     Psychosocial Re-Evaluation      02/15/16 1446 04/11/16 1510         Psychosocial Re-Evaluation   Interventions Encouraged to attend Cardiac Rehabilitation for the exercise Encouraged to attend Cardiac Rehabilitation for the exercise      Continued Psychosocial Services Needed No No         Vocational Rehabilitation: Provide vocational rehab assistance to qualifying candidates.   Vocational Rehab Evaluation & Intervention:     Vocational Rehab - 01/26/16 1619    Initial Vocational Rehab Evaluation & Intervention   Assessment shows need for Vocational Rehabilitation No      Education: Education Goals: Education classes will be provided on a weekly basis, covering required topics. Participant will state understanding/return demonstration of topics presented.  Learning Barriers/Preferences:     Learning Barriers/Preferences - 01/26/16 1618    Learning Barriers/Preferences   Learning Barriers None   Learning Preferences Audio;Verbal Instruction      Education Topics: Hypertension, Hypertension Reduction -Define heart disease and  high blood pressure. Discus how high blood pressure affects the body and ways to reduce high blood pressure.      CARDIAC REHAB  PHASE II EXERCISE from 04/13/2016 in [place]   Date  03/23/16   Educator  Benay Pillow   Instruction Review Code  2- meets goals/outcomes      Exercise and Your Heart -Discuss why it is important to exercise, the FITT principles of exercise, normal and abnormal responses to exercise, and how to exercise safely.      CARDIAC REHAB PHASE II EXERCISE from 04/13/2016 in [place]   Date  03/30/16   Educator  DC   Instruction Review Code  2- meets goals/outcomes      Angina -Discuss definition of angina, causes of angina, treatment of angina, and how to decrease risk of having angina.      CARDIAC REHAB PHASE II EXERCISE from 04/13/2016 in [place]   Date  04/06/16   Educator  -- [D Timoteo Carreiro]   Instruction Review Code  2- meets goals/outcomes      Cardiac Medications -Review what the following cardiac medications are used for, how they affect the body, and side effects that may occur when taking the medications.  Medications include Aspirin, Beta blockers, calcium channel blockers, ACE Inhibitors, angiotensin receptor blockers, diuretics, digoxin, and antihyperlipidemics.      CARDIAC REHAB PHASE II EXERCISE from 04/13/2016 in [place]   Date  04/13/16   Educator  D. Thos Matsumoto   Instruction Review Code  2- meets goals/outcomes      Congestive Heart Failure -Discuss the definition of CHF, how to live with CHF, the signs and symptoms of CHF, and how keep track of weight and sodium intake.   Heart Disease and Intimacy -Discus the effect sexual activity has on the heart, how changes occur during intimacy as we age, and safety during sexual activity.   Smoking Cessation / COPD -Discuss different methods to quit smoking, the health benefits of quitting smoking, and the  definition of COPD.      CARDIAC REHAB PHASE II EXERCISE from 04/13/2016 in [place]   Date  02/03/16   Educator  D. Toshiro Hanken   Instruction Review Code  2- meets goals/outcomes      Nutrition I: Fats -Discuss the types of cholesterol, what cholesterol does to the heart, and how cholesterol levels can be controlled.      CARDIAC REHAB PHASE II EXERCISE from 04/13/2016 in [place]   Date  02/10/16   Educator  Russella Dar   Instruction Review Code  2- meets goals/outcomes      Nutrition II: Labels -Discuss the different components of food labels and how to read food label      [place] from 04/13/2016 in [place]   Date  02/17/16   Educator  Russella Dar   Instruction Review Code  2- meets goals/outcomes      Heart Parts and Heart Disease -Discuss the anatomy of the heart, the pathway of blood circulation through the heart, and these are affected by heart disease.      CARDIAC REHAB PHASE II EXERCISE from 04/13/2016 in [place]   Date  02/24/16   Educator  Russella Dar   Instruction Review Code  2- meets goals/outcomes      Stress I: Signs and Symptoms -Discuss the causes of stress, how stress may lead to anxiety and depression, and ways to limit stress.      CARDIAC REHAB PHASE II EXERCISE from 04/13/2016 in [place]  CARDIAC REHABILITATION   Date  03/02/16   Educator  D. Tarvares Lant   Instruction Review Code  2- meets goals/outcomes      Stress II: Relaxation -Discuss different types of relaxation techniques to limit stress.      CARDIAC REHAB PHASE II EXERCISE from 04/13/2016 in [place]   Date  03/09/16   Educator  Russella Dar   Instruction Review Code  2- meets goals/outcomes      Warning Signs of Stroke / TIA -Discuss definition of a stroke, what the signs and symptoms are of a stroke, and how to identify when someone is having  stroke.          CARDIAC REHAB PHASE II EXERCISE from 04/13/2016 in [place]   Date  03/16/16   Educator  Russella Dar   Instruction Review Code  2- meets goals/outcomes      Knowledge Questionnaire Score:     Knowledge Questionnaire Score - 01/26/16 1619    Knowledge Questionnaire Score   Pre Score 19/24      Core Components/Risk Factors/Patient Goals at Admission:     Personal Goals and Risk Factors at Admission - 01/26/16 1621    Core Components/Risk Factors/Patient Goals on Admission    Weight Management Obesity   Sedentary --  Walks at home   Tobacco Cessation --  Quit 01/11/1984   Diabetes Yes   Intervention Provide education about signs/symptoms and action to take for hypo/hyperglycemia.;Provide education about proper nutrition, including hydration, and aerobic/resistive exercise prescription along with prescribed medications to achieve blood glucose in normal ranges: Fasting glucose 65-99 mg/dL   Expected Outcomes Long Term: Attainment of HbA1C < 7%.;Short Term: Participant verbalizes understanding of the signs/symptoms and immediate care of hyper/hypoglycemia, proper foot care and importance of medication, aerobic/resistive exercise and nutrition plan for blood glucose control.   Lipids Yes   Intervention Provide education and support for participant on nutrition & aerobic/resistive exercise along with prescribed medications to achieve LDL 70mg , HDL >40mg .   Expected Outcomes Long Term: Cholesterol controlled with medications as prescribed, with individualized exercise RX and with personalized nutrition plan. Value goals: LDL < 70mg , HDL > 40 mg.;Short Term: Participant states understanding of desired cholesterol values and is compliant with medications prescribed. Participant is following exercise prescription and nutrition guidelines.   Stress --  no   Personal Goal Other --  Breath better, Increas activity and lose weight      Core  Components/Risk Factors/Patient Goals Review:      Goals and Risk Factor Review      01/26/16 1623 02/15/16 1446 03/18/16 1427 04/11/16 1510     Core Components/Risk Factors/Patient Goals Review   Personal Goals Review Weight Management/Obesity Weight Management/Obesity;Lipids;Diabetes Weight Management/Obesity;Lipids;Diabetes Weight Management/Obesity;Lipids;Diabetes    Review  Will continue to education patient on these topics.  Patient is maintaining weight at 244 lbs Will continue to education patient on these topics.  Patient has lost 4 lbs since starting program Will continue to education patient on these topics.  Patient weight is maintaining at this time. BS has been WNL.     Expected Outcomes   lose weight. BS and lipids WNL lose weight. BS and lipids WNL       Core Components/Risk Factors/Patient Goals at Discharge (Final Review):      Goals and Risk Factor Review - 04/11/16 1510    Core Components/Risk Factors/Patient Goals Review   Personal Goals Review Weight Management/Obesity;Lipids;Diabetes   Review Will continue to  education patient on these topics.  Patient weight is maintaining at this time. BS has been WNL.    Expected Outcomes lose weight. BS and lipids WNL      ITP Comments:     ITP Comments      01/26/16 1638           ITP Comments Patient is a 81 year old male that is widowed. He enjoys golfing and pitching horse shoes. Patient did not dispaly any signs of depression. Patient very independent and self sufficient.           Comments: Patient is doing well with the program. We will continue to monitor for progress.

## 2016-04-18 ENCOUNTER — Encounter (HOSPITAL_COMMUNITY): Payer: Medicare Other

## 2016-04-19 DIAGNOSIS — I5022 Chronic systolic (congestive) heart failure: Secondary | ICD-10-CM | POA: Diagnosis not present

## 2016-04-19 DIAGNOSIS — I129 Hypertensive chronic kidney disease with stage 1 through stage 4 chronic kidney disease, or unspecified chronic kidney disease: Secondary | ICD-10-CM | POA: Diagnosis not present

## 2016-04-19 DIAGNOSIS — I25119 Atherosclerotic heart disease of native coronary artery with unspecified angina pectoris: Secondary | ICD-10-CM | POA: Diagnosis not present

## 2016-04-19 DIAGNOSIS — E1121 Type 2 diabetes mellitus with diabetic nephropathy: Secondary | ICD-10-CM | POA: Diagnosis not present

## 2016-04-20 ENCOUNTER — Encounter (HOSPITAL_COMMUNITY)
Admission: RE | Admit: 2016-04-20 | Discharge: 2016-04-20 | Disposition: A | Discharge status: Home / Self Care | Payer: Medicare Other | Source: Ambulatory Visit | Attending: Cardiovascular Disease | Admitting: Cardiovascular Disease

## 2016-04-20 DIAGNOSIS — I5022 Chronic systolic (congestive) heart failure: Secondary | ICD-10-CM | POA: Diagnosis not present

## 2016-04-20 DIAGNOSIS — I2119 ST elevation (STEMI) myocardial infarction involving other coronary artery of inferior wall: Secondary | ICD-10-CM | POA: Diagnosis not present

## 2016-04-20 DIAGNOSIS — Z955 Presence of coronary angioplasty implant and graft: Secondary | ICD-10-CM | POA: Diagnosis not present

## 2016-04-20 DIAGNOSIS — E1121 Type 2 diabetes mellitus with diabetic nephropathy: Secondary | ICD-10-CM | POA: Diagnosis not present

## 2016-04-20 DIAGNOSIS — I25119 Atherosclerotic heart disease of native coronary artery with unspecified angina pectoris: Secondary | ICD-10-CM | POA: Diagnosis not present

## 2016-04-20 DIAGNOSIS — I129 Hypertensive chronic kidney disease with stage 1 through stage 4 chronic kidney disease, or unspecified chronic kidney disease: Secondary | ICD-10-CM | POA: Diagnosis not present

## 2016-04-20 NOTE — Progress Notes (Addendum)
Daily Session Note  Patient Details  Name: Darrell Marshall MRN: [id number] Date of Birth: August 28, 1935 Referring Provider:    Encounter Date: 04/20/2016  Check In:     Session Check In - 04/20/16 1129    Check-In   Location [facility]   Staff Present Diane Angelina Pih, MS, EP, [facility], Exercise Physiologist;Lolah Coghlan Oletta Lamas, RN, BSN   Supervising physician immediately available to respond to emergencies See telemetry face sheet for immediately available MD   Medication changes reported     No   Fall or balance concerns reported    No   Warm-up and Cool-down Performed as group-led instruction   Resistance Training Performed Yes   VAD Patient? No   Pain Assessment   Currently in Pain? No/denies      Capillary Blood Glucose: No results found for this or any previous visit (from the past 24 hour(s)).   Goals Met:  Proper associated with RPD/PD & O2 Sat Independence with exercise equipment Exercise tolerated well No report of cardiac concerns or symptoms Strength training completed today  Goals Unmet:  BP-hypertension  Comments: check out 1200   Dr. Kate Sable is Medical Director for [facility] and Pulmonary Rehab.

## 2016-04-22 ENCOUNTER — Encounter (HOSPITAL_COMMUNITY)
Admission: RE | Admit: 2016-04-22 | Discharge: 2016-04-22 | Disposition: A | Discharge status: Home / Self Care | Payer: Medicare Other | Source: Ambulatory Visit | Attending: Cardiovascular Disease | Admitting: Cardiovascular Disease

## 2016-04-22 VITALS — Ht 68.5 in | Wt 238.1 lb

## 2016-04-22 DIAGNOSIS — Z955 Presence of coronary angioplasty implant and graft: Secondary | ICD-10-CM | POA: Diagnosis not present

## 2016-04-22 DIAGNOSIS — I2119 ST elevation (STEMI) myocardial infarction involving other coronary artery of inferior wall: Secondary | ICD-10-CM | POA: Insufficient documentation

## 2016-04-22 NOTE — Progress Notes (Signed)
Daily Session Note  Patient Details  Name: DIGBY GROENEVELD MRN: [id number] Date of Birth: 08-Feb-1935 Referring Provider:    Encounter Date: 04/22/2016  Check In:     Session Check In - 04/22/16 1100    Check-In   Location [facility]   Staff Present Litisha Guagliardo Angelina Pih, MS, EP, [facility], Exercise Physiologist;Christy Oletta Lamas, RN, BSN   Supervising physician immediately available to respond to emergencies See telemetry face sheet for immediately available MD   Medication changes reported     No   Fall or balance concerns reported    No   Warm-up and Cool-down Performed as group-led instruction   Resistance Training Performed Yes   VAD Patient? No   Pain Assessment   Currently in Pain? No/denies   Pain Score 0-No pain   Multiple Pain Sites No      Capillary Blood Glucose: No results found for this or any previous visit (from the past 24 hour(s)).   Goals Met:  Independence with exercise equipment Exercise tolerated well No report of cardiac concerns or symptoms Strength training completed today  Goals Unmet:  Not Applicable  Comments: Check out: 12noon   Dr. Kate Sable is Medical Director for [place] and Pulmonary Rehab.

## 2016-04-25 ENCOUNTER — Encounter (HOSPITAL_COMMUNITY): Payer: Medicare Other

## 2016-05-02 ENCOUNTER — Ambulatory Visit: Payer: Medicare Other | Admitting: Cardiology

## 2016-05-09 ENCOUNTER — Ambulatory Visit (INDEPENDENT_AMBULATORY_CARE_PROVIDER_SITE_OTHER): Payer: Medicare Other | Admitting: Cardiology

## 2016-05-09 ENCOUNTER — Ambulatory Visit: Payer: Medicare Other | Admitting: Cardiology

## 2016-05-09 ENCOUNTER — Encounter: Payer: Self-pay | Admitting: Cardiology

## 2016-05-09 VITALS — BP 136/72 | HR 83 | Ht 68.0 in | Wt 237.0 lb

## 2016-05-09 DIAGNOSIS — I2510 Atherosclerotic heart disease of native coronary artery without angina pectoris: Secondary | ICD-10-CM

## 2016-05-09 DIAGNOSIS — I10 Essential (primary) hypertension: Secondary | ICD-10-CM

## 2016-05-09 DIAGNOSIS — E785 Hyperlipidemia, unspecified: Secondary | ICD-10-CM | POA: Diagnosis not present

## 2016-05-09 MED ORDER — METOPROLOL SUCCINATE ER 50 MG PO TB24: 50.0000 mg | ORAL_TABLET | Freq: Every day | ORAL | Status: DC

## 2016-05-09 NOTE — Progress Notes (Signed)
Clinical Summary Darrell Marshall is a 81 y.o.male last seen by PA Kilroy, this is our first visit together.  1. CAD - admit 12/2015 with inferior STEMI, received DES to RCA. Reperfusion complicated by VT, received DCCV.  - echo 12/2015 LVEF 40-45%.  - no recent chest pain. No SOB or DOE - compliant with meds. There appears to be a mix up with pharmacy labeling, he has a bottle with atorvastatin printed on the bottle itself but the printed out attached stick says Toprol XL.   2. HTN - does not check regularly at home - compliant with meds. Atypical regimen in that he is on both norvasc and diltiazem, from discharge summary appears he was to stop diltiazem.    3. Hyperlipidemia - compliant with statin     Past Medical History  Diagnosis Date  . Diabetes mellitus without complication ([place])   . Hypertension   . ST elevation myocardial infarction (STEMI) of inferior wall ([place]) 01/12/2016  . Ischemic cardiomyopathy     Echo 01/13/2016 EF 40-45%  . CAD (coronary artery disease), native coronary artery     cath 01/12/2016 50% ost D1, 60% prox LCx, 100% distal RCA lesion treated with DES  . CKD (chronic kidney disease), stage III 01/15/2016     No Known Allergies   Current Outpatient Prescriptions  Medication Sig Dispense Refill  . AIMSCO INSULIN SYR ULTRA THIN 31G X 5/16" 0.3 ML MISC     . amLODipine (NORVASC) 10 MG tablet Take 1 tablet (10 mg total) by mouth daily. 90 tablet 3  . aspirin 81 MG chewable tablet Chew 1 tablet (81 mg total) by mouth daily.    [name] Kitchen atorvastatin (LIPITOR) 80 MG tablet Take 1 tablet (80 mg total) by mouth daily at 6 PM. 90 tablet 3  . diltiazem (CARDIZEM CD) 180 MG 24 hr capsule Take 180 mg by mouth daily.    . famotidine (PEPCID) 40 MG tablet Take 40 mg by mouth daily.    [name] Kitchen losartan (COZAAR) 50 MG tablet Take 1 tablet (50 mg total) by mouth daily. 90 tablet 3  . metFORMIN (GLUCOPHAGE) 500 MG tablet Take 500 mg by mouth 2 (two) times daily.    . metoprolol  tartrate (LOPRESSOR) 25 MG tablet Take 1 tablet (25 mg total) by mouth 2 (two) times daily. 180 tablet 3  . nitroGLYCERIN (NITROSTAT) 0.4 MG SL tablet Place 1 tablet (0.4 mg total) under the tongue every 5 (five) minutes x 3 doses as needed for chest pain. 25 tablet 3  . ONE TOUCH ULTRA TEST test strip     . ticagrelor (BRILINTA) 90 MG TABS tablet Take 1 tablet (90 mg total) by mouth 2 (two) times daily. 180 tablet 3  . vitamin C (ASCORBIC ACID) 500 MG tablet Take 1,000 mg by mouth daily.    [name] Kitchen zinc gluconate 50 MG tablet Take 50 mg by mouth daily.     No current facility-administered medications for this visit.     Past Surgical History  Procedure Laterality Date  . Appendectomy    . Cardiac catheterization N/A 01/12/2016    Procedure: Left Heart Cath and Coronary Angiography;  Surgeon: Sherren Mocha, MD;  Location: [facility];  Service: Cardiovascular;  Laterality: N/A;  . Cardiac catheterization N/A 01/12/2016    Procedure: Coronary Stent Intervention;  Surgeon: Sherren Mocha, MD;  Location: [facility];  Service: Cardiovascular;  Laterality: N/A;  . Coronary stent placement  01/12/16  No Known Allergies    Family History  Problem Relation Age of Onset  . Prostate cancer Father   . Ovarian cancer Mother   . Diabetes Mother   . Heart attack Neg Hx   . Stroke Neg Hx   . Hypertension Mother      Social History Mr. Osorto reports that he has never smoked. He does not have any smokeless tobacco history on file. Mr. Hartigan reports that he does not drink alcohol.   Review of Systems CONSTITUTIONAL: No weight loss, fever, chills, weakness or fatigue.  HEENT: Eyes: No visual loss, blurred vision, double vision or yellow sclerae.No hearing loss, sneezing, congestion, runny nose or sore throat.  SKIN: No rash or itching.  CARDIOVASCULAR: per HPI RESPIRATORY: No shortness of breath, cough or sputum.  GASTROINTESTINAL: No anorexia, nausea, vomiting or diarrhea.  No abdominal pain or blood.  GENITOURINARY: No burning on urination, no polyuria NEUROLOGICAL: No headache, dizziness, syncope, paralysis, ataxia, numbness or tingling in the extremities. No change in bowel or bladder control.  MUSCULOSKELETAL: No muscle, back pain, joint pain or stiffness.  LYMPHATICS: No enlarged nodes. No history of splenectomy.  PSYCHIATRIC: No history of depression or anxiety.  ENDOCRINOLOGIC: No reports of sweating, cold or heat intolerance. No polyuria or polydipsia.  [name] Kitchen   Physical Examination Filed Vitals:   05/09/16 1559  BP: 136/72  Pulse: 83   Filed Weights   05/09/16 1559  Weight: 237 lb (107.502 kg)    Gen: resting comfortably, no acute distress HEENT: no scleral icterus, pupils equal round and reactive, no palptable cervical adenopathy,  CV: RRR, no m/r/g, no jvd Resp: Clear to auscultation bilaterally GI: abdomen is soft, non-tender, non-distended, normal bowel sounds, no hepatosplenomegaly MSK: extremities are warm, no edema.  Skin: warm, no rash Neuro:  no focal deficits Psych: appropriate affect    Assessment and Plan  1. CAD - no recent symptoms - we will continue current meds - continue DAPT at least until 12/2016  2. HTN - stop dilt since he is on norvasc. Send new Rx to pharmacy, he should be on Toprol XL 50mg  once daily.  - he will have a nursing bp check in 2 weeks.   3. Hyperlipidemia - request most recent labs from pcp - continue high dose statin. He will contact the pharmacy to sort out the medication mislabeling.    F/u 6 months   Arnoldo Lenis, M.D.

## 2016-05-09 NOTE — Patient Instructions (Signed)
Medication Instructions:  STOP DILTIAZEM STOP METOPROLOL 25 MG  START METOPROLOL XL 50 MG ONCE DAILY    Labwork: I WILL REQUEST YOUR LAB WORK FROM PCP.   Testing/Procedures: NONE  Follow-Up: Your physician recommends that you schedule a follow-up appointment in: 2 WEEKS FOR A BP CHECK Your physician wants you to follow-up in: [place] DR. BRANCH.  You will receive a reminder letter in the mail two months in advance. If you don't receive a letter, please call our office to schedule the follow-up appointment.    Any Other Special Instructions Will Be Listed Below (If Applicable).     If you need a refill on your cardiac medications before your next appointment, please call your pharmacy.

## 2016-05-20 NOTE — Addendum Note (Signed)
Encounter addended by: Suzanne Boron on: 05/20/2016  3:27 PM<BR>     Documentation filed: Inpatient Document Flowsheet, Flowsheet VN

## 2016-05-23 ENCOUNTER — Ambulatory Visit (INDEPENDENT_AMBULATORY_CARE_PROVIDER_SITE_OTHER): Payer: Medicare Other

## 2016-05-23 DIAGNOSIS — I10 Essential (primary) hypertension: Secondary | ICD-10-CM | POA: Diagnosis not present

## 2016-05-23 NOTE — Patient Instructions (Addendum)
Pt blood pressure was 130/68.   Pt states that he has been taking his medication daily and is not having any problems with it.   Medication Instructions:  Your physician recommends that you continue on your current medications as directed. Please refer to the Current Medication list given to you today.    Labwork: NONE  Testing/Procedures: NONE  Follow-Up: Your physician wants you to follow-up in: 6 MONTHS . You will receive a reminder letter in the mail two months in advance. If you don't receive a letter, please call our office to schedule the follow-up appointment.   Any Other Special Instructions Will Be Listed Below (If Applicable).     If you need a refill on your cardiac medications before your next appointment, please call your pharmacy.

## 2016-05-23 NOTE — Progress Notes (Signed)
PT NOT HAVING ANY PROBLEMS. BP WAS 130/68

## 2016-06-16 NOTE — Progress Notes (Signed)
Discharge Summary  Patient Details  Name: Darrell Marshall MRN: [id number] Date of Birth: 1935-08-25 Referring Provider:     Number of Visits: 36  Reason for Discharge:  Patient reached a stable level of exercise. Patient independent in their exercise.  Smoking History:  History  Smoking Status  . Never Smoker  Smokeless Tobacco  . Not on file    Diagnosis:  ST elevation (STEMI) myocardial infarction involving other coronary artery of inferior wall (HCC)  Stented coronary artery  ADL UCSD:   Initial Exercise Prescription:     Initial Exercise Prescription - 01/26/16 1600      Date of Initial Exercise RX and Referring Provider   Date 01/26/16     NuStep   Level 2   Minutes 15   METs 1.5     Arm Ergometer   Level 1.5   Minutes 15   METs 1.5     Prescription Details   Frequency (times per week) 3     Intensity   THRR REST +  30   THRR 40-80% of Max Heartrate 103-128   Ratings of Perceived Exertion 11-13     Progression   Progression Continue to progress workloads to maintain intensity without signs/symptoms of physical distress.     Resistance Training   Training Prescription Yes   Weight 1.0   Reps 10-12      Discharge Exercise Prescription (Final Exercise Prescription Changes):     Exercise Prescription Changes - 05/20/16 1500      Exercise Review   Progression Yes     Response to Exercise   Blood Pressure (Admit) 168/60   Blood Pressure (Exercise) 168/70   Blood Pressure (Exit) 138/60   Heart Rate (Admit) 87 bpm   Heart Rate (Exercise) 106 bpm   Heart Rate (Exit) 96 bpm   Rating of Perceived Exertion (Exercise) 13   Symptoms No   Duration Progress to 30 minutes of continuous aerobic without signs/symptoms of physical distress   Intensity Rest + 30     Progression   Progression Continue to progress workloads to maintain intensity without signs/symptoms of physical distress.     Resistance Training   Training Prescription Yes   Weight 3.0   Reps 10-12     Interval Training   Interval Training No     NuStep   Level 2   Minutes 20   METs 3.71     Arm Ergometer   Level 1.9   Minutes 15   METs 2.9     Home Exercise Plan   Plans to continue exercise at Home   Frequency Add 2 additional days to program exercise sessions.      Functional Capacity:     6 Minute Walk    Row Name 01/26/16 1500 05/20/16 1517       6 Minute Walk   Phase Initial Discharge    Distance 950 feet 1200 feet    Distance % Change  - 26.32 %    Walk Time 6 minutes 6 minutes    # of Rest Breaks 0 0    MPH 1.8 2.27    METS 2.38 2.74    RPE 13 13    Perceived Dyspnea  13 15    VO2 Peak 5.47 7.03    Symptoms No No    Resting HR 78 bpm 77 bpm    Resting BP 130/70 148/70    Max Ex. HR 100 bpm 90 bpm    Max Ex.  BP 170/88 184/74    2 Minute Post BP 148/78 152/70      Pre/Post BP   Baseline BP 130/70 148/70    6 Minute BP 170/88 184/74    Pre/Post BP? Yes Yes       Psychological, QOL, Others - Outcomes: PHQ 2/9: Depression screen [facility] 2/9 06/16/2016 01/26/2016  Decreased Interest 0 0  Down, Depressed, Hopeless 0 1  PHQ - 2 Score 0 1  Altered sleeping 0 0  Tired, decreased energy 0 0  Change in appetite 0 0  Feeling bad or failure about yourself  0 0  Trouble concentrating 0 0  Moving slowly or fidgety/restless 0 0  Suicidal thoughts 0 0  PHQ-9 Score 0 1  Difficult doing work/chores Not difficult at all Not difficult at all    Quality of Life:     Quality of Life - 05/20/16 1525      Quality of Life Scores   Health/Function Pre 25.6 %   Health/Function Post 29.6 %   Health/Function % Change 15.62 %   Socioeconomic Pre 30 %   Socioeconomic Post 29 %   Socioeconomic % Change  -3.33 %   Psych/Spiritual Pre 30 %   Psych/Spiritual Post 29.14 %   Psych/Spiritual % Change -2.87 %   Family Pre 28 %   Family Post 27.6 %   Family % Change -1.43 %   GLOBAL Pre 27.82 %   GLOBAL Post 29.09 %   GLOBAL % Change 4.57  %      Personal Goals: Goals established at orientation with interventions provided to work toward goal.     Personal Goals and Risk Factors at Admission - 01/26/16 1621      Core Components/Risk Factors/Patient Goals on Admission    Weight Management Obesity   Sedentary --  Walks at home   Tobacco Cessation --  Quit 01/11/1984   Diabetes Yes   Intervention Provide education about signs/symptoms and action to take for hypo/hyperglycemia.;Provide education about proper nutrition, including hydration, and aerobic/resistive exercise prescription along with prescribed medications to achieve blood glucose in normal ranges: Fasting glucose 65-99 mg/dL   Expected Outcomes Long Term: Attainment of HbA1C < 7%.;Short Term: Participant verbalizes understanding of the signs/symptoms and immediate care of hyper/hypoglycemia, proper foot care and importance of medication, aerobic/resistive exercise and nutrition plan for blood glucose control.   Lipids Yes   Intervention Provide education and support for participant on nutrition & aerobic/resistive exercise along with prescribed medications to achieve LDL 70mg , HDL >40mg .   Expected Outcomes Long Term: Cholesterol controlled with medications as prescribed, with individualized exercise RX and with personalized nutrition plan. Value goals: LDL < 70mg , HDL > 40 mg.;Short Term: Participant states understanding of desired cholesterol values and is compliant with medications prescribed. Participant is following exercise prescription and nutrition guidelines.   Stress --  no   Personal Goal Other --  Breath better, Increas activity and lose weight       Personal Goals Discharge:     Goals and Risk Factor Review    Row Name 01/26/16 1623 02/15/16 1446 03/18/16 1427 04/11/16 1510 06/16/16 1452     Core Components/Risk Factors/Patient Goals Review   Personal Goals Review Weight Management/Obesity Weight Management/Obesity;Lipids;Diabetes Weight  Management/Obesity;Lipids;Diabetes Weight Management/Obesity;Lipids;Diabetes Weight Management/Obesity;Increase Strength and Stamina   Review  - Will continue to education patient on these topics.  Patient is maintaining weight at 244 lbs Will continue to education patient on these topics.  Patient has lost  4 lbs since starting program Will continue to education patient on these topics.  Patient weight is maintaining at this time. BS has been WNL.  Upon graduation, patient did lose 6.3 lbs during program but gained 4 lbs with an overall weight loss of 1.3 lbs. His strength and stamina did increase and he was able to breathe better.    Expected Outcomes  -  - lose weight. BS and lipids WNL lose weight. BS and lipids WNL Patient will continue to lose weight and increase his strength and stamina with continued exercising.       Nutrition & Weight - Outcomes:     Pre Biometrics - 01/26/16 1615      Pre Biometrics   Height 5' 8.5" (1.74 m)   Weight 244 lb 4.8 oz (110.8 kg)   Waist Circumference 49 inches   Hip Circumference 45 inches   Waist to Hip Ratio 1.09 %   BMI (Calculated) 36.7   Triceps Skinfold 18 mm   % Body Fat 36.2 %   Grip Strength 53 kg   Flexibility 0 in   Single Leg Stand 22 seconds         Post Biometrics - 05/20/16 1520       Post  Biometrics   Height 5' 8.5" (1.74 m)   Weight 238 lb 1.6 oz (108 kg)   Waist Circumference 51 inches   Hip Circumference 46 inches   Waist to Hip Ratio 1.11 %   BMI (Calculated) 35.8   Triceps Skinfold 17 mm   % Body Fat 36.6 %   Grip Strength 79.67 kg   Flexibility 0 in   Single Leg Stand 5 seconds      Nutrition:     Nutrition Therapy & Goals - 01/26/16 1620      Intervention Plan   Intervention Nutrition handout(s) given to patient.      Nutrition Discharge:     Nutrition Assessments - 06/16/16 1451      MEDFICTS Scores   Pre Score 31   Post Score 37   Score Difference 6      Education Questionnaire Score:      Knowledge Questionnaire Score - 06/16/16 1451      Knowledge Questionnaire Score   Pre Score 19/24   Post Score 22/24      Goals reviewed with patient; copy given to patient.

## 2016-06-16 NOTE — Addendum Note (Signed)
Encounter addended by: Dwana Melena, RN on: 06/16/2016  3:12 PM<BR>    Actions taken: Visit diagnoses modified, Visit Navigator Flowsheet section accepted, Sign clinical note, Episode resolved

## 2016-06-27 ENCOUNTER — Other Ambulatory Visit: Payer: Self-pay | Admitting: Pharmacist

## 2016-06-27 NOTE — Patient Outreach (Signed)
Outreach call to [employer] regarding his request for follow up from the [employer] Medication Adherence Campaign. Called and spoke with patient. HIPAA identifiers verified and verbal consent received.  Patient reports that he is taking both his atorvastatin 80 mg daily and metformin 500 mg twice daily as directed. Denies any difficulty with affordability, missed doses, or any questions or concerns at this time. Provide patient with my phone number for future questions.  Harlow Asa, PharmD Clinical Pharmacist [facility] [phone]

## 2016-07-20 DIAGNOSIS — Z23 Encounter for immunization: Secondary | ICD-10-CM | POA: Diagnosis not present

## 2016-07-20 DIAGNOSIS — I10 Essential (primary) hypertension: Secondary | ICD-10-CM | POA: Diagnosis not present

## 2016-07-20 DIAGNOSIS — I25119 Atherosclerotic heart disease of native coronary artery with unspecified angina pectoris: Secondary | ICD-10-CM | POA: Diagnosis not present

## 2016-07-20 DIAGNOSIS — E1121 Type 2 diabetes mellitus with diabetic nephropathy: Secondary | ICD-10-CM | POA: Diagnosis not present

## 2016-07-20 DIAGNOSIS — I129 Hypertensive chronic kidney disease with stage 1 through stage 4 chronic kidney disease, or unspecified chronic kidney disease: Secondary | ICD-10-CM | POA: Diagnosis not present

## 2016-07-21 ENCOUNTER — Encounter (HOSPITAL_COMMUNITY): Payer: Self-pay

## 2016-07-21 DIAGNOSIS — I10 Essential (primary) hypertension: Secondary | ICD-10-CM | POA: Diagnosis not present

## 2016-07-21 DIAGNOSIS — I129 Hypertensive chronic kidney disease with stage 1 through stage 4 chronic kidney disease, or unspecified chronic kidney disease: Secondary | ICD-10-CM | POA: Diagnosis not present

## 2016-07-21 DIAGNOSIS — I25119 Atherosclerotic heart disease of native coronary artery with unspecified angina pectoris: Secondary | ICD-10-CM | POA: Diagnosis not present

## 2016-07-21 DIAGNOSIS — E1121 Type 2 diabetes mellitus with diabetic nephropathy: Secondary | ICD-10-CM | POA: Diagnosis not present

## 2016-07-29 DIAGNOSIS — M5137 Other intervertebral disc degeneration, lumbosacral region: Secondary | ICD-10-CM | POA: Diagnosis not present

## 2016-07-29 DIAGNOSIS — M1711 Unilateral primary osteoarthritis, right knee: Secondary | ICD-10-CM | POA: Diagnosis not present

## 2016-11-22 DIAGNOSIS — I10 Essential (primary) hypertension: Secondary | ICD-10-CM | POA: Diagnosis not present

## 2016-11-22 DIAGNOSIS — E1121 Type 2 diabetes mellitus with diabetic nephropathy: Secondary | ICD-10-CM | POA: Diagnosis not present

## 2016-11-22 DIAGNOSIS — I25119 Atherosclerotic heart disease of native coronary artery with unspecified angina pectoris: Secondary | ICD-10-CM | POA: Diagnosis not present

## 2016-11-22 DIAGNOSIS — N183 Chronic kidney disease, stage 3 (moderate): Secondary | ICD-10-CM | POA: Diagnosis not present

## 2016-11-24 ENCOUNTER — Ambulatory Visit (INDEPENDENT_AMBULATORY_CARE_PROVIDER_SITE_OTHER): Payer: Medicare Other | Admitting: Cardiology

## 2016-11-24 ENCOUNTER — Encounter: Payer: Self-pay | Admitting: Cardiology

## 2016-11-24 VITALS — BP 150/82 | HR 99 | Ht 68.0 in | Wt 239.0 lb

## 2016-11-24 DIAGNOSIS — E782 Mixed hyperlipidemia: Secondary | ICD-10-CM

## 2016-11-24 DIAGNOSIS — I10 Essential (primary) hypertension: Secondary | ICD-10-CM

## 2016-11-24 DIAGNOSIS — I2510 Atherosclerotic heart disease of native coronary artery without angina pectoris: Secondary | ICD-10-CM | POA: Diagnosis not present

## 2016-11-24 DIAGNOSIS — I5022 Chronic systolic (congestive) heart failure: Secondary | ICD-10-CM

## 2016-11-24 NOTE — Progress Notes (Signed)
Clinical Summary Mr. Couser is a 82 y.o.male seen today for follow up of the following medical problems.   1. CAD - admit 12/2015 with inferior STEMI, received DES to RCA. Reperfusion complicated by VT, received DCCV.  - echo 12/2015 LVEF 40-45%.   - denies any chest pain. No SOB or DOE - compliant with meds  2. HTN - does not check blood pressure regularly at home - has not taken meds yet today.    3. Hyperlipidemia - compliant with statin   Past Medical History:  Diagnosis Date  . CAD (coronary artery disease), native coronary artery    cath 01/12/2016 50% ost D1, 60% prox LCx, 100% distal RCA lesion treated with DES  . CKD (chronic kidney disease), stage III 01/15/2016  . Diabetes mellitus without complication ([place])   . Hypertension   . Ischemic cardiomyopathy    Echo 01/13/2016 EF 40-45%  . ST elevation myocardial infarction (STEMI) of inferior wall ([place]) 01/12/2016     No Known Allergies   Current Outpatient Prescriptions  Medication Sig Dispense Refill  . AIMSCO INSULIN SYR ULTRA THIN 31G X 5/16" 0.3 ML MISC     . amLODipine (NORVASC) 10 MG tablet Take 1 tablet (10 mg total) by mouth daily. 90 tablet 3  . aspirin 81 MG chewable tablet Chew 1 tablet (81 mg total) by mouth daily.    [name] Kitchen atorvastatin (LIPITOR) 80 MG tablet Take 1 tablet (80 mg total) by mouth daily at 6 PM. 90 tablet 3  . famotidine (PEPCID) 40 MG tablet Take 40 mg by mouth daily.    [name] Kitchen losartan (COZAAR) 50 MG tablet Take 1 tablet (50 mg total) by mouth daily. 90 tablet 3  . metFORMIN (GLUCOPHAGE) 500 MG tablet Take 500 mg by mouth 2 (two) times daily.    . metoprolol succinate (TOPROL XL) 50 MG 24 hr tablet Take 1 tablet (50 mg total) by mouth daily. Take with or immediately following a meal. 90 tablet 3  . nitroGLYCERIN (NITROSTAT) 0.4 MG SL tablet Place 1 tablet (0.4 mg total) under the tongue every 5 (five) minutes x 3 doses as needed for chest pain. 25 tablet 3  . ONE TOUCH ULTRA TEST test strip      . ticagrelor (BRILINTA) 90 MG TABS tablet Take 1 tablet (90 mg total) by mouth 2 (two) times daily. 180 tablet 3  . vitamin C (ASCORBIC ACID) 500 MG tablet Take 1,000 mg by mouth daily.    [name] Kitchen zinc gluconate 50 MG tablet Take 50 mg by mouth daily.     No current facility-administered medications for this visit.      Past Surgical History:  Procedure Laterality Date  . APPENDECTOMY    . CARDIAC CATHETERIZATION N/A 01/12/2016   Procedure: Left Heart Cath and Coronary Angiography;  Surgeon: Sherren Mocha, MD;  Location: [facility];  Service: Cardiovascular;  Laterality: N/A;  . CARDIAC CATHETERIZATION N/A 01/12/2016   Procedure: Coronary Stent Intervention;  Surgeon: Sherren Mocha, MD;  Location: [facility];  Service: Cardiovascular;  Laterality: N/A;  . CORONARY STENT PLACEMENT  01/12/16     No Known Allergies    Family History  Problem Relation Age of Onset  . Ovarian cancer Mother   . Diabetes Mother   . Hypertension Mother   . Prostate cancer Father   . Heart attack Neg Hx   . Stroke Neg Hx      Social History Mr. Plascencia reports that he  has never smoked. He does not have any smokeless tobacco history on file. Mr. Matuszak reports that he does not drink alcohol.   Review of Systems CONSTITUTIONAL: No weight loss, fever, chills, weakness or fatigue.  HEENT: Eyes: No visual loss, blurred vision, double vision or yellow sclerae.No hearing loss, sneezing, congestion, runny nose or sore throat.  SKIN: No rash or itching.  CARDIOVASCULAR: per hpi RESPIRATORY: No shortness of breath, cough or sputum.  GASTROINTESTINAL: No anorexia, nausea, vomiting or diarrhea. No abdominal pain or blood.  GENITOURINARY: No burning on urination, no polyuria NEUROLOGICAL: No headache, dizziness, syncope, paralysis, ataxia, numbness or tingling in the extremities. No change in bowel or bladder control.  MUSCULOSKELETAL: No muscle, back pain, joint pain or stiffness.  LYMPHATICS:  No enlarged nodes. No history of splenectomy.  PSYCHIATRIC: No history of depression or anxiety.  ENDOCRINOLOGIC: No reports of sweating, cold or heat intolerance. No polyuria or polydipsia.  [name] Kitchen   Physical Examination Vitals:   11/24/16 0817  BP: (!) 150/82  Pulse: 99   Filed Weights   11/24/16 0817  Weight: 239 lb (108.4 kg)    Gen: resting comfortably, no acute distress HEENT: no scleral icterus, pupils equal round and reactive, no palptable cervical adenopathy,  CV: RRR, no m/r/,g no jvd Resp: Clear to auscultation bilaterally GI: abdomen is soft, non-tender, non-distended, normal bowel sounds, no hepatosplenomegaly MSK: extremities are warm, no edema.  Skin: warm, no rash Neuro:  no focal deficits Psych: appropriate affect     Assessment and Plan   1. CAD/Chronic systolic HF - no recent symptoms  - continue DAPT at least until 05/2256 - mild LV systolic dysfunction on prior echo at time of MI, repeat echo. If persistent dysfunction will need further medicaiton titration.     2.HTN - elevated in clinic however has not taken meds yet today. Previously well controlled at prior visits - continue current meds  3. Hyperlipidemia - request labs from pcp - continue high dose statin in setting of CAD   F/u 6 months     Arnoldo Lenis, M.D., F.A.C.C.

## 2016-11-24 NOTE — Patient Instructions (Signed)
Medication Instructions:  ON January 11, 2017- STOP BRILINTA  Labwork: I WILL REQUEST LABS FROM PCP  Testing/Procedures: Your physician has requested that you have an echocardiogram. Echocardiography is a painless test that uses sound waves to create images of your heart. It provides your doctor with information about the size and shape of your heart and how well your heart's chambers and valves are working. This procedure takes approximately one hour. There are no restrictions for this procedure.    Follow-Up: Your physician wants you to follow-up in: 6 MONTHS .  You will receive a reminder letter in the mail two months in advance. If you don't receive a letter, please call our office to schedule the follow-up appointment.   Any Other Special Instructions Will Be Listed Below (If Applicable).     If you need a refill on your cardiac medications before your next appointment, please call your pharmacy.

## 2016-11-25 ENCOUNTER — Ambulatory Visit (HOSPITAL_COMMUNITY)
Admission: RE | Admit: 2016-11-25 | Discharge: 2016-11-25 | Disposition: A | Discharge status: Home / Self Care | Payer: Medicare Other | Source: Ambulatory Visit | Attending: Cardiology | Admitting: Cardiology

## 2016-11-25 DIAGNOSIS — I5022 Chronic systolic (congestive) heart failure: Secondary | ICD-10-CM | POA: Diagnosis not present

## 2016-11-25 LAB — ECHOCARDIOGRAM COMPLETE
CHL CUP MV DEC (S): 320
E decel time: 320 msec
E/e' ratio: 10.09
FS: 23 % — AB (ref 28–44)
IV/PV OW: 1.26
LA diam end sys: 35 mm
LA vol index: 23.9 mL/m2
LA vol: 55.6 mL
LADIAMINDEX: 1.51 cm/m2
LASIZE: 35 mm
LAVOLA4C: 53.9 mL
LV E/e'average: 10.09
LV PW d: 12.5 mm — AB (ref 0.6–1.1)
LV dias vol index: 39 mL/m2
LVDIAVOL: 91 mL (ref 62–150)
LVEEMED: 10.09
LVELAT: 5.44 cm/s
LVOT VTI: 15.9 cm
LVOT area: 3.14 cm2
LVOT peak grad rest: 3 mmHg
LVOT peak vel: 84.8 cm/s
LVOTD: 20 mm
LVOTSV: 50 mL
Lateral S' vel: 14.7 cm/s
MV pk E vel: 54.9 m/s
MVPKAVEL: 105 m/s
TAPSE: 18.2 mm
TDI e' lateral: 5.44
TDI e' medial: 4.9

## 2016-11-25 NOTE — Progress Notes (Signed)
*  PRELIMINARY RESULTS* Echocardiogram 2D Echocardiogram has been performed.  Darrell Marshall 11/25/2016, 2:48 PM

## 2016-12-19 DIAGNOSIS — H18413 Arcus senilis, bilateral: Secondary | ICD-10-CM | POA: Diagnosis not present

## 2016-12-19 DIAGNOSIS — H43813 Vitreous degeneration, bilateral: Secondary | ICD-10-CM | POA: Diagnosis not present

## 2016-12-19 DIAGNOSIS — E119 Type 2 diabetes mellitus without complications: Secondary | ICD-10-CM | POA: Diagnosis not present

## 2016-12-19 DIAGNOSIS — H25813 Combined forms of age-related cataract, bilateral: Secondary | ICD-10-CM | POA: Diagnosis not present

## 2017-01-09 DIAGNOSIS — R21 Rash and other nonspecific skin eruption: Secondary | ICD-10-CM | POA: Diagnosis not present

## 2017-01-09 DIAGNOSIS — E119 Type 2 diabetes mellitus without complications: Secondary | ICD-10-CM | POA: Diagnosis not present

## 2017-01-09 DIAGNOSIS — I10 Essential (primary) hypertension: Secondary | ICD-10-CM | POA: Diagnosis not present

## 2017-01-18 DIAGNOSIS — L308 Other specified dermatitis: Secondary | ICD-10-CM | POA: Diagnosis not present

## 2017-01-18 DIAGNOSIS — I872 Venous insufficiency (chronic) (peripheral): Secondary | ICD-10-CM | POA: Diagnosis not present

## 2017-02-28 ENCOUNTER — Other Ambulatory Visit (HOSPITAL_COMMUNITY): Payer: Self-pay | Admitting: Pulmonary Disease

## 2017-02-28 ENCOUNTER — Ambulatory Visit (HOSPITAL_COMMUNITY)
Admission: RE | Admit: 2017-02-28 | Discharge: 2017-02-28 | Disposition: A | Discharge status: Home / Self Care | Payer: Medicare Other | Source: Ambulatory Visit | Attending: Pulmonary Disease | Admitting: Pulmonary Disease

## 2017-02-28 DIAGNOSIS — G5600 Carpal tunnel syndrome, unspecified upper limb: Secondary | ICD-10-CM | POA: Diagnosis not present

## 2017-02-28 DIAGNOSIS — I25119 Atherosclerotic heart disease of native coronary artery with unspecified angina pectoris: Secondary | ICD-10-CM | POA: Diagnosis not present

## 2017-02-28 DIAGNOSIS — I10 Essential (primary) hypertension: Secondary | ICD-10-CM | POA: Diagnosis not present

## 2017-02-28 DIAGNOSIS — E1121 Type 2 diabetes mellitus with diabetic nephropathy: Secondary | ICD-10-CM | POA: Diagnosis not present

## 2017-02-28 DIAGNOSIS — R531 Weakness: Secondary | ICD-10-CM

## 2017-02-28 LAB — VITAMIN B12: Vitamin B-12: 384

## 2017-03-02 ENCOUNTER — Other Ambulatory Visit (HOSPITAL_COMMUNITY): Payer: Self-pay | Admitting: Pulmonary Disease

## 2017-03-02 DIAGNOSIS — R200 Anesthesia of skin: Secondary | ICD-10-CM

## 2017-03-08 ENCOUNTER — Ambulatory Visit (HOSPITAL_COMMUNITY)
Admission: RE | Admit: 2017-03-08 | Discharge: 2017-03-08 | Disposition: A | Discharge status: Home / Self Care | Payer: Medicare Other | Source: Ambulatory Visit | Attending: Pulmonary Disease | Admitting: Pulmonary Disease

## 2017-03-08 DIAGNOSIS — R200 Anesthesia of skin: Secondary | ICD-10-CM | POA: Diagnosis not present

## 2017-03-08 DIAGNOSIS — M47812 Spondylosis without myelopathy or radiculopathy, cervical region: Secondary | ICD-10-CM | POA: Diagnosis not present

## 2017-03-08 DIAGNOSIS — M542 Cervicalgia: Secondary | ICD-10-CM | POA: Diagnosis not present

## 2017-04-05 ENCOUNTER — Other Ambulatory Visit: Payer: Self-pay | Admitting: Physician Assistant

## 2017-04-05 NOTE — Telephone Encounter (Signed)
Please review for refill. Thanks!  

## 2017-04-20 DIAGNOSIS — I129 Hypertensive chronic kidney disease with stage 1 through stage 4 chronic kidney disease, or unspecified chronic kidney disease: Secondary | ICD-10-CM | POA: Diagnosis not present

## 2017-04-20 DIAGNOSIS — G5603 Carpal tunnel syndrome, bilateral upper limbs: Secondary | ICD-10-CM | POA: Diagnosis not present

## 2017-04-20 DIAGNOSIS — E1121 Type 2 diabetes mellitus with diabetic nephropathy: Secondary | ICD-10-CM | POA: Diagnosis not present

## 2017-04-20 DIAGNOSIS — N183 Chronic kidney disease, stage 3 (moderate): Secondary | ICD-10-CM | POA: Diagnosis not present

## 2017-05-11 DIAGNOSIS — R600 Localized edema: Secondary | ICD-10-CM | POA: Diagnosis not present

## 2017-05-11 DIAGNOSIS — G5602 Carpal tunnel syndrome, left upper limb: Secondary | ICD-10-CM | POA: Diagnosis not present

## 2017-05-11 DIAGNOSIS — G5601 Carpal tunnel syndrome, right upper limb: Secondary | ICD-10-CM | POA: Diagnosis not present

## 2017-05-11 DIAGNOSIS — M79642 Pain in left hand: Secondary | ICD-10-CM | POA: Diagnosis not present

## 2017-05-11 DIAGNOSIS — E1140 Type 2 diabetes mellitus with diabetic neuropathy, unspecified: Secondary | ICD-10-CM | POA: Diagnosis not present

## 2017-05-25 ENCOUNTER — Ambulatory Visit (INDEPENDENT_AMBULATORY_CARE_PROVIDER_SITE_OTHER): Payer: Medicare Other | Admitting: Cardiology

## 2017-05-25 ENCOUNTER — Encounter (HOSPITAL_COMMUNITY): Payer: Self-pay

## 2017-05-25 ENCOUNTER — Emergency Department (HOSPITAL_COMMUNITY): Payer: Medicare Other

## 2017-05-25 ENCOUNTER — Emergency Department (HOSPITAL_COMMUNITY)
Admission: EM | Admit: 2017-05-25 | Discharge: 2017-05-25 | Disposition: A | Discharge status: Home / Self Care | Payer: Medicare Other | Attending: Emergency Medicine | Admitting: Emergency Medicine

## 2017-05-25 ENCOUNTER — Encounter: Payer: Self-pay | Admitting: Cardiology

## 2017-05-25 VITALS — BP 136/76 | HR 90 | Ht 68.0 in | Wt 227.0 lb

## 2017-05-25 DIAGNOSIS — N183 Chronic kidney disease, stage 3 (moderate): Secondary | ICD-10-CM | POA: Diagnosis not present

## 2017-05-25 DIAGNOSIS — M19041 Primary osteoarthritis, right hand: Secondary | ICD-10-CM | POA: Diagnosis not present

## 2017-05-25 DIAGNOSIS — R2233 Localized swelling, mass and lump, upper limb, bilateral: Secondary | ICD-10-CM | POA: Insufficient documentation

## 2017-05-25 DIAGNOSIS — Z7982 Long term (current) use of aspirin: Secondary | ICD-10-CM | POA: Insufficient documentation

## 2017-05-25 DIAGNOSIS — M79643 Pain in unspecified hand: Secondary | ICD-10-CM | POA: Diagnosis present

## 2017-05-25 DIAGNOSIS — Z79899 Other long term (current) drug therapy: Secondary | ICD-10-CM | POA: Diagnosis not present

## 2017-05-25 DIAGNOSIS — E119 Type 2 diabetes mellitus without complications: Secondary | ICD-10-CM | POA: Diagnosis not present

## 2017-05-25 DIAGNOSIS — I5022 Chronic systolic (congestive) heart failure: Secondary | ICD-10-CM

## 2017-05-25 DIAGNOSIS — Z7984 Long term (current) use of oral hypoglycemic drugs: Secondary | ICD-10-CM | POA: Insufficient documentation

## 2017-05-25 DIAGNOSIS — I2510 Atherosclerotic heart disease of native coronary artery without angina pectoris: Secondary | ICD-10-CM | POA: Insufficient documentation

## 2017-05-25 DIAGNOSIS — M7989 Other specified soft tissue disorders: Secondary | ICD-10-CM

## 2017-05-25 DIAGNOSIS — E782 Mixed hyperlipidemia: Secondary | ICD-10-CM | POA: Diagnosis not present

## 2017-05-25 DIAGNOSIS — I10 Essential (primary) hypertension: Secondary | ICD-10-CM

## 2017-05-25 DIAGNOSIS — I129 Hypertensive chronic kidney disease with stage 1 through stage 4 chronic kidney disease, or unspecified chronic kidney disease: Secondary | ICD-10-CM | POA: Insufficient documentation

## 2017-05-25 DIAGNOSIS — M19042 Primary osteoarthritis, left hand: Secondary | ICD-10-CM | POA: Diagnosis not present

## 2017-05-25 LAB — BASIC METABOLIC PANEL
ANION GAP: 10 (ref 5–15)
BUN: 18 mg/dL (ref 6–20)
CALCIUM: 9 mg/dL (ref 8.9–10.3)
CO2: 21 mmol/L — AB (ref 22–32)
Chloride: 102 mmol/L (ref 101–111)
Creatinine, Ser: 1.83 mg/dL — ABNORMAL HIGH (ref 0.61–1.24)
GFR calc Af Amer: 38 mL/min — ABNORMAL LOW (ref >60)
GFR calc non Af Amer: 33 mL/min — ABNORMAL LOW (ref >60)
GLUCOSE: 111 mg/dL — AB (ref 65–99)
Potassium: 4.1 mmol/L (ref 3.5–5.1)
Sodium: 133 mmol/L — ABNORMAL LOW (ref 135–145)

## 2017-05-25 LAB — CBC WITH DIFFERENTIAL/PLATELET
Basophils Absolute: 0 10*3/uL (ref 0.0–0.1)
Basophils Relative: 0 %
EOS PCT: 1 %
Eosinophils Absolute: 0.1 10*3/uL (ref 0.0–0.7)
HEMATOCRIT: 35.8 % — AB (ref 39.0–52.0)
Hemoglobin: 11.7 g/dL — ABNORMAL LOW (ref 13.0–17.0)
LYMPHS PCT: 11 %
Lymphs Abs: 0.9 10*3/uL (ref 0.7–4.0)
MCH: 21.5 pg — ABNORMAL LOW (ref 26.0–34.0)
MCHC: 32.7 g/dL (ref 30.0–36.0)
MCV: 65.7 fL — AB (ref 78.0–100.0)
MONO ABS: 1.2 10*3/uL — AB (ref 0.1–1.0)
Monocytes Relative: 16 %
Neutro Abs: 5.8 10*3/uL (ref 1.7–7.7)
Neutrophils Relative %: 73 %
Platelets: 393 10*3/uL (ref 150–400)
RBC: 5.45 MIL/uL (ref 4.22–5.81)
RDW: 15.9 % — AB (ref 11.5–15.5)
WBC: 8 10*3/uL (ref 4.0–10.5)

## 2017-05-25 LAB — URIC ACID: Uric Acid, Serum: 6.2 mg/dL (ref 4.4–7.6)

## 2017-05-25 MED ORDER — PREDNISONE 50 MG PO TABS
50.0000 mg | ORAL_TABLET | Freq: Once | ORAL | Status: AC
  Administered 2017-05-25: 50 mg via ORAL
  Filled 2017-05-25: qty 1

## 2017-05-25 MED ORDER — PREDNISONE 10 MG (21) PO TBPK: ORAL_TABLET | ORAL | 0 refills | Status: DC

## 2017-05-25 NOTE — ED Notes (Signed)
Pt c/o swelling to bilateral hands x 3 months. Left greater than right. Pt states he has been seen by his pcp and given steroids with temporary relief in the past. Pt reports he is having a hard time doing his ADL's due to swelling. Color and sensation intact. Cap refil < 3 seconds. Pt able to wiggle fingers but unable to make a fist due to swelling/pain. nad noted.

## 2017-05-25 NOTE — Patient Instructions (Signed)

## 2017-05-25 NOTE — Discharge Instructions (Signed)
Take the steroids to help with the swelling, follow up with Dr Luan Pulling as we discussed to consider seeing a rheumatologist.  Monitor for fever, worsening symptoms

## 2017-05-25 NOTE — ED Notes (Signed)
Patient returned from X-ray 

## 2017-05-25 NOTE — ED Notes (Signed)
Patient transported to X-ray 

## 2017-05-25 NOTE — Progress Notes (Signed)
Clinical Summary Darrell Marshall is a 82 y.o.male seen today for follow up of the following medical problems.   1. CAD - admit 12/2015 with inferior STEMI, received DES to RCA. Reperfusion complicated by VT, received DCCV.  - echo 12/2015 LVEF 40-45%.  - echo Jan 2018 LVEF 43-32%, grade I diastolic dysfunction  - no recent chest pain - compliant with meds.   2. HTN - does not check blood pressure regularly at home - compliant with meds   3. Hyperlipidemia - compliant with statin     Past Medical History:  Diagnosis Date  . CAD (coronary artery disease), native coronary artery    cath 01/12/2016 50% ost D1, 60% prox LCx, 100% distal RCA lesion treated with DES  . CKD (chronic kidney disease), stage III 01/15/2016  . Diabetes mellitus without complication ([place])   . Hypertension   . Ischemic cardiomyopathy    Echo 01/13/2016 EF 40-45%  . ST elevation myocardial infarction (STEMI) of inferior wall ([place]) 01/12/2016     No Known Allergies   Current Outpatient Prescriptions  Medication Sig Dispense Refill  . AIMSCO INSULIN SYR ULTRA THIN 31G X 5/16" 0.3 ML MISC     . amLODipine (NORVASC) 10 MG tablet Take 1 tablet (10 mg total) by mouth daily. 90 tablet 3  . aspirin 81 MG chewable tablet Chew 1 tablet (81 mg total) by mouth daily.    [name] Kitchen atorvastatin (LIPITOR) 80 MG tablet Take 1 tablet (80 mg total) by mouth daily at 6 PM. 90 tablet 3  . famotidine (PEPCID) 40 MG tablet Take 40 mg by mouth daily.    [name] Kitchen losartan (COZAAR) 50 MG tablet TAKE 1 TABLET (50 MG TOTAL) BY MOUTH DAILY. 90 tablet 3  . metFORMIN (GLUCOPHAGE) 500 MG tablet Take 500 mg by mouth 2 (two) times daily.    . metoprolol succinate (TOPROL XL) 50 MG 24 hr tablet Take 1 tablet (50 mg total) by mouth daily. Take with or immediately following a meal. 90 tablet 3  . nitroGLYCERIN (NITROSTAT) 0.4 MG SL tablet Place 1 tablet (0.4 mg total) under the tongue every 5 (five) minutes x 3 doses as needed for chest pain. 25  tablet 3  . ONE TOUCH ULTRA TEST test strip     . ticagrelor (BRILINTA) 90 MG TABS tablet Take 1 tablet (90 mg total) by mouth 2 (two) times daily. 180 tablet 3  . vitamin C (ASCORBIC ACID) 500 MG tablet Take 1,000 mg by mouth daily.    [name] Kitchen zinc gluconate 50 MG tablet Take 50 mg by mouth daily.     No current facility-administered medications for this visit.      Past Surgical History:  Procedure Laterality Date  . APPENDECTOMY    . CARDIAC CATHETERIZATION N/A 01/12/2016   Procedure: Left Heart Cath and Coronary Angiography;  Surgeon: Sherren Mocha, MD;  Location: [facility];  Service: Cardiovascular;  Laterality: N/A;  . CARDIAC CATHETERIZATION N/A 01/12/2016   Procedure: Coronary Stent Intervention;  Surgeon: Sherren Mocha, MD;  Location: [facility];  Service: Cardiovascular;  Laterality: N/A;  . CORONARY STENT PLACEMENT  01/12/16     No Known Allergies    Family History  Problem Relation Age of Onset  . Ovarian cancer Mother   . Diabetes Mother   . Hypertension Mother   . Prostate cancer Father   . Heart attack Neg Hx   . Stroke Neg Hx      Social History  Darrell Marshall reports that he has never smoked. He does not have any smokeless tobacco history on file. Darrell Marshall reports that he does not drink alcohol.   Review of Systems CONSTITUTIONAL: No weight loss, fever, chills, weakness or fatigue.  HEENT: Eyes: No visual loss, blurred vision, double vision or yellow sclerae.No hearing loss, sneezing, congestion, runny nose or sore throat.  SKIN: No rash or itching.  CARDIOVASCULAR: per hpi RESPIRATORY: No shortness of breath, cough or sputum.  GASTROINTESTINAL: No anorexia, nausea, vomiting or diarrhea. No abdominal pain or blood.  GENITOURINARY: No burning on urination, no polyuria NEUROLOGICAL: No headache, dizziness, syncope, paralysis, ataxia, numbness or tingling in the extremities. No change in bowel or bladder control.  MUSCULOSKELETAL: No muscle,  back pain, joint pain or stiffness.  LYMPHATICS: No enlarged nodes. No history of splenectomy.  PSYCHIATRIC: No history of depression or anxiety.  ENDOCRINOLOGIC: No reports of sweating, cold or heat intolerance. No polyuria or polydipsia.  [name] Kitchen   Physical Examination Vitals:   05/25/17 1118  BP: 136/76  Pulse: 90   Vitals:   05/25/17 1118  Weight: 227 lb (103 kg)  Height: 5\' 8"  (1.727 m)    Gen: resting comfortably, no acute distress HEENT: no scleral icterus, pupils equal round and reactive, no palptable cervical adenopathy,  CV: RRR, no m/r/g, no jvd Resp: Clear to auscultation bilaterally GI: abdomen is soft, non-tender, non-distended, normal bowel sounds, no hepatosplenomegaly MSK: extremities are warm, no edema.  Skin: warm, no rash Neuro:  no focal deficits Psych: appropriate affect   Diagnostic Studies Jan 2018  Study Conclusions  - Left ventricle: The cavity size was normal. Wall thickness was   increased in a pattern of moderate LVH. Systolic function was   normal. The estimated ejection fraction was in the range of 50%   to 55%. Doppler parameters are consistent with abnormal left   ventricular relaxation (grade 1 diastolic dysfunction). - Aortic valve: Mildly calcified annulus. Trileaflet; mildly   thickened leaflets. Valve area (VTI): 2.26 cm^2. Valve area   (Vmax): 2.1 cm^2. Valve area (Vmean): 2.01 cm^2. - Technically adequate study.    Assessment and Plan   1. CAD/Chronic systolic HF - no recent symptoms. EKG in clinic shows SR, no acute ischemic changes - mild LV systolic dysfunction has now normalized - continue current meds  2. HTN - at goal, continue current meds  3. Hyperlipidemia - continue statin   F/u 6 months     Arnoldo Lenis, M.D.

## 2017-05-25 NOTE — ED Triage Notes (Signed)
Pt reports that he has pain and swelling in bilateral hands. Pt says hands are very uncomfortable. This has been going on 2 months and went to see Dr Luan Pulling which helped, but then swelling returned. Also seen by neurologist

## 2017-05-25 NOTE — ED Provider Notes (Signed)
[facility] DEPT Provider Note   CSN: [id number] Arrival date & time: 05/25/17  1154     History   Chief Complaint Chief Complaint  Patient presents with  . Hand Pain    HPI Darrell Marshall is a 82 y.o. male.  HPI The patient presents to the emergency room for evaluation of bilateral hand pain and swelling. Patient states the symptoms have been going on for a couple of months. He saw his primary doctor. He ended up having cervical spine films and a cervical spine MRI. It sounds like the symptoms were felt to be radicular in nature. Patient has follow-up plans to have what sounds like EMG testing.  He has been given steroids in the past which seems to help. In the last several days however his head recurrent hand swelling. It's difficult for the patient to wiggle his fingers and make a fist because of the swelling and the discomfort. He denies any recent injuries. No fevers or chills. Past Medical History:  Diagnosis Date  . CAD (coronary artery disease), native coronary artery    cath 01/12/2016 50% ost D1, 60% prox LCx, 100% distal RCA lesion treated with DES  . CKD (chronic kidney disease), stage III 01/15/2016  . Diabetes mellitus without complication ([place])   . Hypertension   . Ischemic cardiomyopathy    Echo 01/13/2016 EF 40-45%  . ST elevation myocardial infarction (STEMI) of inferior wall ([place]) 01/12/2016    Patient Active Problem List   Diagnosis Date Noted  . Dyslipidemia 01/22/2016  . CKD (chronic kidney disease), stage III 01/15/2016  . Hypertension   . Diabetes mellitus with renal complications ([place])   . CAD S/P percutaneous coronary angioplasty   . Ischemic cardiomyopathy   . STEMI) of inferior wall 01/12/16 01/12/2016    Past Surgical History:  Procedure Laterality Date  . APPENDECTOMY    . CARDIAC CATHETERIZATION N/A 01/12/2016   Procedure: Left Heart Cath and Coronary Angiography;  Surgeon: Sherren Mocha, MD;  Location: [facility];  Service:  Cardiovascular;  Laterality: N/A;  . CARDIAC CATHETERIZATION N/A 01/12/2016   Procedure: Coronary Stent Intervention;  Surgeon: Sherren Mocha, MD;  Location: [facility];  Service: Cardiovascular;  Laterality: N/A;  . CORONARY STENT PLACEMENT  01/12/16       Home Medications    Prior to Admission medications   Medication Sig Start Date End Date Taking? Authorizing Provider  amLODipine (NORVASC) 10 MG tablet Take 1 tablet (10 mg total) by mouth daily. 01/16/16  Yes Almyra Deforest, PA  aspirin 81 MG chewable tablet Chew 1 tablet (81 mg total) by mouth daily. 01/15/16  Yes Almyra Deforest, PA  atorvastatin (LIPITOR) 80 MG tablet Take 1 tablet (80 mg total) by mouth daily at 6 PM. 01/15/16  Yes Almyra Deforest, PA  famotidine (PEPCID) 40 MG tablet Take 40 mg by mouth daily. 01/16/14  Yes [provider]  gabapentin (NEURONTIN) 100 MG capsule TAKE 1 CAPSULE AT BEDTIME X 3 NIGHTS MAY INCREASE BY 1 CAPSULE UP TO A MAX OF 3 CAPSULES AT BEDTIME 05/11/17  Yes [provider]  losartan (COZAAR) 50 MG tablet TAKE 1 TABLET (50 MG TOTAL) BY MOUTH DAILY. 04/05/17  Yes BranchAlphonse Guild, MD  metFORMIN (GLUCOPHAGE) 500 MG tablet Take 500 mg by mouth 2 (two) times daily. 02/21/14  Yes [provider]  metoprolol succinate (TOPROL XL) 50 MG 24 hr tablet Take 1 tablet (50 mg total) by mouth daily. Take with or immediately following  a meal. 05/09/16  Yes Branch, Alphonse Guild, MD  nitroGLYCERIN (NITROSTAT) 0.4 MG SL tablet Place 1 tablet (0.4 mg total) under the tongue every 5 (five) minutes x 3 doses as needed for chest pain. 01/15/16  Yes Almyra Deforest, PA  vitamin C (ASCORBIC ACID) 500 MG tablet Take 1,000 mg by mouth daily.   Yes [provider]  zinc gluconate 50 MG tablet Take 50 mg by mouth daily.   Yes [provider]  predniSONE (STERAPRED UNI-PAK 21 TAB) 10 MG (21) TBPK tablet Take 6 tabs by mouth daily  for 2 days, then 5 tabs for 2 days, then 4 tabs for 2 days, then 3 tabs for 2  days, 2 tabs for 2 days, then 1 tab by mouth daily for 2 days 05/25/17   Dorie Rank, MD    Family History Family History  Problem Relation Age of Onset  . Ovarian cancer Mother   . Diabetes Mother   . Hypertension Mother   . Prostate cancer Father   . Heart attack Neg Hx   . Stroke Neg Hx     Social History Social History  Substance Use Topics  . Smoking status: Never Smoker  . Smokeless tobacco: Never Used  . Alcohol use No     Allergies   Patient has no known allergies.   Review of Systems Review of Systems  All other systems reviewed and are negative.    Physical Exam Updated Vital Signs BP 126/80   Pulse 84   Temp 98.3 F (36.8 C) (Oral)   Resp 18   Ht 1.727 m (5\' 8" )   Wt 103 kg (227 lb)   SpO2 97%   BMI 34.52 kg/m   Physical Exam  Constitutional: He appears well-developed and well-nourished. No distress.  HENT:  Head: Normocephalic and atraumatic.  Right Ear: External ear normal.  Left Ear: External ear normal.  Eyes: Conjunctivae are normal. Right eye exhibits no discharge. Left eye exhibits no discharge. No scleral icterus.  Neck: Neck supple. No tracheal deviation present.  Cardiovascular: Normal rate, regular rhythm and normal heart sounds.   Pulmonary/Chest: Effort normal and breath sounds normal. No stridor. No respiratory distress. He has no wheezes.  Abdominal: He exhibits no distension.  Musculoskeletal: He exhibits no edema.       Right elbow: Normal.He exhibits no swelling.       Left elbow: Normal. He exhibits no swelling.       Right wrist: Normal.       Left wrist: Normal. He exhibits no swelling.       Right forearm: Normal. He exhibits no swelling.       Left forearm: Normal. He exhibits no swelling.       Right hand: He exhibits tenderness and swelling.       Left hand: He exhibits tenderness and swelling.  Neurological: He is alert. Cranial nerve deficit: no gross deficits.  Skin: Skin is warm and dry. No rash noted.    Psychiatric: He has a normal mood and affect.  Nursing note and vitals reviewed.    ED Treatments / Results  Labs (all labs ordered are listed, but only abnormal results are displayed) Labs Reviewed  CBC WITH DIFFERENTIAL/PLATELET - Abnormal; Notable for the following:       Result Value   Hemoglobin 11.7 (*)    HCT 35.8 (*)    MCV 65.7 (*)    MCH 21.5 (*)    RDW 15.9 (*)  Monocytes Absolute 1.2 (*)    All other components within normal limits  BASIC METABOLIC PANEL - Abnormal; Notable for the following:    Sodium 133 (*)    CO2 21 (*)    Glucose, Bld 111 (*)    Creatinine, Ser 1.83 (*)    GFR calc non Af Amer 33 (*)    GFR calc Af Amer 38 (*)    All other components within normal limits  URIC ACID    EKG  EKG Interpretation None       Radiology Dg Hand Complete Left  Result Date: 05/25/2017 CLINICAL DATA:  Lateral hand swelling, left greater than right. No known injury. EXAM: LEFT HAND - COMPLETE 3+ VIEW COMPARISON:  None. FINDINGS: Joint space narrowing and spurring in the IP joints and first MCP joint. Remainder the MCP joints are maintained. No bony erosions. No acute bony abnormality. Specifically, no fracture, subluxation, or dislocation. Soft tissues are intact. IMPRESSION: Mild osteoarthritic changes as above.  No acute findings. Electronically Signed   By: Rolm Baptise M.D.   On: 05/25/2017 12:56   Dg Hand Complete Right  Result Date: 05/25/2017 CLINICAL DATA:  Bilateral hand swelling, left greater than right EXAM: RIGHT HAND - COMPLETE 3+ VIEW COMPARISON:  None. FINDINGS: Mild joint space narrowing and spurring in the IP joints, most pronounced in the DIP joints as well as the first MCP joint. Remainder the MCP joints are unremarkable. No bony erosions. No acute bony abnormality. Specifically, no fracture, subluxation, or dislocation. Soft tissues are intact. IMPRESSION: Mild osteoarthritic changes as above.  No acute bony abnormality. Electronically Signed    By: Rolm Baptise M.D.   On: 05/25/2017 12:57    Procedures Procedures (including critical care time)  Medications Ordered in ED Medications  predniSONE (DELTASONE) tablet 50 mg (not administered)     Initial Impression / Assessment and Plan / ED Course  I have reviewed the triage vital signs and the nursing notes.  Pertinent labs & imaging results that were available during my care of the patient were reviewed by me and considered in my medical decision making (see chart for details).  Clinical Course as of May 25 1346  Thu May 25, 2017  1225  patient has been treated for cervical radiculopathy. However on exam today he does have focal swelling of his hands. I'm more concerned about possibly some type of inflammatory arthropathy causing his discomfort. He has normal pulses, I doubt acute vascular occlusion. He also has no swelling in his forearms or upper arms, I doubt venous occlusion.  [JK]    Clinical Course User Index [JK] Dorie Rank, MD   Patient presents to the emergency room with complaints of hand swelling. His laboratory tests are reassuring. X-rays only show mild degenerative joint changes. I doubt acute infection or vascular issue. I suspect some type of inflammatory arthropathy. In the past steroids have helped. I will have him take a steroid taper. Recommend he follow-up with his primary doctor to discuss seeing a rheumatologist for further evaluation   Final Clinical Impressions(s) / ED Diagnoses   Final diagnoses:  Bilateral hand swelling    New Prescriptions New Prescriptions   PREDNISONE (STERAPRED UNI-PAK 21 TAB) 10 MG (21) TBPK TABLET    Take 6 tabs by mouth daily  for 2 days, then 5 tabs for 2 days, then 4 tabs for 2 days, then 3 tabs for 2 days, 2 tabs for 2 days, then 1 tab by mouth daily for 2  days     Dorie Rank, MD 05/25/17 [phone]

## 2017-06-08 DIAGNOSIS — G5601 Carpal tunnel syndrome, right upper limb: Secondary | ICD-10-CM | POA: Diagnosis not present

## 2017-06-08 DIAGNOSIS — G5602 Carpal tunnel syndrome, left upper limb: Secondary | ICD-10-CM | POA: Diagnosis not present

## 2017-08-02 DIAGNOSIS — E1140 Type 2 diabetes mellitus with diabetic neuropathy, unspecified: Secondary | ICD-10-CM | POA: Diagnosis not present

## 2017-08-02 DIAGNOSIS — R600 Localized edema: Secondary | ICD-10-CM | POA: Diagnosis not present

## 2017-08-02 DIAGNOSIS — G5602 Carpal tunnel syndrome, left upper limb: Secondary | ICD-10-CM | POA: Diagnosis not present

## 2017-08-02 DIAGNOSIS — M79642 Pain in left hand: Secondary | ICD-10-CM | POA: Diagnosis not present

## 2017-08-02 DIAGNOSIS — G5601 Carpal tunnel syndrome, right upper limb: Secondary | ICD-10-CM | POA: Diagnosis not present

## 2017-08-14 ENCOUNTER — Other Ambulatory Visit: Payer: Self-pay

## 2017-08-14 MED ORDER — LOSARTAN POTASSIUM 50 MG PO TABS: 50.0000 mg | ORAL_TABLET | Freq: Every day | ORAL | 3 refills | Status: DC

## 2017-08-14 NOTE — Telephone Encounter (Signed)
Refilled losartan per fax request to [facility]

## 2017-09-05 DIAGNOSIS — M2550 Pain in unspecified joint: Secondary | ICD-10-CM | POA: Diagnosis not present

## 2017-09-05 DIAGNOSIS — R5383 Other fatigue: Secondary | ICD-10-CM | POA: Diagnosis not present

## 2017-10-17 DIAGNOSIS — N183 Chronic kidney disease, stage 3 (moderate): Secondary | ICD-10-CM | POA: Diagnosis not present

## 2017-10-17 DIAGNOSIS — M0609 Rheumatoid arthritis without rheumatoid factor, multiple sites: Secondary | ICD-10-CM | POA: Diagnosis not present

## 2017-11-22 ENCOUNTER — Encounter: Payer: Self-pay | Admitting: Cardiology

## 2017-11-22 ENCOUNTER — Ambulatory Visit: Payer: Medicare Other | Admitting: Cardiology

## 2017-11-22 NOTE — Progress Notes (Deleted)
Clinical Summary Mr. Darrell Darrell is a 83 y.o.male  seen today for follow up of the following medical problems.   1. CAD - admit 12/2015 with inferior STEMI, received DES to RCA. Reperfusion complicated by VT, received DCCV.  - echo 12/2015 LVEF 40-45%.  - echo Jan 2018 LVEF 74-94%, grade I diastolic dysfunction  - no recent chest pain - compliant with meds.   2. HTN - does not check blood pressure regularly at home - compliant with meds   3. Hyperlipidemia - compliant with statin     Past Medical History:  Diagnosis Date  . CAD (coronary artery disease), native coronary artery    cath 01/12/2016 50% ost D1, 60% prox LCx, 100% distal RCA lesion treated with DES  . CKD (chronic kidney disease), stage III 01/15/2016  . Diabetes mellitus without complication ([facility])   . Hypertension   . Ischemic cardiomyopathy    Echo 01/13/2016 EF 40-45%  . ST elevation myocardial infarction (STEMI) of inferior wall ([place]) 01/12/2016     No Known Allergies   Current Outpatient Medications  Medication Sig Dispense Refill  . amLODipine (NORVASC) 10 MG tablet Take 1 tablet (10 mg total) by mouth daily. 90 tablet 3  . aspirin 81 MG chewable tablet Chew 1 tablet (81 mg total) by mouth daily.    [name] Kitchen atorvastatin (LIPITOR) 80 MG tablet Take 1 tablet (80 mg total) by mouth daily at 6 PM. 90 tablet 3  . famotidine (PEPCID) 40 MG tablet Take 40 mg by mouth daily.    [name] Kitchen gabapentin (NEURONTIN) 100 MG capsule TAKE 1 CAPSULE AT BEDTIME X 3 NIGHTS MAY INCREASE BY 1 CAPSULE UP TO A MAX OF 3 CAPSULES AT BEDTIME  0  . losartan (COZAAR) 50 MG tablet Take 1 tablet (50 mg total) by mouth daily. 90 tablet 3  . metFORMIN (GLUCOPHAGE) 500 MG tablet Take 500 mg by mouth 2 (two) times daily.    . metoprolol succinate (TOPROL XL) 50 MG 24 hr tablet Take 1 tablet (50 mg total) by mouth daily. Take with or immediately following a meal. 90 tablet 3  . nitroGLYCERIN (NITROSTAT) 0.4 MG SL tablet Place 1 tablet (0.4 mg  total) under the tongue every 5 (five) minutes x 3 doses as needed for chest pain. 25 tablet 3  . predniSONE (STERAPRED UNI-PAK 21 TAB) 10 MG (21) TBPK tablet Take 6 tabs by mouth daily  for 2 days, then 5 tabs for 2 days, then 4 tabs for 2 days, then 3 tabs for 2 days, 2 tabs for 2 days, then 1 tab by mouth daily for 2 days 42 tablet 0  . vitamin C (ASCORBIC ACID) 500 MG tablet Take 1,000 mg by mouth daily.    [name] Kitchen zinc gluconate 50 MG tablet Take 50 mg by mouth daily.     No current facility-administered medications for this visit.      Past Surgical History:  Procedure Laterality Date  . APPENDECTOMY    . CARDIAC CATHETERIZATION N/A 01/12/2016   Procedure: Left Heart Cath and Coronary Angiography;  Surgeon: Sherren Mocha, MD;  Location: [facility];  Service: Cardiovascular;  Laterality: N/A;  . CARDIAC CATHETERIZATION N/A 01/12/2016   Procedure: Coronary Stent Intervention;  Surgeon: Sherren Mocha, MD;  Location: [facility];  Service: Cardiovascular;  Laterality: N/A;  . CORONARY STENT PLACEMENT  01/12/16     No Known Allergies    Family History  Problem Relation Age of Onset  .  Ovarian cancer Mother   . Diabetes Mother   . Hypertension Mother   . Prostate cancer Father   . Heart attack Neg Hx   . Stroke Neg Hx      Social History [name] reports that  has never smoked. he has never used smokeless tobacco. Mr. Darrell Darrell reports that he does not drink alcohol.   Review of Systems CONSTITUTIONAL: No weight loss, fever, chills, weakness or fatigue.  HEENT: Eyes: No visual loss, blurred vision, double vision or yellow sclerae.No hearing loss, sneezing, congestion, runny nose or sore throat.  SKIN: No rash or itching.  CARDIOVASCULAR:  RESPIRATORY: No shortness of breath, cough or sputum.  GASTROINTESTINAL: No anorexia, nausea, vomiting or diarrhea. No abdominal pain or blood.  GENITOURINARY: No burning on urination, no polyuria NEUROLOGICAL: No headache,  dizziness, syncope, paralysis, ataxia, numbness or tingling in the extremities. No change in bowel or bladder control.  MUSCULOSKELETAL: No muscle, back pain, joint pain or stiffness.  LYMPHATICS: No enlarged nodes. No history of splenectomy.  PSYCHIATRIC: No history of depression or anxiety.  ENDOCRINOLOGIC: No reports of sweating, cold or heat intolerance. No polyuria or polydipsia.  [name] Kitchen   Physical Examination There were no vitals filed for this visit. There were no vitals filed for this visit.  Gen: resting comfortably, no acute distress HEENT: no scleral icterus, pupils equal round and reactive, no palptable cervical adenopathy,  CV Resp: Clear to auscultation bilaterally GI: abdomen is soft, non-tender, non-distended, normal bowel sounds, no hepatosplenomegaly MSK: extremities are warm, no edema.  Skin: warm, no rash Neuro:  no focal deficits Psych: appropriate affect   Diagnostic Studies  Jan 2018  Study Conclusions  - Left ventricle: The cavity size was normal. Wall thickness was increased in a pattern of moderate LVH. Systolic function was normal. The estimated ejection fraction was in the range of 50% to 55%. Doppler parameters are consistent with abnormal left ventricular relaxation (grade 1 diastolic dysfunction). - Aortic valve: Mildly calcified annulus. Trileaflet; mildly thickened leaflets. Valve area (VTI): 2.26 cm^2. Valve area (Vmax): 2.1 cm^2. Valve area (Vmean): 2.01 cm^2. - Technically adequate study.    Assessment and Plan   1. CAD/Chronic systolic HF - no recent symptoms. EKG in clinic shows SR, no acute ischemic changes - mild LV systolic dysfunction has now normalized - continue current meds  2. HTN - at goal, continue current meds  3. Hyperlipidemia - continue statin   F/u 6 months      Arnoldo Lenis, M.D., F.A.C.C.

## 2017-11-24 ENCOUNTER — Ambulatory Visit: Payer: Medicare Other | Admitting: Cardiology

## 2017-12-06 DIAGNOSIS — N183 Chronic kidney disease, stage 3 (moderate): Secondary | ICD-10-CM | POA: Diagnosis not present

## 2017-12-06 DIAGNOSIS — Z79899 Other long term (current) drug therapy: Secondary | ICD-10-CM | POA: Diagnosis not present

## 2017-12-06 DIAGNOSIS — M0609 Rheumatoid arthritis without rheumatoid factor, multiple sites: Secondary | ICD-10-CM | POA: Diagnosis not present

## 2017-12-06 DIAGNOSIS — D649 Anemia, unspecified: Secondary | ICD-10-CM | POA: Diagnosis not present

## 2017-12-26 ENCOUNTER — Encounter: Payer: Self-pay | Admitting: Cardiology

## 2017-12-26 ENCOUNTER — Encounter: Payer: Self-pay | Admitting: *Deleted

## 2017-12-26 ENCOUNTER — Ambulatory Visit (INDEPENDENT_AMBULATORY_CARE_PROVIDER_SITE_OTHER): Payer: Medicare Other | Admitting: Cardiology

## 2017-12-26 VITALS — BP 124/70 | HR 75 | Ht 68.0 in | Wt 217.0 lb

## 2017-12-26 DIAGNOSIS — I10 Essential (primary) hypertension: Secondary | ICD-10-CM | POA: Diagnosis not present

## 2017-12-26 DIAGNOSIS — E782 Mixed hyperlipidemia: Secondary | ICD-10-CM | POA: Diagnosis not present

## 2017-12-26 DIAGNOSIS — I2510 Atherosclerotic heart disease of native coronary artery without angina pectoris: Secondary | ICD-10-CM

## 2017-12-26 NOTE — Progress Notes (Signed)
Clinical Summary Mr. Junco is a 83 y.o.male seen today for follow up of the following medical problems.   1. CAD - admit 12/2015 with inferior STEMI, received DES to RCA. Reperfusion complicated by VT, received DCCV.  - echo 12/2015 LVEF 40-45%.  - echo Jan 2018 LVEF 22-02%, grade I diastolic dysfunction   - no recent chest pain. No SOB/DOE - compliant with meds.  - [facility]. Rides stationary bike daily for 20 minutes.   2. HTN - compliant with meds.   3. Hyperlipidemia - compliant with statin       Past Medical History:  Diagnosis Date  . CAD (coronary artery disease), native coronary artery    cath 01/12/2016 50% ost D1, 60% prox LCx, 100% distal RCA lesion treated with DES  . CKD (chronic kidney disease), stage III 01/15/2016  . Diabetes mellitus without complication ([place])   . Hypertension   . Ischemic cardiomyopathy    Echo 01/13/2016 EF 40-45%  . ST elevation myocardial infarction (STEMI) of inferior wall ([place]) 01/12/2016     No Known Allergies   Current Outpatient Medications  Medication Sig Dispense Refill  . amLODipine (NORVASC) 10 MG tablet Take 1 tablet (10 mg total) by mouth daily. 90 tablet 3  . aspirin 81 MG chewable tablet Chew 1 tablet (81 mg total) by mouth daily.    [name] Kitchen atorvastatin (LIPITOR) 80 MG tablet Take 1 tablet (80 mg total) by mouth daily at 6 PM. 90 tablet 3  . famotidine (PEPCID) 40 MG tablet Take 40 mg by mouth daily.    [name] Kitchen gabapentin (NEURONTIN) 100 MG capsule TAKE 1 CAPSULE AT BEDTIME X 3 NIGHTS MAY INCREASE BY 1 CAPSULE UP TO A MAX OF 3 CAPSULES AT BEDTIME  0  . losartan (COZAAR) 50 MG tablet Take 1 tablet (50 mg total) by mouth daily. 90 tablet 3  . metFORMIN (GLUCOPHAGE) 500 MG tablet Take 500 mg by mouth 2 (two) times daily.    . metoprolol succinate (TOPROL XL) 50 MG 24 hr tablet Take 1 tablet (50 mg total) by mouth daily. Take with or immediately following a meal. 90 tablet 3  .  nitroGLYCERIN (NITROSTAT) 0.4 MG SL tablet Place 1 tablet (0.4 mg total) under the tongue every 5 (five) minutes x 3 doses as needed for chest pain. 25 tablet 3  . predniSONE (STERAPRED UNI-PAK 21 TAB) 10 MG (21) TBPK tablet Take 6 tabs by mouth daily  for 2 days, then 5 tabs for 2 days, then 4 tabs for 2 days, then 3 tabs for 2 days, 2 tabs for 2 days, then 1 tab by mouth daily for 2 days 42 tablet 0  . vitamin C (ASCORBIC ACID) 500 MG tablet Take 1,000 mg by mouth daily.    [name] Kitchen zinc gluconate 50 MG tablet Take 50 mg by mouth daily.     No current facility-administered medications for this visit.      Past Surgical History:  Procedure Laterality Date  . APPENDECTOMY    . CARDIAC CATHETERIZATION N/A 01/12/2016   Procedure: Left Heart Cath and Coronary Angiography;  Surgeon: Sherren Mocha, MD;  Location: [facility];  Service: Cardiovascular;  Laterality: N/A;  . CARDIAC CATHETERIZATION N/A 01/12/2016   Procedure: Coronary Stent Intervention;  Surgeon: Sherren Mocha, MD;  Location: [facility];  Service: Cardiovascular;  Laterality: N/A;  . CORONARY STENT PLACEMENT  01/12/16     No Known  Allergies    Family History  Problem Relation Age of Onset  . Ovarian cancer Mother   . Diabetes Mother   . Hypertension Mother   . Prostate cancer Father   . Heart attack Neg Hx   . Stroke Neg Hx      Social History Mr. Schuelke reports that  has never smoked. he has never used smokeless tobacco. Mr. Russey reports that he does not drink alcohol.   Review of Systems CONSTITUTIONAL: No weight loss, fever, chills, weakness or fatigue.  HEENT: Eyes: No visual loss, blurred vision, double vision or yellow sclerae.No hearing loss, sneezing, congestion, runny nose or sore throat.  SKIN: No rash or itching.  CARDIOVASCULAR: per hpi RESPIRATORY: No shortness of breath, cough or sputum.  GASTROINTESTINAL: No anorexia, nausea, vomiting or diarrhea. No abdominal pain or blood.    GENITOURINARY: No burning on urination, no polyuria NEUROLOGICAL: No headache, dizziness, syncope, paralysis, ataxia, numbness or tingling in the extremities. No change in bowel or bladder control.  MUSCULOSKELETAL: No muscle, back pain, joint pain or stiffness.  LYMPHATICS: No enlarged nodes. No history of splenectomy.  PSYCHIATRIC: No history of depression or anxiety.  ENDOCRINOLOGIC: No reports of sweating, cold or heat intolerance. No polyuria or polydipsia.  [name] Kitchen   Physical Examination Vitals:   12/26/17 0840  BP: 124/70  Pulse: 75  SpO2: 95%   Vitals:   12/26/17 0840  Weight: 217 lb (98.4 kg)  Height: 5\' 8"  (1.727 m)    Gen: resting comfortably, no acute distress HEENT: no scleral icterus, pupils equal round and reactive, no palptable cervical adenopathy,  CV: RRR, no m/r/g, no jvd Resp: Clear to auscultation bilaterally GI: abdomen is soft, non-tender, non-distended, normal bowel sounds, no hepatosplenomegaly MSK: extremities are warm, no edema.  Skin: warm, no rash Neuro:  no focal deficits Psych: appropriate affect   Diagnostic Studies Study Conclusions  - Left ventricle: The cavity size was normal. Wall thickness was increased in a pattern of moderate LVH. Systolic function was normal. The estimated ejection fraction was in the range of 50% to 55%. Doppler parameters are consistent with abnormal left ventricular relaxation (grade 1 diastolic dysfunction). - Aortic valve: Mildly calcified annulus. Trileaflet; mildly thickened leaflets. Valve area (VTI): 2.26 cm^2. Valve area (Vmax): 2.1 cm^2. Valve area (Vmean): 2.01 cm^2. - Technically adequate study.    Assessment and Plan  1. CAD/Chronic systolic HF - mild LV systolic dysfunction has now normalized - no recent symptoms, continue current meds  2. HTN - bp is at goal, continue current meds  3. Hyperlipidemia - request labs from pcp - continue statin   F/u 6  months      Arnoldo Lenis, M.D., F.A.C.C.

## 2017-12-26 NOTE — Patient Instructions (Signed)
Medication Instructions:  Your physician recommends that you continue on your current medications as directed. Please refer to the Current Medication list given to you today.   Labwork: We will request labs from your PCP   Testing/Procedures: NONE   Follow-Up: Your physician wants you to follow-up in: 6 Months with Dr. Harl Bowie.  You will receive a reminder letter in the mail two months in advance. If you don't receive a letter, please call our office to schedule the follow-up appointment.   Any Other Special Instructions Will Be Listed Below (If Applicable).     If you need a refill on your cardiac medications before your next appointment, please call your pharmacy.  Thank you for choosing [facility]!

## 2017-12-29 ENCOUNTER — Encounter: Payer: Self-pay | Admitting: Cardiology

## 2018-01-19 DIAGNOSIS — E1121 Type 2 diabetes mellitus with diabetic nephropathy: Secondary | ICD-10-CM | POA: Diagnosis not present

## 2018-01-19 DIAGNOSIS — I129 Hypertensive chronic kidney disease with stage 1 through stage 4 chronic kidney disease, or unspecified chronic kidney disease: Secondary | ICD-10-CM | POA: Diagnosis not present

## 2018-01-19 DIAGNOSIS — I25119 Atherosclerotic heart disease of native coronary artery with unspecified angina pectoris: Secondary | ICD-10-CM | POA: Diagnosis not present

## 2018-01-19 DIAGNOSIS — M069 Rheumatoid arthritis, unspecified: Secondary | ICD-10-CM | POA: Diagnosis not present

## 2018-02-10 IMAGING — DX DG HAND COMPLETE 3+V*R*
3 series · 3 of 3 positions shown · non-contrast
Comparison: None.

CLINICAL DATA: Bilateral hand swelling, left greater than right

EXAM:
RIGHT HAND - COMPLETE 3+ VIEW

[hand pa]
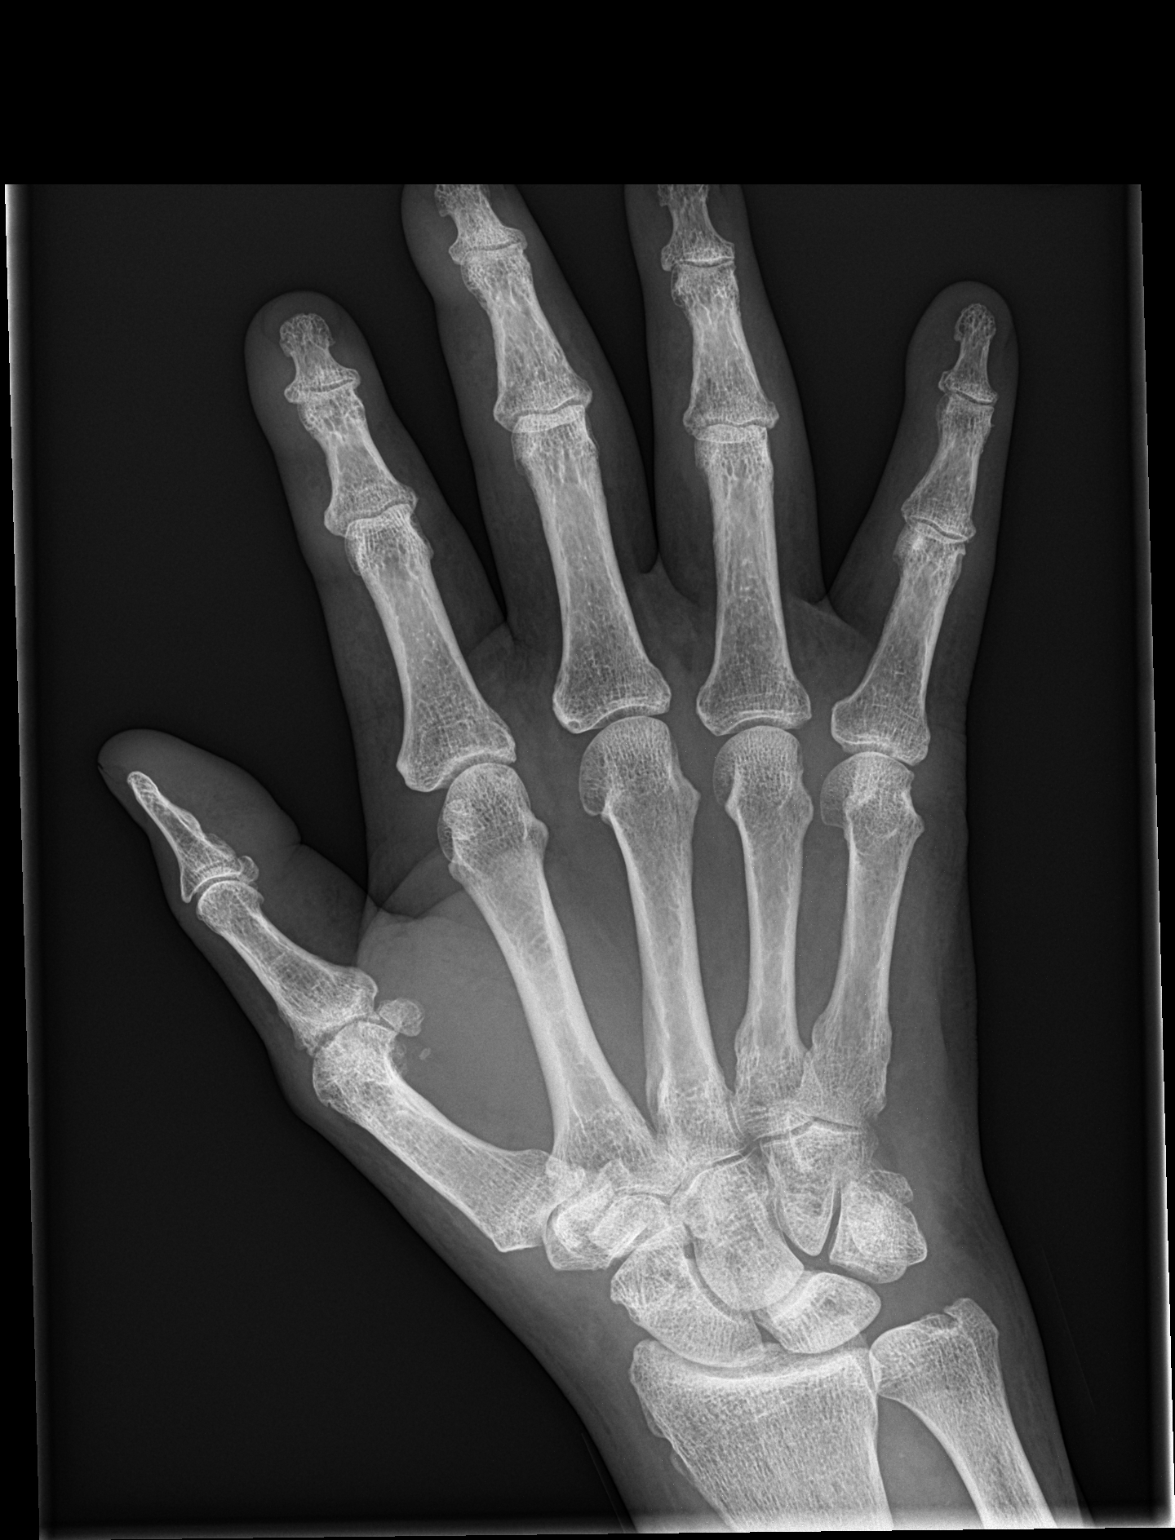

[hand obl]
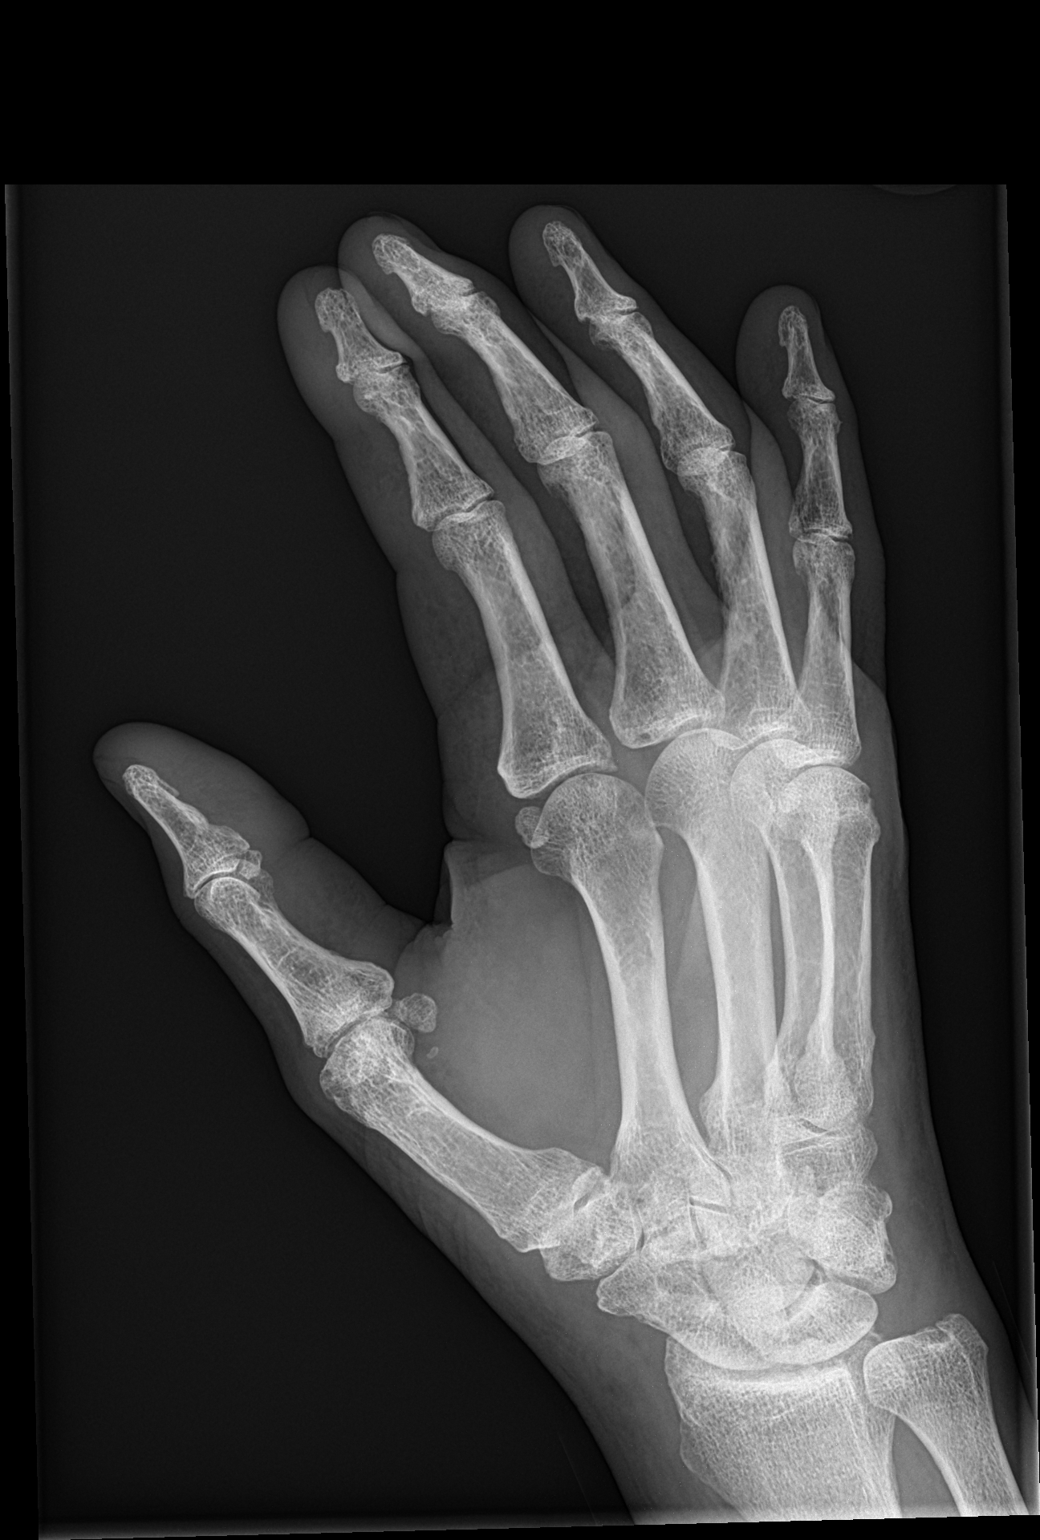

[hand lat]
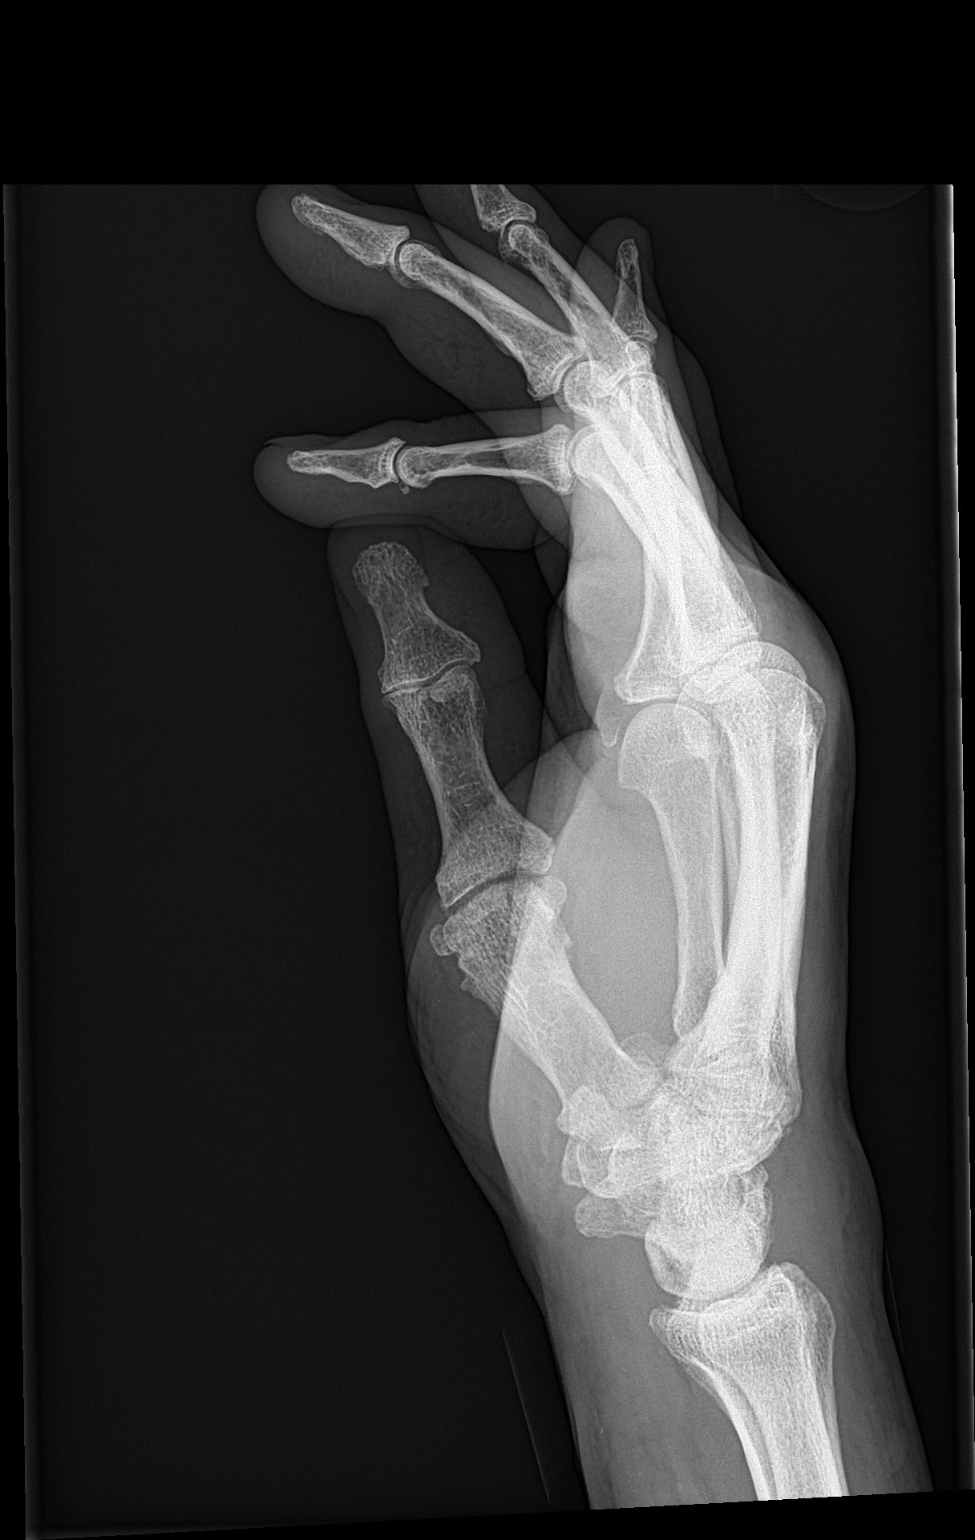

[3 of 3 positions shown; findings below may reference images not displayed]

FINDINGS: Mild joint space narrowing and spurring in the IP joints, most
pronounced in the DIP joints as well as the first MCP joint.
Remainder the MCP joints are unremarkable. No bony erosions. No
acute bony abnormality. Specifically, no fracture, subluxation, or
dislocation. Soft tissues are intact.
IMPRESSION: Mild osteoarthritic changes as above.  No acute bony abnormality.

## 2018-03-06 DIAGNOSIS — M0609 Rheumatoid arthritis without rheumatoid factor, multiple sites: Secondary | ICD-10-CM | POA: Diagnosis not present

## 2018-03-06 DIAGNOSIS — D649 Anemia, unspecified: Secondary | ICD-10-CM | POA: Diagnosis not present

## 2018-03-06 DIAGNOSIS — Z79899 Other long term (current) drug therapy: Secondary | ICD-10-CM | POA: Diagnosis not present

## 2018-03-06 DIAGNOSIS — N183 Chronic kidney disease, stage 3 (moderate): Secondary | ICD-10-CM | POA: Diagnosis not present

## 2018-03-06 DIAGNOSIS — K5909 Other constipation: Secondary | ICD-10-CM | POA: Diagnosis not present

## 2018-04-24 DIAGNOSIS — Z0000 Encounter for general adult medical examination without abnormal findings: Secondary | ICD-10-CM | POA: Diagnosis not present

## 2018-06-07 DIAGNOSIS — K5909 Other constipation: Secondary | ICD-10-CM | POA: Diagnosis not present

## 2018-06-07 DIAGNOSIS — M0609 Rheumatoid arthritis without rheumatoid factor, multiple sites: Secondary | ICD-10-CM | POA: Diagnosis not present

## 2018-06-07 DIAGNOSIS — D649 Anemia, unspecified: Secondary | ICD-10-CM | POA: Diagnosis not present

## 2018-06-07 DIAGNOSIS — N183 Chronic kidney disease, stage 3 (moderate): Secondary | ICD-10-CM | POA: Diagnosis not present

## 2018-06-07 DIAGNOSIS — Z79899 Other long term (current) drug therapy: Secondary | ICD-10-CM | POA: Diagnosis not present

## 2018-06-21 ENCOUNTER — Other Ambulatory Visit: Payer: Self-pay | Admitting: *Deleted

## 2018-06-21 MED ORDER — LOSARTAN POTASSIUM 50 MG PO TABS: 50.0000 mg | ORAL_TABLET | Freq: Every day | ORAL | 3 refills | Status: DC

## 2018-07-16 ENCOUNTER — Encounter: Payer: Self-pay | Admitting: Cardiology

## 2018-07-16 ENCOUNTER — Ambulatory Visit (INDEPENDENT_AMBULATORY_CARE_PROVIDER_SITE_OTHER): Payer: Medicare Other | Admitting: Cardiology

## 2018-07-16 VITALS — BP 140/72 | HR 74 | Ht 68.0 in | Wt 239.2 lb

## 2018-07-16 DIAGNOSIS — I10 Essential (primary) hypertension: Secondary | ICD-10-CM

## 2018-07-16 DIAGNOSIS — I2510 Atherosclerotic heart disease of native coronary artery without angina pectoris: Secondary | ICD-10-CM

## 2018-07-16 DIAGNOSIS — E782 Mixed hyperlipidemia: Secondary | ICD-10-CM

## 2018-07-16 NOTE — Patient Instructions (Signed)

## 2018-07-16 NOTE — Progress Notes (Signed)
Clinical Summary Darrell Marshall is a 83 y.o.male seen today for follow up of the following medical problems.   1. CAD - admit 12/2015 with inferior STEMI, received DES to RCA. Reperfusion complicated by VT, received defibrilation.  - echo 12/2015 LVEF 40-45%. - echo Jan 2018 LVEF 87-86%, grade I diastolic dysfunction   - no recent chest pain. Denies SOB/DOE - compliant with meds  2. HTN - he is compliant with meds, but has not taken meds yet today.   3. Hyperlipidemia -compliant with statin, last labs with pcp     Past Medical History:  Diagnosis Date  . CAD (coronary artery disease), native coronary artery    cath 01/12/2016 50% ost D1, 60% prox LCx, 100% distal RCA lesion treated with DES  . CKD (chronic kidney disease), stage III ([place]) 01/15/2016  . Diabetes mellitus without complication ([place])   . Hypertension   . Ischemic cardiomyopathy    Echo 01/13/2016 EF 40-45%  . ST elevation myocardial infarction (STEMI) of inferior wall ([place]) 01/12/2016     No Known Allergies   Current Outpatient Medications  Medication Sig Dispense Refill  . amLODipine (NORVASC) 10 MG tablet Take 1 tablet (10 mg total) by mouth daily. 90 tablet 3  . aspirin 81 MG chewable tablet Chew 1 tablet (81 mg total) by mouth daily.    [name] Kitchen atorvastatin (LIPITOR) 80 MG tablet Take 1 tablet (80 mg total) by mouth daily at 6 PM. 90 tablet 3  . famotidine (PEPCID) 40 MG tablet Take 40 mg by mouth daily.    [name] Kitchen leflunomide (ARAVA) 20 MG tablet Take 20 mg by mouth daily.    [name] Kitchen losartan (COZAAR) 50 MG tablet Take 1 tablet (50 mg total) by mouth daily. 90 tablet 3  . metFORMIN (GLUCOPHAGE) 500 MG tablet Take 500 mg by mouth 2 (two) times daily.    . metoprolol succinate (TOPROL XL) 50 MG 24 hr tablet Take 1 tablet (50 mg total) by mouth daily. Take with or immediately following a meal. 90 tablet 3  . nitroGLYCERIN (NITROSTAT) 0.4 MG SL tablet Place 1 tablet (0.4 mg total) under the tongue every 5 (five) minutes  x 3 doses as needed for chest pain. 25 tablet 3  . vitamin C (ASCORBIC ACID) 500 MG tablet Take 1,000 mg by mouth daily.    [name] Kitchen zinc gluconate 50 MG tablet Take 50 mg by mouth daily.     No current facility-administered medications for this visit.      Past Surgical History:  Procedure Laterality Date  . APPENDECTOMY    . CARDIAC CATHETERIZATION N/A 01/12/2016   Procedure: Left Heart Cath and Coronary Angiography;  Surgeon: Sherren Mocha, MD;  Location: [facility];  Service: Cardiovascular;  Laterality: N/A;  . CARDIAC CATHETERIZATION N/A 01/12/2016   Procedure: Coronary Stent Intervention;  Surgeon: Sherren Mocha, MD;  Location: [facility];  Service: Cardiovascular;  Laterality: N/A;  . CORONARY STENT PLACEMENT  01/12/16     No Known Allergies    Family History  Problem Relation Age of Onset  . Ovarian cancer Mother   . Diabetes Mother   . Hypertension Mother   . Prostate cancer Father   . Heart attack Neg Hx   . Stroke Neg Hx      Social History Darrell Marshall reports that he has never smoked. He has never used smokeless tobacco. Darrell Marshall reports that he does not drink alcohol.   Review of Systems CONSTITUTIONAL: No  weight loss, fever, chills, weakness or fatigue.  HEENT: Eyes: No visual loss, blurred vision, double vision or yellow sclerae.No hearing loss, sneezing, congestion, runny nose or sore throat.  SKIN: No rash or itching.  CARDIOVASCULAR: per hpi RESPIRATORY: No shortness of breath, cough or sputum.  GASTROINTESTINAL: No anorexia, nausea, vomiting or diarrhea. No abdominal pain or blood.  GENITOURINARY: No burning on urination, no polyuria NEUROLOGICAL: No headache, dizziness, syncope, paralysis, ataxia, numbness or tingling in the extremities. No change in bowel or bladder control.  MUSCULOSKELETAL: No muscle, back pain, joint pain or stiffness.  LYMPHATICS: No enlarged nodes. No history of splenectomy.  PSYCHIATRIC: No history of depression  or anxiety.  ENDOCRINOLOGIC: No reports of sweating, cold or heat intolerance. No polyuria or polydipsia.  [name] Kitchen   Physical Examination Vitals:   07/16/18 1456  BP: 140/72  Pulse: 74  SpO2: 98%   Vitals:   07/16/18 1456  Weight: 239 lb 3.2 oz (108.5 kg)  Height: 5\' 8"  (1.727 m)    Gen: resting comfortably, no acute distress HEENT: no scleral icterus, pupils equal round and reactive, no palptable cervical adenopathy,  CV: RRR, no m/r/g, no jvd Resp: Clear to auscultation bilaterally GI: abdomen is soft, non-tender, non-distended, normal bowel sounds, no hepatosplenomegaly MSK: extremities are warm, no edema.  Skin: warm, no rash Neuro:  no focal deficits Psych: appropriate affect   Diagnostic Studies  Jan 2018 echo Study Conclusions  - Left ventricle: The cavity size was normal. Wall thickness was increased in a pattern of moderate LVH. Systolic function was normal. The estimated ejection fraction was in the range of 50% to 55%. Doppler parameters are consistent with abnormal left ventricular relaxation (grade 1 diastolic dysfunction). - Aortic valve: Mildly calcified annulus. Trileaflet; mildly thickened leaflets. Valve area (VTI): 2.26 cm^2. Valve area (Vmax): 2.1 cm^2. Valve area (Vmean): 2.01 cm^2. - Technically adequate study.    Assessment and Plan   1. CAD - no recent symptoms, continue current medical therapy.  - EKG today in clniic shows SR, no acute ischemic changes  2. HTN - elevated in clniic but has not taken meds yet today, continue to monitor  3.Hyperlipidemia -he will continue statin, we will request labs from his pcp   F/u 6 months  Arnoldo Lenis, M.D.

## 2018-08-24 DIAGNOSIS — M069 Rheumatoid arthritis, unspecified: Secondary | ICD-10-CM | POA: Diagnosis not present

## 2018-08-24 DIAGNOSIS — I25119 Atherosclerotic heart disease of native coronary artery with unspecified angina pectoris: Secondary | ICD-10-CM | POA: Diagnosis not present

## 2018-08-24 DIAGNOSIS — Z23 Encounter for immunization: Secondary | ICD-10-CM | POA: Diagnosis not present

## 2018-08-24 DIAGNOSIS — E1121 Type 2 diabetes mellitus with diabetic nephropathy: Secondary | ICD-10-CM | POA: Diagnosis not present

## 2018-08-24 DIAGNOSIS — I129 Hypertensive chronic kidney disease with stage 1 through stage 4 chronic kidney disease, or unspecified chronic kidney disease: Secondary | ICD-10-CM | POA: Diagnosis not present

## 2018-09-06 DIAGNOSIS — K5909 Other constipation: Secondary | ICD-10-CM | POA: Diagnosis not present

## 2018-09-06 DIAGNOSIS — D649 Anemia, unspecified: Secondary | ICD-10-CM | POA: Diagnosis not present

## 2018-09-06 DIAGNOSIS — M0609 Rheumatoid arthritis without rheumatoid factor, multiple sites: Secondary | ICD-10-CM | POA: Diagnosis not present

## 2018-09-06 DIAGNOSIS — Z79899 Other long term (current) drug therapy: Secondary | ICD-10-CM | POA: Diagnosis not present

## 2018-12-20 DIAGNOSIS — K5909 Other constipation: Secondary | ICD-10-CM | POA: Diagnosis not present

## 2018-12-20 DIAGNOSIS — D649 Anemia, unspecified: Secondary | ICD-10-CM | POA: Diagnosis not present

## 2018-12-20 DIAGNOSIS — N183 Chronic kidney disease, stage 3 (moderate): Secondary | ICD-10-CM | POA: Diagnosis not present

## 2018-12-20 DIAGNOSIS — Z79899 Other long term (current) drug therapy: Secondary | ICD-10-CM | POA: Diagnosis not present

## 2018-12-20 DIAGNOSIS — M0609 Rheumatoid arthritis without rheumatoid factor, multiple sites: Secondary | ICD-10-CM | POA: Diagnosis not present

## 2018-12-25 DIAGNOSIS — I25119 Atherosclerotic heart disease of native coronary artery with unspecified angina pectoris: Secondary | ICD-10-CM | POA: Diagnosis not present

## 2018-12-25 DIAGNOSIS — N183 Chronic kidney disease, stage 3 (moderate): Secondary | ICD-10-CM | POA: Diagnosis not present

## 2018-12-25 DIAGNOSIS — E1121 Type 2 diabetes mellitus with diabetic nephropathy: Secondary | ICD-10-CM | POA: Diagnosis not present

## 2018-12-25 DIAGNOSIS — I129 Hypertensive chronic kidney disease with stage 1 through stage 4 chronic kidney disease, or unspecified chronic kidney disease: Secondary | ICD-10-CM | POA: Diagnosis not present

## 2018-12-25 DIAGNOSIS — N189 Chronic kidney disease, unspecified: Secondary | ICD-10-CM | POA: Diagnosis not present

## 2018-12-26 ENCOUNTER — Encounter: Payer: Self-pay | Admitting: Cardiology

## 2018-12-26 ENCOUNTER — Ambulatory Visit (INDEPENDENT_AMBULATORY_CARE_PROVIDER_SITE_OTHER): Payer: Medicare Other | Admitting: Cardiology

## 2018-12-26 VITALS — BP 134/78 | HR 76 | Ht 68.0 in | Wt 244.0 lb

## 2018-12-26 DIAGNOSIS — E782 Mixed hyperlipidemia: Secondary | ICD-10-CM

## 2018-12-26 DIAGNOSIS — I2510 Atherosclerotic heart disease of native coronary artery without angina pectoris: Secondary | ICD-10-CM

## 2018-12-26 DIAGNOSIS — I10 Essential (primary) hypertension: Secondary | ICD-10-CM

## 2018-12-26 NOTE — Progress Notes (Signed)
Clinical Summary Mr. Buechler is a 84 y.o.male seen today for follow up of the following medical problems.   1. CAD - admit 12/2015 with inferior STEMI, received DES to RCA. Reperfusion complicated by VT, received defibrilation.  - echo 12/2015 LVEF 40-45%. - echo Jan 2018 LVEF 16-10%, grade I diastolic dysfunction   - denies any chest pain, no SOB/DOE - compliant with meds   2. HTN - compliant with meds.   3. Hyperlipidemia - labs followed by pcp - compliant with statin   Past Medical History:  Diagnosis Date  . CAD (coronary artery disease), native coronary artery    cath 01/12/2016 50% ost D1, 60% prox LCx, 100% distal RCA lesion treated with DES  . CKD (chronic kidney disease), stage III ([place]) 01/15/2016  . Diabetes mellitus without complication ([place])   . Hypertension   . Ischemic cardiomyopathy    Echo 01/13/2016 EF 40-45%  . ST elevation myocardial infarction (STEMI) of inferior wall ([place]) 01/12/2016     No Known Allergies   Current Outpatient Medications  Medication Sig Dispense Refill  . amLODipine (NORVASC) 10 MG tablet Take 1 tablet (10 mg total) by mouth daily. 90 tablet 3  . aspirin 81 MG chewable tablet Chew 1 tablet (81 mg total) by mouth daily.    [name] Kitchen atorvastatin (LIPITOR) 80 MG tablet Take 1 tablet (80 mg total) by mouth daily at 6 PM. 90 tablet 3  . famotidine (PEPCID) 40 MG tablet Take 40 mg by mouth daily.    [name] Kitchen leflunomide (ARAVA) 20 MG tablet Take 20 mg by mouth daily.    [name] Kitchen losartan (COZAAR) 50 MG tablet Take 1 tablet (50 mg total) by mouth daily. 90 tablet 3  . metoprolol succinate (TOPROL-XL) 25 MG 24 hr tablet Take 25 mg by mouth 2 (two) times daily.  3  . nitroGLYCERIN (NITROSTAT) 0.4 MG SL tablet Place 1 tablet (0.4 mg total) under the tongue every 5 (five) minutes x 3 doses as needed for chest pain. 25 tablet 3  . vitamin B-12 (CYANOCOBALAMIN) 500 MCG tablet Take 500 mcg by mouth daily.    [name] Kitchen zinc gluconate 50 MG tablet Take 50 mg by mouth  daily.     No current facility-administered medications for this visit.      Past Surgical History:  Procedure Laterality Date  . APPENDECTOMY    . CARDIAC CATHETERIZATION N/A 01/12/2016   Procedure: Left Heart Cath and Coronary Angiography;  Surgeon: Sherren Mocha, MD;  Location: [facility];  Service: Cardiovascular;  Laterality: N/A;  . CARDIAC CATHETERIZATION N/A 01/12/2016   Procedure: Coronary Stent Intervention;  Surgeon: Sherren Mocha, MD;  Location: [facility];  Service: Cardiovascular;  Laterality: N/A;  . CORONARY STENT PLACEMENT  01/12/16     No Known Allergies    Family History  Problem Relation Age of Onset  . Ovarian cancer Mother   . Diabetes Mother   . Hypertension Mother   . Prostate cancer Father   . Heart attack Neg Hx   . Stroke Neg Hx      Social History Mr. Cadena reports that he has never smoked. He has never used smokeless tobacco. Mr. Patrone reports no history of alcohol use.   Review of Systems CONSTITUTIONAL: No weight loss, fever, chills, weakness or fatigue.  HEENT: Eyes: No visual loss, blurred vision, double vision or yellow sclerae.No hearing loss, sneezing, congestion, runny nose or sore throat.  SKIN: No rash or itching.  CARDIOVASCULAR:  per hpi RESPIRATORY: No shortness of breath, cough or sputum.  GASTROINTESTINAL: No anorexia, nausea, vomiting or diarrhea. No abdominal pain or blood.  GENITOURINARY: No burning on urination, no polyuria NEUROLOGICAL: No headache, dizziness, syncope, paralysis, ataxia, numbness or tingling in the extremities. No change in bowel or bladder control.  MUSCULOSKELETAL: No muscle, back pain, joint pain or stiffness.  LYMPHATICS: No enlarged nodes. No history of splenectomy.  PSYCHIATRIC: No history of depression or anxiety.  ENDOCRINOLOGIC: No reports of sweating, cold or heat intolerance. No polyuria or polydipsia.  [name] Kitchen   Physical Examination Vitals:   12/26/18 1052  BP: 134/78    Pulse: 76  SpO2: 94%   Vitals:   12/26/18 1052  Weight: 244 lb (110.7 kg)  Height: 5\' 8"  (1.727 m)    Gen: resting comfortably, no acute distress HEENT: no scleral icterus, pupils equal round and reactive, no palptable cervical adenopathy,  CV: RRR, no m/r/g no jvd Resp: Clear to auscultation bilaterally GI: abdomen is soft, non-tender, non-distended, normal bowel sounds, no hepatosplenomegaly MSK: extremities are warm, no edema.  Skin: warm, no rash Neuro:  no focal deficits Psych: appropriate affect   Diagnostic Studies Jan 2018 echo Study Conclusions  - Left ventricle: The cavity size was normal. Wall thickness was increased in a pattern of moderate LVH. Systolic function was normal. The estimated ejection fraction was in the range of 50% to 55%. Doppler parameters are consistent with abnormal left ventricular relaxation (grade 1 diastolic dysfunction). - Aortic valve: Mildly calcified annulus. Trileaflet; mildly thickened leaflets. Valve area (VTI): 2.26 cm^2. Valve area (Vmax): 2.1 cm^2. Valve area (Vmean): 2.01 cm^2. - Technically adequate study.    Assessment and Plan  1. CAD -no symptoms. Continue current meds -   2. HTN - essentially at goal, continue current meds  3.Hyperlipidemia -request labs from pcp, continue high dose statin in setting of CAD   F/u 6 months      Arnoldo Lenis, M.D.

## 2018-12-26 NOTE — Patient Instructions (Signed)

## 2019-03-21 DIAGNOSIS — N183 Chronic kidney disease, stage 3 (moderate): Secondary | ICD-10-CM | POA: Diagnosis not present

## 2019-03-21 DIAGNOSIS — K5909 Other constipation: Secondary | ICD-10-CM | POA: Diagnosis not present

## 2019-03-21 DIAGNOSIS — Z79899 Other long term (current) drug therapy: Secondary | ICD-10-CM | POA: Diagnosis not present

## 2019-03-21 DIAGNOSIS — M0609 Rheumatoid arthritis without rheumatoid factor, multiple sites: Secondary | ICD-10-CM | POA: Diagnosis not present

## 2019-03-21 DIAGNOSIS — D649 Anemia, unspecified: Secondary | ICD-10-CM | POA: Diagnosis not present

## 2019-04-04 DIAGNOSIS — Z79899 Other long term (current) drug therapy: Secondary | ICD-10-CM | POA: Diagnosis not present

## 2019-04-04 DIAGNOSIS — M0609 Rheumatoid arthritis without rheumatoid factor, multiple sites: Secondary | ICD-10-CM | POA: Diagnosis not present

## 2019-04-18 DIAGNOSIS — M0609 Rheumatoid arthritis without rheumatoid factor, multiple sites: Secondary | ICD-10-CM | POA: Diagnosis not present

## 2019-04-18 DIAGNOSIS — Z79899 Other long term (current) drug therapy: Secondary | ICD-10-CM | POA: Diagnosis not present

## 2019-05-06 DIAGNOSIS — E118 Type 2 diabetes mellitus with unspecified complications: Secondary | ICD-10-CM | POA: Diagnosis not present

## 2019-05-06 DIAGNOSIS — M069 Rheumatoid arthritis, unspecified: Secondary | ICD-10-CM | POA: Diagnosis not present

## 2019-05-06 DIAGNOSIS — Z0000 Encounter for general adult medical examination without abnormal findings: Secondary | ICD-10-CM | POA: Diagnosis not present

## 2019-05-06 DIAGNOSIS — I129 Hypertensive chronic kidney disease with stage 1 through stage 4 chronic kidney disease, or unspecified chronic kidney disease: Secondary | ICD-10-CM | POA: Diagnosis not present

## 2019-05-06 DIAGNOSIS — E1121 Type 2 diabetes mellitus with diabetic nephropathy: Secondary | ICD-10-CM | POA: Diagnosis not present

## 2019-05-06 LAB — LIPID PANEL
Cholesterol: 99 (ref 0–200)
HDL: 31 — AB (ref 35–70)
LDL Cholesterol: 13
Triglycerides: 276 — AB (ref 40–160)

## 2019-05-06 LAB — HEPATIC FUNCTION PANEL
ALT: 21 (ref 10–40)
AST: 21 (ref 14–40)
Alkaline Phosphatase: 173 — AB (ref 25–125)

## 2019-05-06 LAB — HM DIABETES FOOT EXAM

## 2019-06-24 DIAGNOSIS — M0609 Rheumatoid arthritis without rheumatoid factor, multiple sites: Secondary | ICD-10-CM | POA: Diagnosis not present

## 2019-06-24 DIAGNOSIS — N183 Chronic kidney disease, stage 3 (moderate): Secondary | ICD-10-CM | POA: Diagnosis not present

## 2019-06-24 DIAGNOSIS — Z79899 Other long term (current) drug therapy: Secondary | ICD-10-CM | POA: Diagnosis not present

## 2019-06-24 DIAGNOSIS — D649 Anemia, unspecified: Secondary | ICD-10-CM | POA: Diagnosis not present

## 2019-06-24 DIAGNOSIS — K5909 Other constipation: Secondary | ICD-10-CM | POA: Diagnosis not present

## 2019-06-26 DIAGNOSIS — M0609 Rheumatoid arthritis without rheumatoid factor, multiple sites: Secondary | ICD-10-CM | POA: Diagnosis not present

## 2019-06-27 ENCOUNTER — Encounter (HOSPITAL_COMMUNITY): Payer: Self-pay | Admitting: Emergency Medicine

## 2019-06-27 ENCOUNTER — Other Ambulatory Visit: Payer: Self-pay

## 2019-06-27 ENCOUNTER — Emergency Department (HOSPITAL_COMMUNITY)
Admission: EM | Admit: 2019-06-27 | Discharge: 2019-06-27 | Disposition: A | Discharge status: Home / Self Care | Payer: Medicare Other | Attending: Emergency Medicine | Admitting: Emergency Medicine

## 2019-06-27 DIAGNOSIS — I129 Hypertensive chronic kidney disease with stage 1 through stage 4 chronic kidney disease, or unspecified chronic kidney disease: Secondary | ICD-10-CM | POA: Insufficient documentation

## 2019-06-27 DIAGNOSIS — N183 Chronic kidney disease, stage 3 (moderate): Secondary | ICD-10-CM | POA: Diagnosis not present

## 2019-06-27 DIAGNOSIS — R739 Hyperglycemia, unspecified: Secondary | ICD-10-CM

## 2019-06-27 DIAGNOSIS — E1122 Type 2 diabetes mellitus with diabetic chronic kidney disease: Secondary | ICD-10-CM | POA: Diagnosis not present

## 2019-06-27 DIAGNOSIS — E1165 Type 2 diabetes mellitus with hyperglycemia: Secondary | ICD-10-CM | POA: Diagnosis not present

## 2019-06-27 DIAGNOSIS — Z7982 Long term (current) use of aspirin: Secondary | ICD-10-CM | POA: Diagnosis not present

## 2019-06-27 DIAGNOSIS — Z79899 Other long term (current) drug therapy: Secondary | ICD-10-CM | POA: Diagnosis not present

## 2019-06-27 DIAGNOSIS — I2510 Atherosclerotic heart disease of native coronary artery without angina pectoris: Secondary | ICD-10-CM | POA: Diagnosis not present

## 2019-06-27 LAB — BASIC METABOLIC PANEL
Anion gap: 11 (ref 5–15)
BUN: 16 mg/dL (ref 8–23)
CO2: 23 mmol/L (ref 22–32)
Calcium: 9.6 mg/dL (ref 8.9–10.3)
Chloride: 97 mmol/L — ABNORMAL LOW (ref 98–111)
Creatinine, Ser: 1.63 mg/dL — ABNORMAL HIGH (ref 0.61–1.24)
GFR calc Af Amer: 45 mL/min — ABNORMAL LOW (ref >60)
GFR calc non Af Amer: 39 mL/min — ABNORMAL LOW (ref >60)
Glucose, Bld: 529 mg/dL (ref 70–99)
Potassium: 4.4 mmol/L (ref 3.5–5.1)
Sodium: 131 mmol/L — ABNORMAL LOW (ref 135–145)

## 2019-06-27 LAB — URINALYSIS, ROUTINE W REFLEX MICROSCOPIC
Bacteria, UA: NONE SEEN
Bilirubin Urine: NEGATIVE
Glucose, UA: >=500 mg/dL — AB
Hgb urine dipstick: NEGATIVE
Ketones, ur: 5 mg/dL — AB
Leukocytes,Ua: NEGATIVE
Nitrite: NEGATIVE
Protein, ur: 30 mg/dL — AB
Specific Gravity, Urine: 1.024 (ref 1.005–1.030)
pH: 6 (ref 5.0–8.0)

## 2019-06-27 LAB — CBG MONITORING, ED
Glucose-Capillary: 511 mg/dL (ref 70–99)
Glucose-Capillary: 541 mg/dL (ref 70–99)
Glucose-Capillary: 429 mg/dL — ABNORMAL HIGH (ref 70–99)
Glucose-Capillary: 372 mg/dL — ABNORMAL HIGH (ref 70–99)
Glucose-Capillary: 320 mg/dL — ABNORMAL HIGH (ref 70–99)

## 2019-06-27 LAB — CBC
HCT: 42.4 % (ref 39.0–52.0)
Hemoglobin: 12.5 g/dL — ABNORMAL LOW (ref 13.0–17.0)
MCH: 21.2 pg — ABNORMAL LOW (ref 26.0–34.0)
MCHC: 29.5 g/dL — ABNORMAL LOW (ref 30.0–36.0)
MCV: 71.7 fL — ABNORMAL LOW (ref 80.0–100.0)
Platelets: 220 10*3/uL (ref 150–400)
RBC: 5.91 MIL/uL — ABNORMAL HIGH (ref 4.22–5.81)
RDW: 15.9 % — ABNORMAL HIGH (ref 11.5–15.5)
WBC: 6.8 10*3/uL (ref 4.0–10.5)
nRBC: 0 % (ref 0.0–0.2)

## 2019-06-27 MED ORDER — INSULIN STARTER KIT- PEN NEEDLES (ENGLISH)
1.0000 | Freq: Once | Status: AC
  Administered 2019-06-27: 1
  Filled 2019-06-27: qty 1

## 2019-06-27 MED ORDER — INSULIN REGULAR(HUMAN) IN NACL 100-0.9 UT/100ML-% IV SOLN
INTRAVENOUS | Status: DC
  Administered 2019-06-27: 4.8 [IU]/h via INTRAVENOUS
  Filled 2019-06-27: qty 100

## 2019-06-27 MED ORDER — SODIUM CHLORIDE 0.9 % IV SOLN: INTRAVENOUS | Status: DC

## 2019-06-27 MED ORDER — LIVING WELL WITH DIABETES BOOK
Freq: Once | Status: AC
  Administered 2019-06-27: 17:00:00
  Filled 2019-06-27: qty 1

## 2019-06-27 MED ORDER — GLIMEPIRIDE 1 MG PO TABS: 1.0000 mg | ORAL_TABLET | Freq: Every day | ORAL | 0 refills | Status: DC

## 2019-06-27 MED ORDER — SODIUM CHLORIDE 0.9 % IV BOLUS
1000.0000 mL | Freq: Once | INTRAVENOUS | Status: AC
  Administered 2019-06-27: 1000 mL via INTRAVENOUS

## 2019-06-27 MED ORDER — DEXTROSE-NACL 5-0.45 % IV SOLN: INTRAVENOUS | Status: DC

## 2019-06-27 NOTE — ED Notes (Signed)
EDP at bedside  

## 2019-06-27 NOTE — ED Provider Notes (Signed)
[facility] EMERGENCY DEPARTMENT Provider Note   CSN: [id number] Arrival date & time: 06/27/19  1051     History   Chief Complaint Chief Complaint  Patient presents with  . Hyperglycemia    HPI Darrell Marshall is a 84 y.o. male who  has a past medical history of CAD (coronary artery disease), native coronary artery, CKD (chronic kidney disease), stage III ([place]) (01/15/2016), Diabetes mellitus without complication ([place]), Hypertension, Ischemic cardiomyopathy, and ST elevation myocardial infarction (STEMI) of inferior wall ([place]) (01/12/2016), rheumatoid arthritis. He is immunosuppressed on leflunomide. Referred to the ED by rheumatologist secondary to elevated Blood sugar of 640 from lab work yesterday. Patient off of the metformin x 6 mos bc his sugars were fine (last hgb A1C less than ) and concern for kidney function + polyuria, polydipsia for a few weeks Sugars at home over the past week running in the 500s. No fevers, urinary sxs,or recent prednisone.      HPI  Past Medical History:  Diagnosis Date  . CAD (coronary artery disease), native coronary artery    cath 01/12/2016 50% ost D1, 60% prox LCx, 100% distal RCA lesion treated with DES  . CKD (chronic kidney disease), stage III ([place]) 01/15/2016  . Diabetes mellitus without complication ([place])   . Hypertension   . Ischemic cardiomyopathy    Echo 01/13/2016 EF 40-45%  . ST elevation myocardial infarction (STEMI) of inferior wall ([place]) 01/12/2016    Patient Active Problem List   Diagnosis Date Noted  . Dyslipidemia 01/22/2016  . CKD (chronic kidney disease), stage III ([place]) 01/15/2016  . Hypertension   . Diabetes mellitus with renal complications ([place])   . CAD S/P percutaneous coronary angioplasty   . Ischemic cardiomyopathy   . STEMI) of inferior wall 01/12/16 01/12/2016    Past Surgical History:  Procedure Laterality Date  . APPENDECTOMY    . CARDIAC CATHETERIZATION N/A 01/12/2016   Procedure: Left Heart Cath and  Coronary Angiography;  Surgeon: Sherren Mocha, MD;  Location: [place] [facility];  Service: Cardiovascular;  Laterality: N/A;  . CARDIAC CATHETERIZATION N/A 01/12/2016   Procedure: Coronary Stent Intervention;  Surgeon: Sherren Mocha, MD;  Location: [place] [facility];  Service: Cardiovascular;  Laterality: N/A;  . CORONARY STENT PLACEMENT  01/12/16        Home Medications    Prior to Admission medications   Medication Sig Start Date End Date Taking? Authorizing Provider  amLODipine (NORVASC) 10 MG tablet Take 1 tablet (10 mg total) by mouth daily. 01/16/16   Almyra Deforest, PA  aspirin 81 MG chewable tablet Chew 1 tablet (81 mg total) by mouth daily. 01/15/16   Almyra Deforest, PA  atorvastatin (LIPITOR) 80 MG tablet Take 1 tablet (80 mg total) by mouth daily at 6 PM. 01/15/16   Almyra Deforest, PA  famotidine (PEPCID) 40 MG tablet Take 40 mg by mouth daily. 01/16/14   [provider]  leflunomide (ARAVA) 20 MG tablet Take 20 mg by mouth daily.    [provider]  losartan (COZAAR) 50 MG tablet Take 1 tablet (50 mg total) by mouth daily. 06/21/18   Arnoldo Lenis, MD  metoprolol succinate (TOPROL-XL) 25 MG 24 hr tablet Take 25 mg by mouth 2 (two) times daily. 05/23/18   [provider]  nitroGLYCERIN (NITROSTAT) 0.4 MG SL tablet Place 1 tablet (0.4 mg total) under the tongue every 5 (five) minutes x 3 doses as needed for chest pain. 01/15/16   Almyra Deforest, [place]  vitamin B-12 (CYANOCOBALAMIN) 500 MCG tablet Take 500 mcg by mouth daily.    [provider]  zinc gluconate 50 MG tablet Take 50 mg by mouth daily.    [provider]    Family History Family History  Problem Relation Age of Onset  . Ovarian cancer Mother   . Diabetes Mother   . Hypertension Mother   . Prostate cancer Father   . Heart attack Neg Hx   . Stroke Neg Hx     Social History Social History   Tobacco Use  . Smoking status: Never Smoker  . Smokeless tobacco: Never Used  Substance Use  Topics  . Alcohol use: No  . Drug use: No     Allergies   Patient has no known allergies.   Review of Systems Review of Systems Ten systems reviewed and are negative for acute change, except as noted in the HPI.    Physical Exam Updated Vital Signs BP (!) 154/81 (BP Location: Right Arm)   Pulse 68   Temp 97.9 F (36.6 C) (Temporal)   Resp 18   Ht 5\' 8"  (1.727 m)   Wt 99.8 kg   SpO2 100%   BMI 33.45 kg/m   Physical Exam Vitals signs and nursing note reviewed.  Constitutional:      General: He is not in acute distress.    Appearance: He is well-developed. He is not diaphoretic.  HENT:     Head: Normocephalic and atraumatic.  Eyes:     General: No scleral icterus.    Conjunctiva/sclera: Conjunctivae normal.  Neck:     Musculoskeletal: Normal range of motion and neck supple.  Cardiovascular:     Rate and Rhythm: Normal rate and regular rhythm.     Heart sounds: Normal heart sounds.  Pulmonary:     Effort: Pulmonary effort is normal. No respiratory distress.     Breath sounds: Normal breath sounds.  Abdominal:     Palpations: Abdomen is soft.     Tenderness: There is no abdominal tenderness.  Skin:    General: Skin is warm and dry.  Neurological:     Mental Status: He is alert.  Psychiatric:        Behavior: Behavior normal.      ED Treatments / Results  Labs (all labs ordered are listed, but only abnormal results are displayed) Labs Reviewed  CBG MONITORING, ED - Abnormal; Notable for the following components:      Result Value   Glucose-Capillary 511 (*)    All other components within normal limits  BASIC METABOLIC PANEL  CBC  URINALYSIS, ROUTINE W REFLEX MICROSCOPIC  CBG MONITORING, ED    EKG None  Radiology No results found.  Procedures .Critical Care Performed by: Margarita Mail, PA-C Authorized by: Margarita Mail, PA-C   Critical care provider statement:    Critical care time (minutes):  30   Critical care was necessary to treat  or prevent imminent or life-threatening deterioration of the following conditions:  Metabolic crisis   Critical care was time spent personally by me on the following activities:  Discussions with consultants, evaluation of patient's response to treatment, examination of patient, ordering and performing treatments and interventions, ordering and review of laboratory studies, ordering and review of radiographic studies, pulse oximetry, re-evaluation of patient's condition, obtaining history from patient or surrogate and review of old charts   (including critical care time)  Medications Ordered in ED Medications - No data to display   Initial Impression /  Assessment and Plan / ED Course  I have reviewed the triage vital signs and the nursing notes.  Pertinent labs & imaging results that were available during my care of the patient were reviewed by me and considered in my medical decision making (see chart for details).  Clinical Course as of Jun 26 1841  Thu Jun 27, 2019  1502 Anion gap: 11 [AH]  1502 Creatinine(!): 1.63 [AH]  1502 Sodium(!): 131 [AH]  1502 Glucose(!!): 529 [AH]  1743 Glucose-Capillary(!): 320 [AH]    Clinical Course User Index [AH] Margarita Mail, PA-C       CC: Hyperglycemia VS:  Vitals:   06/27/19 1700 06/27/19 1730 06/27/19 1758 06/27/19 1827  BP: 134/81 134/79 134/84 (!) 145/78  Pulse: 73  74 84  Resp: 17 16 18 14   Temp:   98 F (36.7 C) 98.2 F (36.8 C)  TempSrc:   Oral Oral  SpO2: 99%  100% 99%  Weight:      Height:       [id number] is gathered by the patient, his daughter and review of EMR. [id number], DKA, Infection, medication noncompliance, ischemia, injury Labs: I reviewed the labs which show CBC shows mild microcytic anemia, BMP shows elevated blood glucose.  Creatinine is elevated but stable.  And has actually improved somewhat from previous months.  Patient has hyponatremia in the setting of elevated glucose which is corrected.  Urinalysis  shows no acute infections Imaging:  EKG: MDM: Patient with hyperglycemia.  His GFR is not good enough for use of his metformin which he was previously taking.  I reviewed the case with the pharmacist here at any [facility] who recommends Amaryl 1 g daily with breakfast and close glucose monitoring.  I discussed the medication with the patient and his daughter at bedside and let them know that he can have episodes of hypoglycemia and that he should be checking his sugars frequently.  We also discussed the warning signs of hypoglycemia to include confusion, cold sweat, tremulousness.  The patient is advised to follow very closely with his PCP and have advised that they actually just go to the office tomorrow if not should they should be seen Monday or Tuesday by the primary care physician.  The patient is otherwise very well-appearing.  He does not appear to have any significant metabolic disarray. Patient disposition: Discharge Patient condition: Good. The patient appears reasonably screened and/or stabilized for discharge and I doubt any other medical condition or other [facility] requiring further screening, evaluation, or treatment in the ED at this time prior to discharge. I have discussed lab and/or imaging findings with the patient and answered all questions/concerns to the best of my ability. I have discussed return precautions and OP follow up.      Final Clinical Impressions(s) / ED Diagnoses   Final diagnoses:  Hyperglycemia    ED Discharge Orders    None       Margarita Mail, PA-C 06/27/19 Lonell Face, MD 06/28/19 [phone]

## 2019-06-27 NOTE — Progress Notes (Signed)
Inpatient Diabetes Program Recommendations  AACE/ADA: New Consensus Statement on Inpatient Glycemic Control (2015)  Target Ranges:  Prepandial:   less than 140 mg/dL      Peak postprandial:   less than 180 mg/dL (1-2 hours)      Critically ill patients:  140 - 180 mg/dL   Lab Results  Component Value Date   GLUCAP 372 (H) 06/27/2019   HGBA1C 6.4 (H) 01/13/2016    Review of Glycemic Control  Diabetes history: DM2 Outpatient Diabetes medications: None. Previously on metformin. D/C'd by PCP Current orders for Inpatient glycemic control: IV insulin  CBG 511 on admission HgbA1C pending  Inpatient Diabetes Program Recommendations:    Continue with insulin drip until criteria met for discontinuation. Will likely need insulin at home. Order Living Well book and insulin pen starter kit  Diabetes Coordinator to see in am.  Continue to follow.  Thank you. Lorenda Peck, RD, LDN, CDE Inpatient Diabetes Coordinator [phone]

## 2019-06-27 NOTE — Discharge Instructions (Addendum)
Contact a health care provider if: °Your blood glucose is at or above 240 mg/dL (13.3 mmol/L) for 2 days in a row. °You have problems keeping your blood glucose in your target range. °You have frequent episodes of hyperglycemia. °Get help right away if: °You have difficulty breathing. °You have a change in how you think, feel, or act (mental status). °You have nausea or vomiting that does not go away. °

## 2019-06-27 NOTE — ED Notes (Signed)
EDP at bedside updating patient and family. 

## 2019-06-27 NOTE — ED Notes (Signed)
Per PA, insulin start kit ordered in error.

## 2019-06-27 NOTE — ED Triage Notes (Signed)
Pt had lab work done yesterday, blood sugar was in 600's.  Was on metformin but discontinued by pcp.

## 2019-07-02 ENCOUNTER — Telehealth: Payer: Self-pay | Admitting: Cardiology

## 2019-07-02 NOTE — Telephone Encounter (Signed)
Virtual Visit Pre-Appointment Phone Call  "(Name), I am calling you today to discuss your upcoming appointment. We are currently trying to limit exposure to the virus that causes COVID-19 by seeing patients at home rather than in the office."  1. "What is the BEST phone number to call the day of the visit?" - include this in appointment notes  2. Do you have or have access to (through a family member/friend) a smartphone with video capability that we can use for your visit?" a. If yes - list this number in appt notes as cell (if different from BEST phone #) and list the appointment type as a VIDEO visit in appointment notes b. If no - list the appointment type as a PHONE visit in appointment notes  3. Confirm consent - "In the setting of the current Covid19 crisis, you are scheduled for a (phone or video) visit with your provider on (date) at (time).  Just as we do with many in-office visits, in order for you to participate in this visit, we must obtain consent.  If you'd like, I can send this to your mychart (if signed up) or email for you to review.  Otherwise, I can obtain your verbal consent now.  All virtual visits are billed to your insurance company just like a normal visit would be.  By agreeing to a virtual visit, we'd like you to understand that the technology does not allow for your provider to perform an examination, and thus may limit your provider's ability to fully assess your condition. If your provider identifies any concerns that need to be evaluated in person, we will make arrangements to do so.  Finally, though the technology is pretty good, we cannot assure that it will always work on either your or our end, and in the setting of a video visit, we may have to convert it to a phone-only visit.  In either situation, we cannot ensure that we have a secure connection.  Are you willing to proceed?" STAFF: Did the patient verbally acknowledge consent to telehealth visit? Document  YES/NO here: Yes  4. Advise patient to be prepared - "Two hours prior to your appointment, go ahead and check your blood pressure, pulse, oxygen saturation, and your weight (if you have the equipment to check those) and write them all down. When your visit starts, your provider will ask you for this information. If you have an Apple Watch or Kardia device, please plan to have heart rate information ready on the day of your appointment. Please have a pen and paper handy nearby the day of the visit as well."  5. Give patient instructions for MyChart download to smartphone OR [email or web address] as below if video visit (depending on what platform provider is using)  6. Inform patient they will receive a phone call 15 minutes prior to their appointment time (may be from unknown caller ID) so they should be prepared to answer    [place] has been deemed a candidate for a follow-up tele-health visit to limit community exposure during the Covid-19 pandemic. I spoke with the patient via phone to ensure availability of phone/video source, confirm preferred email & phone number, and discuss instructions and expectations.  I reminded JAMUAL BASS to be prepared with any vital sign and/or heart rhythm information that could potentially be obtained via home monitoring, at the time of his visit. I reminded T[place] ROUTSON to expect a phone call prior to  his visit.  Bertram Gala Goins 07/02/2019 10:41 AM

## 2019-07-04 ENCOUNTER — Other Ambulatory Visit: Payer: Self-pay

## 2019-07-04 ENCOUNTER — Telehealth (INDEPENDENT_AMBULATORY_CARE_PROVIDER_SITE_OTHER): Payer: Medicare Other | Admitting: Cardiology

## 2019-07-04 ENCOUNTER — Encounter: Payer: Self-pay | Admitting: Cardiology

## 2019-07-04 VITALS — BP 138/70 | Ht 68.5 in | Wt 215.0 lb

## 2019-07-04 DIAGNOSIS — I2510 Atherosclerotic heart disease of native coronary artery without angina pectoris: Secondary | ICD-10-CM

## 2019-07-04 DIAGNOSIS — I10 Essential (primary) hypertension: Secondary | ICD-10-CM

## 2019-07-04 DIAGNOSIS — E1165 Type 2 diabetes mellitus with hyperglycemia: Secondary | ICD-10-CM | POA: Diagnosis not present

## 2019-07-04 DIAGNOSIS — N183 Chronic kidney disease, stage 3 (moderate): Secondary | ICD-10-CM | POA: Diagnosis not present

## 2019-07-04 DIAGNOSIS — E782 Mixed hyperlipidemia: Secondary | ICD-10-CM

## 2019-07-04 NOTE — Progress Notes (Signed)
Virtual Visit via Telephone Note   This visit type was conducted due to national recommendations for restrictions regarding the COVID-19 Pandemic (e.g. social distancing) in an effort to limit this patient's exposure and mitigate transmission in our community.  Due to his co-morbid illnesses, this patient is at least at moderate risk for complications without adequate follow up.  This format is felt to be most appropriate for this patient at this time.  The patient did not have access to video technology/had technical difficulties with video requiring transitioning to audio format only (telephone).  All issues noted in this document were discussed and addressed.  No physical exam could be performed with this format.  Please refer to the patient's chart for his  consent to telehealth for [facility].   Date:  07/04/2019   ID:  Darrell Marshall, DOB Jun 23, 1935, MRN [id number]  Patient Location: Home Provider Location: Office  PCP:  Sinda Du, MD  Cardiologist:  Carlyle Dolly, MD  Electrophysiologist:  None   Evaluation Performed:  Follow-Up Visit  Chief Complaint:  Follow up  History of Present Illness:    Darrell Marshall is a 84 y.o. male seen today for follow up of the following medical problems.   1. CAD - admit 12/2015 with inferior STEMI, received DES to RCA. Reperfusion complicated by VT, receiveddefibrilation.  - echo 12/2015 LVEF 40-45%. - echo Jan 2018 LVEF 99991111, grade I diastolic dysfunction    - no recent chest pain. Denies any SOB/DOE - compliant with meds   2. HTN - he is compliant with meds     3. Hyperlipidemia - - labs followed by pcp - compliant with statin   The patient does not have symptoms concerning for COVID-19 infection (fever, chills, cough, or new shortness of breath).    Past Medical History:  Diagnosis Date  . CAD (coronary artery disease), native coronary artery    cath 01/12/2016 50% ost D1, 60% prox LCx, 100% distal RCA  lesion treated with DES  . CKD (chronic kidney disease), stage III ([place]) 01/15/2016  . Diabetes mellitus without complication ([place])   . Hypertension   . Ischemic cardiomyopathy    Echo 01/13/2016 EF 40-45%  . ST elevation myocardial infarction (STEMI) of inferior wall ([place]) 01/12/2016   Past Surgical History:  Procedure Laterality Date  . APPENDECTOMY    . CARDIAC CATHETERIZATION N/A 01/12/2016   Procedure: Left Heart Cath and Coronary Angiography;  Surgeon: Sherren Mocha, MD;  Location: [place] [facility];  Service: Cardiovascular;  Laterality: N/A;  . CARDIAC CATHETERIZATION N/A 01/12/2016   Procedure: Coronary Stent Intervention;  Surgeon: Sherren Mocha, MD;  Location: [place] [facility];  Service: Cardiovascular;  Laterality: N/A;  . CORONARY STENT PLACEMENT  01/12/16     No outpatient medications have been marked as taking for the 07/04/19 encounter (Appointment) with Arnoldo Lenis, MD.     Allergies:   Patient has no known allergies.   Social History   Tobacco Use  . Smoking status: Never Smoker  . Smokeless tobacco: Never Used  Substance Use Topics  . Alcohol use: No  . Drug use: No     Family Hx: The patient's family history includes Diabetes in his mother; Hypertension in his mother; Ovarian cancer in his mother; Prostate cancer in his father. There is no history of Heart attack or Stroke.  ROS:   Please see the history of present illness.     All other systems reviewed and are negative.  Prior CV studies:   The following studies were reviewed today:  Jan 2018 echo Study Conclusions  - Left ventricle: The cavity size was normal. Wall thickness was increased in a pattern of moderate LVH. Systolic function was normal. The estimated ejection fraction was in the range of 50% to 55%. Doppler parameters are consistent with abnormal left ventricular relaxation (grade 1 diastolic dysfunction). - Aortic valve: Mildly calcified annulus. Trileaflet; mildly  thickened leaflets. Valve area (VTI): 2.26 cm^2. Valve area (Vmax): 2.1 cm^2. Valve area (Vmean): 2.01 cm^2. - Technically adequate study.  Labs/Other Tests and Data Reviewed:    EKG:  No ECG reviewed.  Recent Labs: 06/27/2019: BUN 16; Creatinine, Ser 1.63; Hemoglobin 12.5; Platelets 220; Potassium 4.4; Sodium 131   Recent Lipid Panel Lab Results  Component Value Date/Time   CHOL 157 01/13/2016 01:05 AM   TRIG 208 (H) 01/13/2016 01:05 AM   HDL 33 (L) 01/13/2016 01:05 AM   CHOLHDL 4.8 01/13/2016 01:05 AM   LDLCALC 82 01/13/2016 01:05 AM    Wt Readings from Last 3 Encounters:  06/27/19 220 lb (99.8 kg)  12/26/18 244 lb (110.7 kg)  07/16/18 239 lb 3.2 oz (108.5 kg)     Objective:    Vital Signs:   Today's Vitals   07/04/19 0925  Weight: 215 lb (97.5 kg)  Height: 5' 8.5" (1.74 m)   Body mass index is 32.22 kg/m. Normal affect. Normal speech pattern and tone. Comfortable,no apparent distress. No audible signs of SOB or wheezing.   ASSESSMENT & PLAN:    1. CAD -denies any symptoms, conitnue current meds  2. HTN - continue current meds  3.Hyperlipidemia - continue statin - request labs from pcp    F/u 6 months  COVID-19 Education: The signs and symptoms of COVID-19 were discussed with the patient and how to seek care for testing (follow up with PCP or arrange E-visit).  The importance of social distancing was discussed today.  Time:   Today, I have spent 17 minutes with the patient with telehealth technology discussing the above problems.     Medication Adjustments/Labs and Tests Ordered: Current medicines are reviewed at length with the patient today.  Concerns regarding medicines are outlined above.   Tests Ordered: No orders of the defined types were placed in this encounter.   Medication Changes: No orders of the defined types were placed in this encounter.   Follow Up:  In Person in 6 month(s)  Signed, Carlyle Dolly, MD  07/04/2019  9:23 AM    [place]

## 2019-07-04 NOTE — Patient Instructions (Signed)

## 2019-07-16 ENCOUNTER — Other Ambulatory Visit: Payer: Self-pay | Admitting: Cardiology

## 2019-08-07 DIAGNOSIS — E1121 Type 2 diabetes mellitus with diabetic nephropathy: Secondary | ICD-10-CM | POA: Diagnosis not present

## 2019-08-07 DIAGNOSIS — I25119 Atherosclerotic heart disease of native coronary artery with unspecified angina pectoris: Secondary | ICD-10-CM | POA: Diagnosis not present

## 2019-08-07 DIAGNOSIS — I10 Essential (primary) hypertension: Secondary | ICD-10-CM | POA: Diagnosis not present

## 2019-08-07 DIAGNOSIS — I5022 Chronic systolic (congestive) heart failure: Secondary | ICD-10-CM | POA: Diagnosis not present

## 2019-08-07 DIAGNOSIS — Z23 Encounter for immunization: Secondary | ICD-10-CM | POA: Diagnosis not present

## 2019-09-23 DIAGNOSIS — Z79899 Other long term (current) drug therapy: Secondary | ICD-10-CM | POA: Diagnosis not present

## 2019-09-23 DIAGNOSIS — D649 Anemia, unspecified: Secondary | ICD-10-CM | POA: Diagnosis not present

## 2019-09-23 DIAGNOSIS — R21 Rash and other nonspecific skin eruption: Secondary | ICD-10-CM | POA: Diagnosis not present

## 2019-09-23 DIAGNOSIS — K5909 Other constipation: Secondary | ICD-10-CM | POA: Diagnosis not present

## 2019-09-23 DIAGNOSIS — M0609 Rheumatoid arthritis without rheumatoid factor, multiple sites: Secondary | ICD-10-CM | POA: Diagnosis not present

## 2019-10-07 DIAGNOSIS — E1121 Type 2 diabetes mellitus with diabetic nephropathy: Secondary | ICD-10-CM | POA: Diagnosis not present

## 2019-10-07 DIAGNOSIS — N183 Chronic kidney disease, stage 3 unspecified: Secondary | ICD-10-CM | POA: Diagnosis not present

## 2019-10-07 DIAGNOSIS — E118 Type 2 diabetes mellitus with unspecified complications: Secondary | ICD-10-CM | POA: Diagnosis not present

## 2019-10-07 DIAGNOSIS — I10 Essential (primary) hypertension: Secondary | ICD-10-CM | POA: Diagnosis not present

## 2019-10-07 LAB — BASIC METABOLIC PANEL
BUN: 21 (ref 4–21)
CO2: 23 — AB (ref 13–22)
Chloride: 105 (ref 99–108)
Creatinine: 1.8 — AB (ref 0.6–1.3)
Glucose: 119
Potassium: 4 (ref 3.4–5.3)
Sodium: 140 (ref 137–147)

## 2019-10-07 LAB — HEMOGLOBIN A1C: Hemoglobin A1C: 6.3

## 2019-10-07 LAB — COMPREHENSIVE METABOLIC PANEL WITH GFR
Calcium: 9 (ref 8.7–10.7)
GFR calc Af Amer: 40
GFR calc non Af Amer: 35

## 2019-10-10 DIAGNOSIS — I25119 Atherosclerotic heart disease of native coronary artery with unspecified angina pectoris: Secondary | ICD-10-CM | POA: Diagnosis not present

## 2019-10-10 DIAGNOSIS — E1121 Type 2 diabetes mellitus with diabetic nephropathy: Secondary | ICD-10-CM | POA: Diagnosis not present

## 2019-10-10 DIAGNOSIS — I10 Essential (primary) hypertension: Secondary | ICD-10-CM | POA: Diagnosis not present

## 2019-10-10 DIAGNOSIS — N183 Chronic kidney disease, stage 3 unspecified: Secondary | ICD-10-CM | POA: Diagnosis not present

## 2019-10-27 DIAGNOSIS — I10 Essential (primary) hypertension: Secondary | ICD-10-CM | POA: Insufficient documentation

## 2019-10-27 DIAGNOSIS — I129 Hypertensive chronic kidney disease with stage 1 through stage 4 chronic kidney disease, or unspecified chronic kidney disease: Secondary | ICD-10-CM

## 2019-10-27 DIAGNOSIS — I25119 Atherosclerotic heart disease of native coronary artery with unspecified angina pectoris: Secondary | ICD-10-CM | POA: Insufficient documentation

## 2019-10-27 DIAGNOSIS — M069 Rheumatoid arthritis, unspecified: Secondary | ICD-10-CM | POA: Insufficient documentation

## 2019-10-27 DIAGNOSIS — E1121 Type 2 diabetes mellitus with diabetic nephropathy: Secondary | ICD-10-CM

## 2019-10-27 DIAGNOSIS — E118 Type 2 diabetes mellitus with unspecified complications: Secondary | ICD-10-CM

## 2019-10-27 DIAGNOSIS — I220 Subsequent ST elevation (STEMI) myocardial infarction of anterior wall: Secondary | ICD-10-CM

## 2019-10-27 DIAGNOSIS — I5022 Chronic systolic (congestive) heart failure: Secondary | ICD-10-CM

## 2019-10-27 DIAGNOSIS — E669 Obesity, unspecified: Secondary | ICD-10-CM | POA: Insufficient documentation

## 2019-10-27 DIAGNOSIS — G5603 Carpal tunnel syndrome, bilateral upper limbs: Secondary | ICD-10-CM | POA: Insufficient documentation

## 2019-10-27 DIAGNOSIS — I5030 Unspecified diastolic (congestive) heart failure: Secondary | ICD-10-CM | POA: Insufficient documentation

## 2019-10-27 DIAGNOSIS — N183 Chronic kidney disease, stage 3 unspecified: Secondary | ICD-10-CM

## 2019-11-06 ENCOUNTER — Other Ambulatory Visit (HOSPITAL_COMMUNITY): Payer: Self-pay | Admitting: Nephrology

## 2019-11-06 ENCOUNTER — Other Ambulatory Visit: Payer: Self-pay | Admitting: Nephrology

## 2019-11-06 DIAGNOSIS — N189 Chronic kidney disease, unspecified: Secondary | ICD-10-CM | POA: Diagnosis not present

## 2019-11-06 DIAGNOSIS — D638 Anemia in other chronic diseases classified elsewhere: Secondary | ICD-10-CM | POA: Diagnosis not present

## 2019-11-06 DIAGNOSIS — I5032 Chronic diastolic (congestive) heart failure: Secondary | ICD-10-CM | POA: Diagnosis not present

## 2019-11-06 DIAGNOSIS — I129 Hypertensive chronic kidney disease with stage 1 through stage 4 chronic kidney disease, or unspecified chronic kidney disease: Secondary | ICD-10-CM | POA: Diagnosis not present

## 2019-11-06 DIAGNOSIS — R809 Proteinuria, unspecified: Secondary | ICD-10-CM | POA: Diagnosis not present

## 2019-11-25 ENCOUNTER — Ambulatory Visit (HOSPITAL_COMMUNITY)
Admission: RE | Admit: 2019-11-25 | Discharge: 2019-11-25 | Disposition: A | Discharge status: Home / Self Care | Payer: Medicare Other | Source: Ambulatory Visit | Attending: Nephrology | Admitting: Nephrology

## 2019-11-25 ENCOUNTER — Other Ambulatory Visit: Payer: Self-pay

## 2019-11-25 DIAGNOSIS — Z79899 Other long term (current) drug therapy: Secondary | ICD-10-CM | POA: Diagnosis not present

## 2019-11-25 DIAGNOSIS — I5032 Chronic diastolic (congestive) heart failure: Secondary | ICD-10-CM | POA: Diagnosis not present

## 2019-11-25 DIAGNOSIS — E559 Vitamin D deficiency, unspecified: Secondary | ICD-10-CM | POA: Diagnosis not present

## 2019-11-25 DIAGNOSIS — N189 Chronic kidney disease, unspecified: Secondary | ICD-10-CM | POA: Insufficient documentation

## 2019-11-25 DIAGNOSIS — E1129 Type 2 diabetes mellitus with other diabetic kidney complication: Secondary | ICD-10-CM | POA: Diagnosis not present

## 2019-11-25 DIAGNOSIS — R809 Proteinuria, unspecified: Secondary | ICD-10-CM | POA: Diagnosis not present

## 2019-11-25 DIAGNOSIS — N281 Cyst of kidney, acquired: Secondary | ICD-10-CM | POA: Diagnosis not present

## 2019-11-25 DIAGNOSIS — E1122 Type 2 diabetes mellitus with diabetic chronic kidney disease: Secondary | ICD-10-CM | POA: Diagnosis not present

## 2019-11-25 DIAGNOSIS — I129 Hypertensive chronic kidney disease with stage 1 through stage 4 chronic kidney disease, or unspecified chronic kidney disease: Secondary | ICD-10-CM | POA: Diagnosis not present

## 2019-12-04 DIAGNOSIS — E211 Secondary hyperparathyroidism, not elsewhere classified: Secondary | ICD-10-CM | POA: Diagnosis not present

## 2019-12-04 DIAGNOSIS — R809 Proteinuria, unspecified: Secondary | ICD-10-CM | POA: Diagnosis not present

## 2019-12-04 DIAGNOSIS — I5032 Chronic diastolic (congestive) heart failure: Secondary | ICD-10-CM | POA: Diagnosis not present

## 2019-12-04 DIAGNOSIS — D638 Anemia in other chronic diseases classified elsewhere: Secondary | ICD-10-CM | POA: Diagnosis not present

## 2019-12-04 DIAGNOSIS — N189 Chronic kidney disease, unspecified: Secondary | ICD-10-CM | POA: Diagnosis not present

## 2019-12-12 DIAGNOSIS — H43812 Vitreous degeneration, left eye: Secondary | ICD-10-CM | POA: Diagnosis not present

## 2019-12-12 DIAGNOSIS — H25813 Combined forms of age-related cataract, bilateral: Secondary | ICD-10-CM | POA: Diagnosis not present

## 2019-12-18 NOTE — H&P (Signed)
Surgical History & Physical  Patient Name: Darrell Marshall DOB: 1935/06/11  Surgery: Cataract extraction with intraocular lens implant phacoemulsification; Right Eye  Surgeon: Baruch Goldmann MD Surgery Date:  12/30/2019 Pre-Op Date:  12/12/2019  HPI: A 11 Yr. old male patient is referred by Dr Jorja Loa for cataract eval. 1. The patient complains of difficulty when viewing TV, reading closed caption, news scrolls on TV, which began less than 1 month ago. Both eyes are affected. The episode is gradual. The condition's severity increased since last visit. Symptoms occur when the patient is driving, inside and outside. The complaint is associated with glare. This is negatively affecting his quality of life. HPI Completed by Dr. Baruch Goldmann  Medical History: Cataracts Diabetes Heart Problem High Blood Pressure LDL  Review of Systems Negative Allergic/Immunologic Negative Cardiovascular Negative Constitutional Negative Ear, Nose, Mouth & Throat Negative Endocrine Negative Eyes Negative Gastrointestinal Negative Genitourinary Negative Hemotologic/Lymphatic Negative Integumentary Negative Musculoskeletal Negative Neurological Negative Psychiatry Negative Respiratory  Social   Never smoked   Medication Famotidine, Aspirin, Glimepiride, Atorvastatin, Metoprolol, Leflunomide, Multivitamin, Vitamin B-12,   Sx/Procedures Appendectomy, Cardiac Stents,   Drug Allergies   NKDA  History & Physical: Heent:  Cataract, Right eye NECK: supple without bruits LUNGS: lungs clear to auscultation CV: regular rate and rhythm Abdomen: soft and non-tender  Impression & Plan: Assessment: 1.  COMBINED FORMS AGE RELATED CATARACT; Both Eyes (H25.813) 2.  VITREOUS DETACHMENT PVD; Left Eye [phone])  Plan: 1.  Cataract accounts for the patient's decreased vision. This visual impairment is not correctable with a tolerable change in glasses or contact lenses. Cataract surgery with an implantation  of a new lens should significantly improve the visual and functional status of the patient. Discussed all risks, benefits, alternatives, and potential complications. Discussed the procedures and recovery. Patient desires to have surgery. A-scan ordered and performed today for intra-ocular lens calculations. The surgery will be performed in order to improve vision for driving, reading, and for eye examinations. Recommend phacoemulsification with intra-ocular lens. Right Eye worse - first. Dilates well - shugarcaine by protocol. 2.  Old Asymptomatic. RD precautions given. Patient to call with increase in flashing lights/floaters/dark curtain.

## 2019-12-23 DIAGNOSIS — H25811 Combined forms of age-related cataract, right eye: Secondary | ICD-10-CM | POA: Diagnosis not present

## 2019-12-24 DIAGNOSIS — Z79899 Other long term (current) drug therapy: Secondary | ICD-10-CM | POA: Diagnosis not present

## 2019-12-24 DIAGNOSIS — D649 Anemia, unspecified: Secondary | ICD-10-CM | POA: Diagnosis not present

## 2019-12-24 DIAGNOSIS — M0609 Rheumatoid arthritis without rheumatoid factor, multiple sites: Secondary | ICD-10-CM | POA: Diagnosis not present

## 2019-12-24 DIAGNOSIS — K5909 Other constipation: Secondary | ICD-10-CM | POA: Diagnosis not present

## 2019-12-24 DIAGNOSIS — R21 Rash and other nonspecific skin eruption: Secondary | ICD-10-CM | POA: Diagnosis not present

## 2019-12-26 ENCOUNTER — Other Ambulatory Visit (HOSPITAL_COMMUNITY)
Admission: RE | Admit: 2019-12-26 | Discharge: 2019-12-26 | Disposition: A | Discharge status: Home / Self Care | Payer: Medicare Other | Source: Ambulatory Visit | Attending: Ophthalmology | Admitting: Ophthalmology

## 2019-12-26 ENCOUNTER — Encounter (HOSPITAL_COMMUNITY)
Admission: RE | Admit: 2019-12-26 | Discharge: 2019-12-26 | Disposition: A | Discharge status: Home / Self Care | Payer: Medicare Other | Source: Ambulatory Visit | Attending: Ophthalmology | Admitting: Ophthalmology

## 2019-12-26 ENCOUNTER — Other Ambulatory Visit: Payer: Self-pay

## 2019-12-26 DIAGNOSIS — Z01812 Encounter for preprocedural laboratory examination: Secondary | ICD-10-CM | POA: Diagnosis not present

## 2019-12-26 DIAGNOSIS — R809 Proteinuria, unspecified: Secondary | ICD-10-CM | POA: Diagnosis not present

## 2019-12-26 DIAGNOSIS — E1122 Type 2 diabetes mellitus with diabetic chronic kidney disease: Secondary | ICD-10-CM | POA: Diagnosis not present

## 2019-12-26 DIAGNOSIS — E211 Secondary hyperparathyroidism, not elsewhere classified: Secondary | ICD-10-CM | POA: Diagnosis not present

## 2019-12-26 DIAGNOSIS — Z20822 Contact with and (suspected) exposure to covid-19: Secondary | ICD-10-CM | POA: Insufficient documentation

## 2019-12-26 DIAGNOSIS — E1129 Type 2 diabetes mellitus with other diabetic kidney complication: Secondary | ICD-10-CM | POA: Diagnosis not present

## 2019-12-26 DIAGNOSIS — N189 Chronic kidney disease, unspecified: Secondary | ICD-10-CM | POA: Diagnosis not present

## 2019-12-26 LAB — SARS CORONAVIRUS 2 (TAT 6-24 HRS): SARS Coronavirus 2: NEGATIVE

## 2019-12-27 ENCOUNTER — Other Ambulatory Visit (HOSPITAL_COMMUNITY): Payer: Medicare Other

## 2019-12-30 ENCOUNTER — Ambulatory Visit (HOSPITAL_COMMUNITY): Payer: Medicare Other | Admitting: Anesthesiology

## 2019-12-30 ENCOUNTER — Ambulatory Visit (HOSPITAL_COMMUNITY)
Admission: RE | Admit: 2019-12-30 | Discharge: 2019-12-30 | Disposition: A | Discharge status: Home / Self Care | Payer: Medicare Other | Attending: Ophthalmology | Admitting: Ophthalmology

## 2019-12-30 ENCOUNTER — Encounter (HOSPITAL_COMMUNITY)
Admission: RE | Disposition: A | Discharge status: Home / Self Care | Payer: Self-pay | Source: Home / Self Care | Attending: Ophthalmology

## 2019-12-30 ENCOUNTER — Encounter (HOSPITAL_COMMUNITY): Payer: Self-pay | Admitting: Ophthalmology

## 2019-12-30 DIAGNOSIS — H43812 Vitreous degeneration, left eye: Secondary | ICD-10-CM | POA: Insufficient documentation

## 2019-12-30 DIAGNOSIS — M1990 Unspecified osteoarthritis, unspecified site: Secondary | ICD-10-CM | POA: Insufficient documentation

## 2019-12-30 DIAGNOSIS — Z955 Presence of coronary angioplasty implant and graft: Secondary | ICD-10-CM | POA: Diagnosis not present

## 2019-12-30 DIAGNOSIS — Z79899 Other long term (current) drug therapy: Secondary | ICD-10-CM | POA: Diagnosis not present

## 2019-12-30 DIAGNOSIS — H25811 Combined forms of age-related cataract, right eye: Secondary | ICD-10-CM | POA: Insufficient documentation

## 2019-12-30 DIAGNOSIS — Z7982 Long term (current) use of aspirin: Secondary | ICD-10-CM | POA: Diagnosis not present

## 2019-12-30 DIAGNOSIS — I25119 Atherosclerotic heart disease of native coronary artery with unspecified angina pectoris: Secondary | ICD-10-CM | POA: Diagnosis not present

## 2019-12-30 DIAGNOSIS — E1136 Type 2 diabetes mellitus with diabetic cataract: Secondary | ICD-10-CM | POA: Diagnosis not present

## 2019-12-30 DIAGNOSIS — I110 Hypertensive heart disease with heart failure: Secondary | ICD-10-CM | POA: Insufficient documentation

## 2019-12-30 DIAGNOSIS — I2510 Atherosclerotic heart disease of native coronary artery without angina pectoris: Secondary | ICD-10-CM | POA: Diagnosis not present

## 2019-12-30 DIAGNOSIS — I509 Heart failure, unspecified: Secondary | ICD-10-CM | POA: Insufficient documentation

## 2019-12-30 DIAGNOSIS — E11319 Type 2 diabetes mellitus with unspecified diabetic retinopathy without macular edema: Secondary | ICD-10-CM | POA: Insufficient documentation

## 2019-12-30 DIAGNOSIS — I252 Old myocardial infarction: Secondary | ICD-10-CM | POA: Diagnosis not present

## 2019-12-30 DIAGNOSIS — E1151 Type 2 diabetes mellitus with diabetic peripheral angiopathy without gangrene: Secondary | ICD-10-CM | POA: Diagnosis not present

## 2019-12-30 DIAGNOSIS — E119 Type 2 diabetes mellitus without complications: Secondary | ICD-10-CM | POA: Diagnosis not present

## 2019-12-30 HISTORY — PX: CATARACT EXTRACTION W/PHACO: SHX586

## 2019-12-30 LAB — GLUCOSE, CAPILLARY: Glucose-Capillary: 111 mg/dL — ABNORMAL HIGH (ref 70–99)

## 2019-12-30 SURGERY — PHACOEMULSIFICATION, CATARACT, WITH IOL INSERTION
Anesthesia: Monitor Anesthesia Care | Site: Eye | Laterality: Right

## 2019-12-30 MED ORDER — LIDOCAINE HCL (PF) 1 % IJ SOLN
INTRAOCULAR | Status: DC | PRN
  Administered 2019-12-30: 09:00:00 1 mL via OPHTHALMIC

## 2019-12-30 MED ORDER — SODIUM HYALURONATE 23 MG/ML IO SOLN
INTRAOCULAR | Status: DC | PRN
  Administered 2019-12-30: 0.6 mL via INTRAOCULAR

## 2019-12-30 MED ORDER — PHENYLEPHRINE HCL 2.5 % OP SOLN
1.0000 [drp] | OPHTHALMIC | Status: AC | PRN
  Administered 2019-12-30 (×3): 1 [drp] via OPHTHALMIC

## 2019-12-30 MED ORDER — BSS IO SOLN
INTRAOCULAR | Status: DC | PRN
  Administered 2019-12-30: 15 mL via INTRAOCULAR

## 2019-12-30 MED ORDER — PROVISC 10 MG/ML IO SOLN
INTRAOCULAR | Status: DC | PRN
  Administered 2019-12-30: 0.85 mL via INTRAOCULAR

## 2019-12-30 MED ORDER — EPINEPHRINE PF 1 MG/ML IJ SOLN
INTRAMUSCULAR | Status: AC
  Filled 2019-12-30: qty 2

## 2019-12-30 MED ORDER — CYCLOPENTOLATE-PHENYLEPHRINE 0.2-1 % OP SOLN
1.0000 [drp] | OPHTHALMIC | Status: AC | PRN
  Administered 2019-12-30 (×3): 1 [drp] via OPHTHALMIC

## 2019-12-30 MED ORDER — POVIDONE-IODINE 5 % OP SOLN
OPHTHALMIC | Status: DC | PRN
  Administered 2019-12-30: 1 via OPHTHALMIC

## 2019-12-30 MED ORDER — TETRACAINE HCL 0.5 % OP SOLN
1.0000 [drp] | OPHTHALMIC | Status: AC | PRN
  Administered 2019-12-30 (×3): 1 [drp] via OPHTHALMIC

## 2019-12-30 MED ORDER — LIDOCAINE HCL 3.5 % OP GEL
1.0000 "application " | Freq: Once | OPHTHALMIC | Status: AC
  Administered 2019-12-30: 1 via OPHTHALMIC

## 2019-12-30 MED ORDER — EPINEPHRINE PF 1 MG/ML IJ SOLN
INTRAOCULAR | Status: DC | PRN
  Administered 2019-12-30: 09:00:00 500 mL

## 2019-12-30 MED ORDER — NEOMYCIN-POLYMYXIN-DEXAMETH 3.5-10000-0.1 OP SUSP
OPHTHALMIC | Status: DC | PRN
  Administered 2019-12-30: 1 [drp] via OPHTHALMIC

## 2019-12-30 SURGICAL SUPPLY — 12 items

## 2019-12-30 NOTE — Interval H&P Note (Signed)
History and Physical Interval Note: The H and P was reviewed and updated. The patient was examined.  No changes were found after exam.  The surgical eye was marked.  12/30/2019 9:00 AM  Darrell Marshall  has presented today for surgery, with the diagnosis of Nuclear sclerotic cataract - Right eye.  The various methods of treatment have been discussed with the patient and family. After consideration of risks, benefits and other options for treatment, the patient has consented to  Procedure(s) with comments: CATARACT EXTRACTION PHACO AND INTRAOCULAR LENS PLACEMENT (IOC) (Right) - right as a surgical intervention.  The patient's history has been reviewed, patient examined, no change in status, stable for surgery.  I have reviewed the patient's chart and labs.  Questions were answered to the patient's satisfaction.     Baruch Goldmann

## 2019-12-30 NOTE — Transfer of Care (Signed)
Immediate Anesthesia Transfer of Care Note  Patient: Darrell Marshall  Procedure(s) Performed: CATARACT EXTRACTION PHACO AND INTRAOCULAR LENS PLACEMENT (IOC) (Right Eye)  Patient Location: PACU  Anesthesia Type:MAC  Level of Consciousness: awake  Airway & Oxygen Therapy: Patient Spontanous Breathing  Post-op Assessment: Report given to RN  Post vital signs: Reviewed  Last Vitals:  Vitals Value Taken Time  BP    Temp    Pulse    Resp    SpO2      Last Pain:  Vitals:   12/30/19 0842  TempSrc: Oral  PainSc: 0-No pain      Patients Stated Pain Goal: 8 (37/94/32 7614)  Complications: No apparent anesthesia complications

## 2019-12-30 NOTE — Anesthesia Postprocedure Evaluation (Signed)
Anesthesia Post Note  Patient: Darrell Marshall  Procedure(s) Performed: CATARACT EXTRACTION PHACO AND INTRAOCULAR LENS PLACEMENT (IOC) (Right Eye)  Patient location during evaluation: PACU Anesthesia Type: MAC Level of consciousness: awake and alert and oriented Pain management: pain level controlled Vital Signs Assessment: post-procedure vital signs reviewed and stable Respiratory status: spontaneous breathing Cardiovascular status: blood pressure returned to baseline and stable Postop Assessment: no apparent nausea or vomiting Anesthetic complications: no     Last Vitals:  Vitals:   12/30/19 0842  Pulse: 79  Resp: 18  Temp: 36.7 C  SpO2: 97%    Last Pain:  Vitals:   12/30/19 0842  TempSrc: Oral  PainSc: 0-No pain                 Obadiah Dennard

## 2019-12-30 NOTE — Op Note (Signed)
Date of procedure: 12/30/19  Pre-operative diagnosis:  Visually significant combined form age-related cataract, Right Eye (H25.811)  Post-operative diagnosis:  Visually significant combined form age-related cataract, Right Eye (H25.811)  Procedure: Removal of cataract via phacoemulsification and insertion of intra-ocular lens Wynetta Emery and [employer] DCB00  +22.0D into the capsular bag of the Right Eye  Attending surgeon: Gerda Diss. Brie Eppard, MD, MA  Anesthesia: MAC, Topical Akten  Complications: None  Estimated Blood Loss: <31m (minimal)  Specimens: None  Implants: As above  Indications:  Visually significant age-related cataract, Right Eye  Procedure:  The patient was seen and identified in the pre-operative area. The operative eye was identified and dilated.  The operative eye was marked.  Topical anesthesia was administered to the operative eye.     The patient was then to the operative suite and placed in the supine position.  A timeout was performed confirming the patient, procedure to be performed, and all other relevant information.   The patient's face was prepped and draped in the usual fashion for intra-ocular surgery.  A lid speculum was placed into the operative eye and the surgical microscope moved into place and focused.  A superotemporal paracentesis was created using a 20 gauge paracentesis blade.  Shugarcaine was injected into the anterior chamber.  Viscoelastic was injected into the anterior chamber.  A temporal clear-corneal main wound incision was created using a 2.466mmicrokeratome.  A continuous curvilinear capsulorrhexis was initiated using an irrigating cystitome and completed using capsulorrhexis forceps.  Hydrodissection and hydrodeliniation were performed.  Viscoelastic was injected into the anterior chamber.  A phacoemulsification handpiece and a chopper as a second instrument were used to remove the nucleus and epinucleus. The irrigation/aspiration handpiece was  used to remove any remaining cortical material.   The capsular bag was reinflated with viscoelastic, checked, and found to be intact.  The intraocular lens was inserted into the capsular bag.  The irrigation/aspiration handpiece was used to remove any remaining viscoelastic.  The clear corneal wound and paracentesis wounds were then hydrated and checked with Weck-Cels to be watertight.  The lid-speculum and drape was removed, and the patient's face was cleaned with a wet and dry 4x4.  Maxitrol was instilled in the eye before a clear shield was taped over the eye. The patient was taken to the post-operative care unit in good condition, having tolerated the procedure well.  Post-Op Instructions: [facility] and will receive all other orders and instructions.

## 2019-12-30 NOTE — Anesthesia Preprocedure Evaluation (Signed)
Anesthesia Evaluation  Patient identified by MRN, date of birth, ID band Patient awake    Reviewed: Allergy & Precautions, NPO status , Patient's Chart, lab work & pertinent test results, reviewed documented beta blocker date and time   History of Anesthesia Complications Negative for: history of anesthetic complications  Airway Mallampati: II  TM Distance: >3 FB Neck ROM: Full    Dental  (+) Lower Dentures, Upper Dentures   Pulmonary neg pulmonary ROS,    Pulmonary exam normal breath sounds clear to auscultation       Cardiovascular Exercise Tolerance: Good hypertension, Pt. on medications and Pt. on home beta blockers + angina + CAD, + Past MI, + Cardiac Stents and +CHF   Rhythm:Regular Rate:Normal  Study Conclusions   - Left ventricle: The cavity size was normal. Wall thickness was  increased in a pattern of moderate LVH. Systolic function was  normal. The estimated ejection fraction was in the range of 50%  to 55%. Doppler parameters are consistent with abnormal left  ventricular relaxation (grade 1 diastolic dysfunction).  - Aortic valve: Mildly calcified annulus. Trileaflet; mildly  thickened leaflets. Valve area (VTI): 2.26 cm^2. Valve area  (Vmax): 2.1 cm^2. Valve area (Vmean): 2.01 cm^2.  - Technically adequate study.    Neuro/Psych  Neuromuscular disease negative psych ROS   GI/Hepatic negative GI ROS, Neg liver ROS,   Endo/Other  diabetes (fsbs - 111), Well Controlled, Type 2, Oral Hypoglycemic Agents  Renal/GU Renal InsufficiencyRenal disease     Musculoskeletal  (+) Arthritis , Osteoarthritis,    Abdominal   Peds  Hematology   Anesthesia Other Findings   Reproductive/Obstetrics negative OB ROS                             Anesthesia Physical Anesthesia Plan  ASA: III  Anesthesia Plan: MAC   Post-op Pain Management:    Induction:   PONV Risk Score and  Plan:   Airway Management Planned: Nasal Cannula and Natural Airway  Additional Equipment:   Intra-op Plan:   Post-operative Plan:   Informed Consent: I have reviewed the patients History and Physical, chart, labs and discussed the procedure including the risks, benefits and alternatives for the proposed anesthesia with the patient or authorized representative who has indicated his/her understanding and acceptance.     Dental advisory given  Plan Discussed with: CRNA  Anesthesia Plan Comments:         Anesthesia Quick Evaluation

## 2019-12-30 NOTE — Discharge Instructions (Signed)
Please discharge patient when stable, will follow up today with Dr. Larren Copes at the [facility] [place] office immediately following discharge.  Leave shield in place until visit.  All paperwork with discharge instructions will be given at the office.  [facility] Address:  [address]  [place], [place] [address]  

## 2020-01-03 ENCOUNTER — Encounter (HOSPITAL_COMMUNITY): Payer: Self-pay

## 2020-01-03 ENCOUNTER — Other Ambulatory Visit: Payer: Self-pay

## 2020-01-03 ENCOUNTER — Encounter (HOSPITAL_COMMUNITY)
Admission: RE | Admit: 2020-01-03 | Discharge: 2020-01-03 | Disposition: A | Discharge status: Home / Self Care | Payer: Medicare Other | Source: Ambulatory Visit | Attending: Nephrology | Admitting: Nephrology

## 2020-01-03 DIAGNOSIS — D509 Iron deficiency anemia, unspecified: Secondary | ICD-10-CM | POA: Diagnosis not present

## 2020-01-03 DIAGNOSIS — N1832 Chronic kidney disease, stage 3b: Secondary | ICD-10-CM | POA: Diagnosis not present

## 2020-01-03 MED ORDER — SODIUM CHLORIDE 0.9 % IV SOLN
510.0000 mg | Freq: Once | INTRAVENOUS | Status: AC
  Administered 2020-01-03: 10:00:00 510 mg via INTRAVENOUS
  Filled 2020-01-03: qty 17

## 2020-01-03 MED ORDER — SODIUM CHLORIDE 0.9 % IV SOLN: Freq: Once | INTRAVENOUS | Status: AC

## 2020-01-06 DIAGNOSIS — H25812 Combined forms of age-related cataract, left eye: Secondary | ICD-10-CM | POA: Diagnosis not present

## 2020-01-07 NOTE — H&P (Signed)
Surgical History & Physical  Patient Name: Darrell Marshall DOB: 07-30-1935  Surgery: Cataract extraction with intraocular lens implant phacoemulsification; Left Eye  Surgeon: Baruch Goldmann MD Surgery Date:  01/13/2020 Pre-Op Date:  01/06/2020  HPI: A 89 Yr. old male patient 1. 1. The patient is returning after cataract surgery. The right eye is affected. Status post cataract surgery on 12-30-2019: Since the last visit, the affected area feels improvement and is doing well. The patient's vision is improved. Patient is following medication instructions with 3 in 1 drop BID as of today. Patient doing very well. VA is great OD. OS extremely blurred c/w OD. OS has constant fog covering [place]. This is negatively affecting the patient's quality of life. Patient ready to proceed with OS due to difference between OU and clarity difference. HPI Completed by Dr. Baruch Goldmann  Medical History: Cataracts Diabetes Heart Problem High Blood Pressure LDL  Review of Systems Negative Allergic/Immunologic Negative Cardiovascular Negative Constitutional Negative Ear, Nose, Mouth & Throat Negative Endocrine Negative Eyes Negative Gastrointestinal Negative Genitourinary Negative Hemotologic/Lymphatic Negative Integumentary Negative Musculoskeletal Negative Neurological Negative Psychiatry Negative Respiratory  Social   Never smoked   Medication Prednisolone-gatiflox-bromfenac,  Famotidine, Aspirin, Glimepiride, Atorvastatin, Metoprolol, Leflunomide, Multivitamin, Vitamin B-12,   Sx/Procedures Phaco c IOL OD,  Appendectomy, Cardiac Stents,   Drug Allergies   NKDA  History & Physical: Heent:  Cataract, Left eye NECK: supple without bruits LUNGS: lungs clear to auscultation CV: regular rate and rhythm Abdomen: soft and non-tender  Impression & Plan: Assessment: 1.  COMBINED FORMS AGE RELATED CATARACT; Left Eye (H25.812) 2.  CATARACT EXTRACTION STATUS; Right Eye (Z98.41)  Plan: 1.   Cataract accounts for the patient's decreased vision. This visual impairment is not correctable with a tolerable change in glasses or contact lenses. Cataract surgery with an implantation of a new lens should significantly improve the visual and functional status of the patient. Discussed all risks, benefits, alternatives, and potential complications. Discussed the procedures and recovery. Patient desires to have surgery. A-scan ordered and performed today for intra-ocular lens calculations. The surgery will be performed in order to improve vision for driving, reading, and for eye examinations. Recommend phacoemulsification with intra-ocular lens. Left Eye. Surgery required to correct imbalance of vision. Dilates well - shugarcaine by protocol. 2.  1 week after cataract surgery. Doing well with improved vision and normal eye pressure. Call with any problems or concerns. Continue Gati-Brom-Pred 2x/day for 3 more weeks.

## 2020-01-09 ENCOUNTER — Encounter (HOSPITAL_COMMUNITY)
Admission: RE | Admit: 2020-01-09 | Discharge: 2020-01-09 | Disposition: A | Discharge status: Home / Self Care | Payer: Medicare Other | Source: Ambulatory Visit | Attending: Ophthalmology | Admitting: Ophthalmology

## 2020-01-09 ENCOUNTER — Encounter (HOSPITAL_COMMUNITY): Payer: Self-pay

## 2020-01-10 ENCOUNTER — Other Ambulatory Visit: Payer: Self-pay

## 2020-01-10 ENCOUNTER — Encounter (HOSPITAL_COMMUNITY)
Admission: RE | Admit: 2020-01-10 | Discharge: 2020-01-10 | Disposition: A | Discharge status: Home / Self Care | Payer: Medicare Other | Source: Ambulatory Visit | Attending: Nephrology | Admitting: Nephrology

## 2020-01-10 ENCOUNTER — Encounter (HOSPITAL_COMMUNITY): Payer: Self-pay

## 2020-01-10 ENCOUNTER — Other Ambulatory Visit (HOSPITAL_COMMUNITY)
Admission: RE | Admit: 2020-01-10 | Discharge: 2020-01-10 | Disposition: A | Discharge status: Home / Self Care | Payer: Medicare Other | Source: Ambulatory Visit | Attending: Ophthalmology | Admitting: Ophthalmology

## 2020-01-10 DIAGNOSIS — Z20822 Contact with and (suspected) exposure to covid-19: Secondary | ICD-10-CM | POA: Insufficient documentation

## 2020-01-10 DIAGNOSIS — Z01812 Encounter for preprocedural laboratory examination: Secondary | ICD-10-CM | POA: Diagnosis not present

## 2020-01-10 DIAGNOSIS — N1832 Chronic kidney disease, stage 3b: Secondary | ICD-10-CM | POA: Diagnosis not present

## 2020-01-10 DIAGNOSIS — D509 Iron deficiency anemia, unspecified: Secondary | ICD-10-CM | POA: Diagnosis not present

## 2020-01-10 LAB — SARS CORONAVIRUS 2 (TAT 6-24 HRS): SARS Coronavirus 2: NEGATIVE

## 2020-01-10 MED ORDER — SODIUM CHLORIDE 0.9 % IV SOLN: Freq: Once | INTRAVENOUS | Status: AC

## 2020-01-10 MED ORDER — SODIUM CHLORIDE 0.9 % IV SOLN
510.0000 mg | Freq: Once | INTRAVENOUS | Status: AC
  Administered 2020-01-10: 11:00:00 510 mg via INTRAVENOUS
  Filled 2020-01-10: qty 17

## 2020-01-10 NOTE — Patient Instructions (Signed)
    DREXEL VELARDO  01/10/2020     @PREFPERIOPPHARMACY @   Your procedure is scheduled on  01/13/2020.  Report to [facility] at  1000  A.M.  Call this number if you have problems the morning of surgery:  [phone]   Remember:  Do not eat or drink after midnight.                       Take these medicines the morning of surgery with A SIP OF WATER  Amlodipine, pepcid,arava, lisinopril, metoprolol.    Do not wear jewelry, make-up or nail polish.  Do not wear lotions, powders, or perfumes. Please wear deodorant and brush your teeth.  Do not shave 48 hours prior to surgery.  Men may shave face and neck.  Do not bring valuables to the hospital.  [facility] is not responsible for any belongings or valuables.  Contacts, dentures or bridgework may not be worn into surgery.  Leave your suitcase in the car.  After surgery it may be brought to your room.  For patients admitted to the hospital, discharge time will be determined by your treatment team.  Patients discharged the day of surgery will not be allowed to drive home.   Name and phone number of your driver:   family Special instructions:  None  Please read over the following fact sheets that you were given. Anesthesia Post-op Instructions and Care and Recovery After Surgery

## 2020-01-13 ENCOUNTER — Ambulatory Visit (HOSPITAL_COMMUNITY)
Admission: RE | Admit: 2020-01-13 | Discharge: 2020-01-13 | Disposition: A | Discharge status: Home / Self Care | Payer: Medicare Other | Attending: Ophthalmology | Admitting: Ophthalmology

## 2020-01-13 ENCOUNTER — Encounter (HOSPITAL_COMMUNITY): Payer: Self-pay | Admitting: Ophthalmology

## 2020-01-13 ENCOUNTER — Other Ambulatory Visit: Payer: Self-pay

## 2020-01-13 ENCOUNTER — Encounter (HOSPITAL_COMMUNITY)
Admission: RE | Disposition: A | Discharge status: Home / Self Care | Payer: Self-pay | Source: Home / Self Care | Attending: Ophthalmology

## 2020-01-13 ENCOUNTER — Ambulatory Visit (HOSPITAL_COMMUNITY): Payer: Medicare Other | Admitting: Anesthesiology

## 2020-01-13 DIAGNOSIS — Z9841 Cataract extraction status, right eye: Secondary | ICD-10-CM | POA: Diagnosis not present

## 2020-01-13 DIAGNOSIS — I25118 Atherosclerotic heart disease of native coronary artery with other forms of angina pectoris: Secondary | ICD-10-CM | POA: Insufficient documentation

## 2020-01-13 DIAGNOSIS — I110 Hypertensive heart disease with heart failure: Secondary | ICD-10-CM | POA: Diagnosis not present

## 2020-01-13 DIAGNOSIS — Z7982 Long term (current) use of aspirin: Secondary | ICD-10-CM | POA: Insufficient documentation

## 2020-01-13 DIAGNOSIS — Z955 Presence of coronary angioplasty implant and graft: Secondary | ICD-10-CM | POA: Insufficient documentation

## 2020-01-13 DIAGNOSIS — I10 Essential (primary) hypertension: Secondary | ICD-10-CM | POA: Diagnosis not present

## 2020-01-13 DIAGNOSIS — Z961 Presence of intraocular lens: Secondary | ICD-10-CM | POA: Insufficient documentation

## 2020-01-13 DIAGNOSIS — H25812 Combined forms of age-related cataract, left eye: Secondary | ICD-10-CM | POA: Diagnosis not present

## 2020-01-13 DIAGNOSIS — Z79899 Other long term (current) drug therapy: Secondary | ICD-10-CM | POA: Insufficient documentation

## 2020-01-13 DIAGNOSIS — E1136 Type 2 diabetes mellitus with diabetic cataract: Secondary | ICD-10-CM | POA: Diagnosis not present

## 2020-01-13 DIAGNOSIS — E7800 Pure hypercholesterolemia, unspecified: Secondary | ICD-10-CM | POA: Insufficient documentation

## 2020-01-13 DIAGNOSIS — I252 Old myocardial infarction: Secondary | ICD-10-CM | POA: Diagnosis not present

## 2020-01-13 DIAGNOSIS — I509 Heart failure, unspecified: Secondary | ICD-10-CM | POA: Diagnosis not present

## 2020-01-13 HISTORY — PX: CATARACT EXTRACTION W/PHACO: SHX586

## 2020-01-13 LAB — GLUCOSE, CAPILLARY: Glucose-Capillary: 147 mg/dL — ABNORMAL HIGH (ref 70–99)

## 2020-01-13 SURGERY — PHACOEMULSIFICATION, CATARACT, WITH IOL INSERTION
Anesthesia: Monitor Anesthesia Care | Site: Eye | Laterality: Left

## 2020-01-13 MED ORDER — PHENYLEPHRINE HCL 2.5 % OP SOLN
1.0000 [drp] | OPHTHALMIC | Status: AC | PRN
  Administered 2020-01-13 (×3): 1 [drp] via OPHTHALMIC

## 2020-01-13 MED ORDER — EPINEPHRINE PF 1 MG/ML IJ SOLN
INTRAOCULAR | Status: DC | PRN
  Administered 2020-01-13: 500 mL

## 2020-01-13 MED ORDER — LIDOCAINE HCL (PF) 1 % IJ SOLN
INTRAOCULAR | Status: DC | PRN
  Administered 2020-01-13: 12:00:00 1 mL via OPHTHALMIC

## 2020-01-13 MED ORDER — TETRACAINE HCL 0.5 % OP SOLN
1.0000 [drp] | OPHTHALMIC | Status: AC | PRN
  Administered 2020-01-13 (×3): 1 [drp] via OPHTHALMIC

## 2020-01-13 MED ORDER — POVIDONE-IODINE 5 % OP SOLN
OPHTHALMIC | Status: DC | PRN
  Administered 2020-01-13: 1 via OPHTHALMIC

## 2020-01-13 MED ORDER — LIDOCAINE HCL 3.5 % OP GEL
1.0000 "application " | Freq: Once | OPHTHALMIC | Status: AC
  Administered 2020-01-13: 1 via OPHTHALMIC

## 2020-01-13 MED ORDER — CYCLOPENTOLATE-PHENYLEPHRINE 0.2-1 % OP SOLN
1.0000 [drp] | OPHTHALMIC | Status: AC | PRN
  Administered 2020-01-13 (×3): 1 [drp] via OPHTHALMIC

## 2020-01-13 MED ORDER — PROVISC 10 MG/ML IO SOLN
INTRAOCULAR | Status: DC | PRN
  Administered 2020-01-13: 0.85 mL via INTRAOCULAR

## 2020-01-13 MED ORDER — MIDAZOLAM HCL 2 MG/2ML IJ SOLN
INTRAMUSCULAR | Status: AC
  Filled 2020-01-13: qty 2

## 2020-01-13 MED ORDER — NEOMYCIN-POLYMYXIN-DEXAMETH 3.5-10000-0.1 OP SUSP
OPHTHALMIC | Status: DC | PRN
  Administered 2020-01-13: 1 [drp] via OPHTHALMIC

## 2020-01-13 MED ORDER — LACTATED RINGERS IV SOLN: INTRAVENOUS | Status: DC

## 2020-01-13 MED ORDER — BSS IO SOLN
INTRAOCULAR | Status: DC | PRN
  Administered 2020-01-13: 15 mL via INTRAOCULAR

## 2020-01-13 MED ORDER — SODIUM HYALURONATE 23 MG/ML IO SOLN
INTRAOCULAR | Status: DC | PRN
  Administered 2020-01-13: 0.6 mL via INTRAOCULAR

## 2020-01-13 SURGICAL SUPPLY — 12 items

## 2020-01-13 NOTE — Op Note (Signed)
Date of procedure: 01/13/20  Pre-operative diagnosis: Visually significant age-related combined cataract, Left Eye (H25.812)  Post-operative diagnosis: Visually significant age-related combined cataract, Left Eye (H25.812)  Procedure: Removal of cataract via phacoemulsification and insertion of intra-ocular lens Johnson [employer]  +22.0D into the capsular bag of the Left Eye  Attending surgeon: Gerda Diss. Tucker Minter, MD, MA  Anesthesia: MAC, Topical Akten  Complications: None  Estimated Blood Loss: <15m (minimal)  Specimens: None  Implants: As above  Indications:  Visually significant age-related cataract, Left Eye  Procedure:  The patient was seen and identified in the pre-operative area. The operative eye was identified and dilated.  The operative eye was marked.  Topical anesthesia was administered to the operative eye.     The patient was then to the operative suite and placed in the supine position.  A timeout was performed confirming the patient, procedure to be performed, and all other relevant information.   The patient's face was prepped and draped in the usual fashion for intra-ocular surgery.  A lid speculum was placed into the operative eye and the surgical microscope moved into place and focused.  An inferotemporal paracentesis was created using a 20 gauge paracentesis blade.  Shugarcaine was injected into the anterior chamber.  Viscoelastic was injected into the anterior chamber.  A temporal clear-corneal main wound incision was created using a 2.439mmicrokeratome.  A continuous curvilinear capsulorrhexis was initiated using an irrigating cystitome and completed using capsulorrhexis forceps.  Hydrodissection and hydrodeliniation were performed.  Viscoelastic was injected into the anterior chamber.  A phacoemulsification handpiece and a chopper as a second instrument were used to remove the nucleus and epinucleus. The irrigation/aspiration handpiece was used to remove  any remaining cortical material.   The capsular bag was reinflated with viscoelastic, checked, and found to be intact.  The intraocular lens was inserted into the capsular bag.  The irrigation/aspiration handpiece was used to remove any remaining viscoelastic.  The clear corneal wound and paracentesis wounds were then hydrated and checked with Weck-Cels to be watertight.  The lid-speculum and drape was removed, and the patient's face was cleaned with a wet and dry 4x4.  Maxitrol was instilled in the eye before a clear shield was taped over the eye. The patient was taken to the post-operative care unit in good condition, having tolerated the procedure well.  Post-Op Instructions: [facility] and will receive all other orders and instructions.

## 2020-01-13 NOTE — Transfer of Care (Signed)
Immediate Anesthesia Transfer of Care Note  Patient: Darrell Marshall  Procedure(s) Performed: CATARACT EXTRACTION PHACO AND INTRAOCULAR LENS PLACEMENT (IOC) (Left Eye)  Patient Location: PACU  Anesthesia Type:MAC  Level of Consciousness: awake, alert  and oriented  Airway & Oxygen Therapy: Patient Spontanous Breathing  Post-op Assessment: Report given to RN, Post -op Vital signs reviewed and stable and Patient moving all extremities X 4  Post vital signs: Reviewed and stable  Last Vitals:  Vitals Value Taken Time  BP    Temp    Pulse    Resp    SpO2      Last Pain:  Vitals:   01/13/20 1038  TempSrc: Oral  PainSc: 0-No pain         Complications: No apparent anesthesia complications

## 2020-01-13 NOTE — Discharge Instructions (Signed)
Please discharge patient when stable, will follow up today with Dr. Marisa Hua at the [facility] office immediately following discharge.  Leave shield in place until visit.  All paperwork with discharge instructions will be given at the office.  [facility] Address:  [place], [place] [address]   Moderate Conscious Sedation, Adult Sedation is the use of medicines to promote relaxation and relieve discomfort and anxiety. Moderate conscious sedation is a type of sedation. Under moderate conscious sedation, you are less alert than normal, but you are still able to respond to instructions, touch, or both. Moderate conscious sedation is used during short medical and dental procedures. It is milder than deep sedation, which is a type of sedation under which you cannot be easily woken up. It is also milder than general anesthesia, which is the use of medicines to make you unconscious. Moderate conscious sedation allows you to return to your regular activities sooner. Tell a health care provider about:  Any allergies you have.  All medicines you are taking, including vitamins, herbs, eye drops, creams, and over-the-counter medicines.  Use of steroids (by mouth or creams).  Any problems you or family members have had with sedatives and anesthetic medicines.  Any blood disorders you have.  Any surgeries you have had.  Any medical conditions you have, such as sleep apnea.  Whether you are pregnant or may be pregnant.  Any use of cigarettes, alcohol, marijuana, or street drugs. What are the risks? Generally, this is a safe procedure. However, problems may occur, including:  Getting too much medicine (oversedation).  Nausea.  Allergic reaction to medicines.  Trouble breathing. If this happens, a breathing tube may be used to help with breathing. It will be removed when you are awake and breathing on your own.  Heart trouble.  Lung  trouble. What happens before the procedure? Staying hydrated Follow instructions from your health care provider about hydration, which may include:  Up to 2 hours before the procedure - you may continue to drink clear liquids, such as water, clear fruit juice, black coffee, and plain tea. Eating and drinking restrictions Follow instructions from your health care provider about eating and drinking, which may include:  8 hours before the procedure - stop eating heavy meals or foods such as meat, fried foods, or fatty foods.  6 hours before the procedure - stop eating light meals or foods, such as toast or cereal.  6 hours before the procedure - stop drinking milk or drinks that contain milk.  2 hours before the procedure - stop drinking clear liquids. Medicine Ask your health care provider about:  Changing or stopping your regular medicines. This is especially important if you are taking diabetes medicines or blood thinners.  Taking medicines such as aspirin and ibuprofen. These medicines can thin your blood. Do not take these medicines before your procedure if your health care provider instructs you not to.  Tests and exams  You will have a physical exam.  You may have blood tests done to show: ? How well your kidneys and liver are working. ? How well your blood can clot. General instructions  Plan to have someone take you home from the hospital or clinic.  If you will be going home right after the procedure, plan to have someone with you for 24 hours. What happens during the procedure?  An IV tube will be inserted into one of your veins.  Medicine to help you relax (  sedative) will be given through the IV tube.  The medical or dental procedure will be performed. What happens after the procedure?  Your blood pressure, heart rate, breathing rate, and blood oxygen level will be monitored often until the medicines you were given have worn off.  Do not drive for 24  hours. This information is not intended to replace advice given to you by your health care provider. Make sure you discuss any questions you have with your health care provider. Document Revised: 10/20/2017 Document Reviewed: 02/27/2016 Elsevier Patient Education  2020 [employer].

## 2020-01-13 NOTE — Anesthesia Postprocedure Evaluation (Signed)
Anesthesia Post Note  Patient: Darrell Marshall  Procedure(s) Performed: CATARACT EXTRACTION PHACO AND INTRAOCULAR LENS PLACEMENT (IOC) (Left Eye)  Patient location during evaluation: PACU Anesthesia Type: MAC Level of consciousness: awake and alert Pain management: pain level controlled Vital Signs Assessment: post-procedure vital signs reviewed and stable Respiratory status: spontaneous breathing, nonlabored ventilation, respiratory function stable and patient connected to nasal cannula oxygen Cardiovascular status: stable and blood pressure returned to baseline Postop Assessment: no apparent nausea or vomiting Anesthetic complications: no     Last Vitals:  Vitals:   01/13/20 1038 01/13/20 1239  BP: (!) 147/75 (!) 141/83  Pulse: 66 71  Resp: 17 16  Temp: 36.6 C   SpO2: 96% 98%    Last Pain:  Vitals:   01/13/20 1239  TempSrc: Oral  PainSc:                  Talitha Givens

## 2020-01-13 NOTE — Interval H&P Note (Signed)
History and Physical Interval Note: The H and P was reviewed and updated. The patient was examined.  No changes were found after exam.  The surgical eye was marked.  01/13/2020 12:12 PM  Darrell Marshall  has presented today for surgery, with the diagnosis of Nuclear sclerotic cataract - Left eye.  The various methods of treatment have been discussed with the patient and family. After consideration of risks, benefits and other options for treatment, the patient has consented to  Procedure(s) with comments: CATARACT EXTRACTION PHACO AND INTRAOCULAR LENS PLACEMENT ([place]) (Left) - left as a surgical intervention.  The patient's history has been reviewed, patient examined, no change in status, stable for surgery.  I have reviewed the patient's chart and labs.  Questions were answered to the patient's satisfaction.     Baruch Goldmann

## 2020-01-13 NOTE — Anesthesia Preprocedure Evaluation (Signed)
Anesthesia Evaluation  Patient identified by MRN, date of birth, ID band Patient awake    Reviewed: Allergy & Precautions, NPO status , Patient's Chart, lab work & pertinent test results, reviewed documented beta blocker date and time   Airway Mallampati: II  TM Distance: >3 FB Neck ROM: Full    Dental no notable dental hx. (+) Teeth Intact   Pulmonary neg pulmonary ROS,    Pulmonary exam normal breath sounds clear to auscultation       Cardiovascular Exercise Tolerance: Good hypertension, Pt. on home beta blockers and Pt. on medications + angina with exertion + CAD, + Past MI, + Cardiac Stents and +CHF  Normal cardiovascular examI Rhythm:Regular Rate:Normal  Denies recent CP/SL NTG use  States uses stationary bike without trouble   Neuro/Psych  Neuromuscular disease negative psych ROS   GI/Hepatic negative GI ROS, Neg liver ROS,   Endo/Other  negative endocrine ROSdiabetes  Renal/GU negative Renal ROS  negative genitourinary   Musculoskeletal negative musculoskeletal ROS (+)   Abdominal   Peds negative pediatric ROS (+)  Hematology negative hematology ROS (+)   Anesthesia Other Findings   Reproductive/Obstetrics negative OB ROS                             Anesthesia Physical Anesthesia Plan  ASA: III  Anesthesia Plan: MAC   Post-op Pain Management:    Induction: Intravenous  PONV Risk Score and Plan: 1 and TIVA and Treatment may vary due to age or medical condition  Airway Management Planned: Nasal Cannula  Additional Equipment:   Intra-op Plan:   Post-operative Plan:   Informed Consent: I have reviewed the patients History and Physical, chart, labs and discussed the procedure including the risks, benefits and alternatives for the proposed anesthesia with the patient or authorized representative who has indicated his/her understanding and acceptance.     Dental advisory  given  Plan Discussed with: CRNA  Anesthesia Plan Comments: (Plan Full PPE use  Plan MAC d/w pt -WTP with same after Q&A  S/p recent IOL- without trouble - wants similar to 12/30/19 IOL)        Anesthesia Quick Evaluation

## 2020-01-14 NOTE — Addendum Note (Signed)
Addendum  created 01/14/20 0754 by Vista Deck, CRNA   Charge Capture section accepted

## 2020-01-16 DIAGNOSIS — E1129 Type 2 diabetes mellitus with other diabetic kidney complication: Secondary | ICD-10-CM | POA: Diagnosis not present

## 2020-01-16 DIAGNOSIS — D638 Anemia in other chronic diseases classified elsewhere: Secondary | ICD-10-CM | POA: Diagnosis not present

## 2020-01-16 DIAGNOSIS — N189 Chronic kidney disease, unspecified: Secondary | ICD-10-CM | POA: Diagnosis not present

## 2020-01-16 DIAGNOSIS — E211 Secondary hyperparathyroidism, not elsewhere classified: Secondary | ICD-10-CM | POA: Diagnosis not present

## 2020-01-16 DIAGNOSIS — E1122 Type 2 diabetes mellitus with diabetic chronic kidney disease: Secondary | ICD-10-CM | POA: Diagnosis not present

## 2020-01-24 DIAGNOSIS — N189 Chronic kidney disease, unspecified: Secondary | ICD-10-CM | POA: Diagnosis not present

## 2020-01-24 DIAGNOSIS — D638 Anemia in other chronic diseases classified elsewhere: Secondary | ICD-10-CM | POA: Diagnosis not present

## 2020-01-24 DIAGNOSIS — I5032 Chronic diastolic (congestive) heart failure: Secondary | ICD-10-CM | POA: Diagnosis not present

## 2020-01-24 DIAGNOSIS — E211 Secondary hyperparathyroidism, not elsewhere classified: Secondary | ICD-10-CM | POA: Diagnosis not present

## 2020-01-24 DIAGNOSIS — R809 Proteinuria, unspecified: Secondary | ICD-10-CM | POA: Diagnosis not present

## 2020-03-11 NOTE — Progress Notes (Signed)
Cardiology Office Note    Date:  03/16/2020   ID:  Darrell Marshall, DOB 06-18-1935, MRN [id number]  PCP:  Lucia Gaskins, MD  Cardiologist: Carlyle Dolly, MD EPS: None  No chief complaint on file.   History of Present Illness:  Darrell Marshall is a 85 y.o. male with history of CAD inferior STEMI 12/2015 treated with DES to the RCA, reperfusion complicated by VT received defibrillation, echo 12/2015 LVEF 40 to 45%, echo 11/2016 LVEF 50 to 55% with grade 1 DD, hypertension, HLD, CKD stage IIIb followed by Dr. Theador Hawthorne.  Patient last had a telemedicine visit with Dr. Harl Bowie 07/04/2019 at which time he was doing well no changes made.  Patient comes in today accompanied by his daughter. Rides exercise bike twice a day for 10 min. No chest pain, dyspnea, dizziness or presyncope.  Has received both his Covid vaccines.  Still drives.  Followed closely by kidney specialist and rheumatologist.  Getting new PCP in May.  Past Medical History:  Diagnosis Date  . Atherosclerotic heart disease of native coronary artery with unspecified angina pectoris ([place])   . CAD (coronary artery disease), native coronary artery    cath 01/12/2016 50% ost D1, 60% prox LCx, 100% distal RCA lesion treated with DES  . Carpal tunnel syndrome, bilateral upper limbs   . Chronic systolic (congestive) heart failure ([place])   . CKD (chronic kidney disease), stage III 01/15/2016  . Diabetes mellitus without complication ([place])   . Essential (primary) hypertension   . Hypertension   . Hypertensive chronic kidney disease with stage 1 through stage 4 chronic kidney disease, or unspecified chronic kidney disease   . Ischemic cardiomyopathy    Echo 01/13/2016 EF 40-45%  . Kidney disease, chronic, stage III (moderate, EGFR 30-59 ml/min)   . Obesity, unspecified   . Reflux esophagitis   . Rheumatoid arthritis, unspecified ([place])   . ST elevation myocardial infarction (STEMI) of inferior wall ([place]) 01/12/2016  . Subsequent ST  elevation (STEMI) myocardial infarction of anterior wall ([place])   . Type 2 diabetes mellitus with diabetic nephropathy ([place])   . Type 2 diabetes mellitus with unspecified complications [facility])     Past Surgical History:  Procedure Laterality Date  . APPENDECTOMY    . CARDIAC CATHETERIZATION N/A 01/12/2016   Procedure: Left Heart Cath and Coronary Angiography;  Surgeon: Sherren Mocha, MD;  Location: [place] [facility];  Service: Cardiovascular;  Laterality: N/A;  . CARDIAC CATHETERIZATION N/A 01/12/2016   Procedure: Coronary Stent Intervention;  Surgeon: Sherren Mocha, MD;  Location: [place] [facility];  Service: Cardiovascular;  Laterality: N/A;  . CATARACT EXTRACTION W/PHACO Right 12/30/2019   Procedure: CATARACT EXTRACTION PHACO AND INTRAOCULAR LENS PLACEMENT (IOC);  Surgeon: Baruch Goldmann, MD;  Location: [facility];  Service: Ophthalmology;  Laterality: Right;  CDE: 4.06  . CATARACT EXTRACTION W/PHACO Left 01/13/2020   Procedure: CATARACT EXTRACTION PHACO AND INTRAOCULAR LENS PLACEMENT (IOC);  Surgeon: Baruch Goldmann, MD;  Location: [facility];  Service: Ophthalmology;  Laterality: Left;  CDE: 6.68  . CORONARY STENT PLACEMENT  01/12/16    Current Medications: Current Meds  Medication Sig  . amLODipine (NORVASC) 10 MG tablet Take 1 tablet (10 mg total) by mouth daily.  [name] Kitchen aspirin 81 MG chewable tablet Chew 1 tablet (81 mg total) by mouth daily.  [name] Kitchen atorvastatin (LIPITOR) 80 MG tablet Take 1 tablet (80 mg total) by mouth daily at 6 PM.  . famotidine (PEPCID) 40 MG tablet Take 40 mg by mouth  daily.  . glimepiride (AMARYL) 1 MG tablet Take 1 tablet (1 mg total) by mouth daily with breakfast.  . leflunomide (ARAVA) 20 MG tablet Take 20 mg by mouth daily.  . metoprolol succinate (TOPROL-XL) 25 MG 24 hr tablet Take 25 mg by mouth 2 (two) times daily.   . Multiple Vitamins-Minerals (MULTIVITAMIN ADULTS 50+ PO) Take 1 tablet by mouth daily. Men  . nitroGLYCERIN (NITROSTAT) 0.4 MG SL tablet Place 1 tablet  (0.4 mg total) under the tongue every 5 (five) minutes x 3 doses as needed for chest pain.  [name] Kitchen triamcinolone ointment (KENALOG) 0.5 % Apply 1 application topically 2 (two) times daily. As needed  . vitamin B-12 (CYANOCOBALAMIN) 500 MCG tablet Take 1,000 mcg by mouth daily.      Allergies:   Patient has no known allergies.   Social History   Socioeconomic History  . Marital status: Widowed    Spouse name: Not on file  . Number of children: Not on file  . Years of education: Not on file  . Highest education level: Not on file  Occupational History  . Not on file  Tobacco Use  . Smoking status: Never Smoker  . Smokeless tobacco: Never Used  Substance and Sexual Activity  . Alcohol use: No  . Drug use: No  . Sexual activity: Not on file  Other Topics Concern  . Not on file  Social History Narrative  . Not on file   Social Determinants of Health   Financial Resource Strain:   . Difficulty of Paying Living Expenses:   Food Insecurity:   . Worried About Charity fundraiser in the Last Year:   . Arboriculturist in the Last Year:   Transportation Needs:   . Film/video editor (Medical):   [name] Kitchen Lack of Transportation (Non-Medical):   Physical Activity:   . Days of Exercise per Week:   . Minutes of Exercise per Session:   Stress:   . Feeling of Stress :   Social Connections:   . Frequency of Communication with Friends and Family:   . Frequency of Social Gatherings with Friends and Family:   . Attends Religious Services:   . Active Member of Clubs or Organizations:   . Attends Archivist Meetings:   [name] Kitchen Marital Status:      Family History:  The patient's family history includes Diabetes in his mother; Hypertension in his mother; Ovarian cancer in his mother; Prostate cancer in his father.   ROS:   Please see the history of present illness.    ROS All other systems reviewed and are negative.   PHYSICAL EXAM:   VS:  BP 124/68   Pulse 90   Temp 98.6 F (37 C)  (Temporal)   Ht 5' 8.5" (1.74 m)   Wt 237 lb 6.4 oz (107.7 kg)   SpO2 95%   BMI 35.57 kg/m   Physical Exam  GEN: Obese, in no acute distress  Neck: no JVD, carotid bruits, or masses Cardiac:RRR; no murmurs, rubs, or gallops  Respiratory:  clear to auscultation bilaterally, normal work of breathing GI: soft, nontender, nondistended, + BS Ext: Trace of edema without cyanosis, clubbing,  Good distal pulses bilaterally Neuro:  Alert and Oriented x 3 Psych: euthymic mood, full affect  Wt Readings from Last 3 Encounters:  03/16/20 237 lb 6.4 oz (107.7 kg)  12/30/19 215 lb (97.5 kg)  07/04/19 215 lb (97.5 kg)      Studies/Labs Reviewed:  EKG:  EKG is  ordered today.  The ekg ordered today demonstrates normal sinus rhythm nonspecific ST-T wave changes, no acute change from prior EKG  Recent Labs: 05/06/2019: ALT 21 06/27/2019: Hemoglobin 12.5; Platelets 220 10/07/2019: BUN 21; Creatinine 1.8; Potassium 4.0; Sodium 140   Lipid Panel    Component Value Date/Time   CHOL 99 05/06/2019 0000   TRIG 276 (A) 05/06/2019 0000   HDL 31 (A) 05/06/2019 0000   CHOLHDL 4.8 01/13/2016 0105   VLDL 42 (H) 01/13/2016 0105   LDLCALC 13 05/06/2019 0000    Additional studies/ records that were reviewed today include:  Jan 2018 echo Study Conclusions   - Left ventricle: The cavity size was normal. Wall thickness was   increased in a pattern of moderate LVH. Systolic function was   normal. The estimated ejection fraction was in the range of 50%   to 55%. Doppler parameters are consistent with abnormal left   ventricular relaxation (grade 1 diastolic dysfunction). - Aortic valve: Mildly calcified annulus. Trileaflet; mildly   thickened leaflets. Valve area (VTI): 2.26 cm^2. Valve area   (Vmax): 2.1 cm^2. Valve area (Vmean): 2.01 cm^2. - Technically adequate study.       ASSESSMENT:    1. Coronary artery disease involving native coronary artery of native heart without angina pectoris   2.  Essential hypertension   3. Hyperlipidemia, unspecified hyperlipidemia type   4. Stage 3b chronic kidney disease      PLAN:  In order of problems listed above:  CAD inferior STEMI 12/2015 treated with DES to the RCA, reperfusion complicated by VT received defibrillation, echo 12/2015 LVEF 40 to 45%, echo 11/2016 LVEF 50 to 55% with grade 1 DD-no angina, exercising daily.  Heart rate is in the 90s at rest on metoprolol.  He is asymptomatic.  Blood pressure controlled so will not increase at this time.  Continue aspirin, statin and metoprolol.  Follow-up with Dr. Harl Bowie in 6 months.  Essential hypertension well-controlled  Hyperlipidemia LDL 13 05/06/2019  CKD stage IIIb creatinine 2.0 12/24/2019    Medication Adjustments/Labs and Tests Ordered: Current medicines are reviewed at length with the patient today.  Concerns regarding medicines are outlined above.  Medication changes, Labs and Tests ordered today are listed in the Patient Instructions below. There are no Patient Instructions on file for this visit.   Sumner Boast, PA-C  03/16/2020 11:15 AM    [facility] [address], [place], [place]  [address] Phone: [phone]; Fax: [phone]

## 2020-03-16 ENCOUNTER — Encounter: Payer: Self-pay | Admitting: Physician Assistant

## 2020-03-16 ENCOUNTER — Ambulatory Visit (INDEPENDENT_AMBULATORY_CARE_PROVIDER_SITE_OTHER): Payer: Medicare Other | Admitting: Physician Assistant

## 2020-03-16 VITALS — BP 124/68 | HR 90 | Temp 98.6°F | Ht 68.5 in | Wt 237.4 lb

## 2020-03-16 DIAGNOSIS — I2510 Atherosclerotic heart disease of native coronary artery without angina pectoris: Secondary | ICD-10-CM

## 2020-03-16 DIAGNOSIS — N1832 Chronic kidney disease, stage 3b: Secondary | ICD-10-CM | POA: Diagnosis not present

## 2020-03-16 DIAGNOSIS — E785 Hyperlipidemia, unspecified: Secondary | ICD-10-CM | POA: Diagnosis not present

## 2020-03-16 DIAGNOSIS — I10 Essential (primary) hypertension: Secondary | ICD-10-CM | POA: Diagnosis not present

## 2020-03-16 NOTE — Patient Instructions (Addendum)
Medication Instructions:  Your physician recommends that you continue on your current medications as directed. Please refer to the Current Medication list given to you today.  *If you need a refill on your cardiac medications before your next appointment, please call your pharmacy*   Lab Work: None If you have labs (blood work) drawn today and your tests are completely normal, you will receive your results only by: [name] Kitchen MyChart Message (if you have MyChart) OR . A paper copy in the mail If you have any lab test that is abnormal or we need to change your treatment, we will call you to review the results.   Testing/Procedures: None   Follow-Up: At [facility], you and your health needs are our priority.  As part of our continuing mission to provide you with exceptional heart care, we have created designated Provider Care Teams.  These Care Teams include your primary Cardiologist (physician) and Advanced Practice Providers (APPs -  Physician Assistants and Nurse Practitioners) who all work together to provide you with the care you need, when you need it.  We recommend signing up for the patient portal called "MyChart".  Sign up information is provided on this After Visit Summary.  MyChart is used to connect with patients for Virtual Visits (Telemedicine).  Patients are able to view lab/test results, encounter notes, upcoming appointments, etc.  Non-urgent messages can be sent to your provider as well.   To learn more about what you can do with MyChart, go to [email or web address].    Your next appointment:   6 month(s)  The format for your next appointment:   In Person  Provider:   Carlyle Dolly, MD   Other Instructions  Your provider recommends that you maintain 150 minutes per week of moderate aerobic activity.

## 2020-04-29 ENCOUNTER — Other Ambulatory Visit: Payer: Self-pay | Admitting: Nephrology

## 2020-04-29 ENCOUNTER — Other Ambulatory Visit: Payer: Self-pay

## 2020-04-29 ENCOUNTER — Ambulatory Visit (HOSPITAL_COMMUNITY)
Admission: RE | Admit: 2020-04-29 | Discharge: 2020-04-29 | Disposition: A | Discharge status: Home / Self Care | Payer: Medicare Other | Source: Ambulatory Visit | Attending: Nephrology | Admitting: Nephrology

## 2020-04-29 DIAGNOSIS — R600 Localized edema: Secondary | ICD-10-CM | POA: Diagnosis not present

## 2020-05-04 ENCOUNTER — Other Ambulatory Visit (HOSPITAL_COMMUNITY): Payer: Self-pay | Admitting: Nephrology

## 2020-05-04 DIAGNOSIS — R600 Localized edema: Secondary | ICD-10-CM

## 2020-05-13 ENCOUNTER — Other Ambulatory Visit: Payer: Self-pay

## 2020-05-13 ENCOUNTER — Ambulatory Visit (HOSPITAL_COMMUNITY)
Admission: RE | Admit: 2020-05-13 | Discharge: 2020-05-13 | Disposition: A | Discharge status: Home / Self Care | Payer: Medicare Other | Source: Ambulatory Visit | Attending: Nephrology | Admitting: Nephrology

## 2020-05-13 DIAGNOSIS — R600 Localized edema: Secondary | ICD-10-CM | POA: Insufficient documentation

## 2020-05-13 NOTE — Progress Notes (Signed)
*  PRELIMINARY RESULTS* Echocardiogram 2D Echocardiogram has been performed.  Darrell Marshall 05/13/2020, 2:39 PM

## 2020-05-14 ENCOUNTER — Ambulatory Visit: Payer: Medicare Other | Admitting: Cardiology

## 2020-09-09 NOTE — Progress Notes (Signed)
Cardiology Office Note    Date:  09/15/2020   ID:  Darrell Marshall, DOB 1935-07-30, MRN [id number]  PCP:  Lucia Gaskins, MD  Cardiologist: Carlyle Dolly, MD EPS: None  Chief Complaint  Patient presents with  . Follow-up    History of Present Illness:  Darrell Marshall is a 85 y.o. male with history of CAD inferior STEMI 12/2015 treated with DES to the RCA, reperfusion complicated by VT received defibrillation, echo 12/2015 LVEF 40 to 45%, echo 11/2016 LVEF 50 to 55% with grade 1 DD, hypertension, HLD, CKD stage IIIb followed by Dr. Theador Hawthorne.     I saw the patient 03/16/2020 at which time he was doing well. Followed closely by kidney specialist and rheumatologist.    Renal saw him in June and patient had a lot of swelling. He stopped amlodipine and started lasix 40 mg bid.  Follow-up echo 05/13/2020 normal LVEF 50 to 55% moderate LVH, grade 1 DD.   Patient comes in for f/u. Denies chest pain,dyspnea, dizziness, palpitations, edema or cardiac complaints.BP up today. Took his meds this am. BP was ok 2 weeks ago at PCP.  Tries to watch his salt. Has cut back on lasix 40 mg once daily because of urinary frequency.  Crt 1.85 yest. Does his own lawn work, raking, mowing. Exercise bike broke but he's going to get a new one.       Past Medical History:  Diagnosis Date  . Atherosclerotic heart disease of native coronary artery with unspecified angina pectoris ([place])   . CAD (coronary artery disease), native coronary artery    cath 01/12/2016 50% ost D1, 60% prox LCx, 100% distal RCA lesion treated with DES  . Carpal tunnel syndrome, bilateral upper limbs   . Chronic systolic (congestive) heart failure ([place])   . CKD (chronic kidney disease), stage III ([place]) 01/15/2016  . Diabetes mellitus without complication ([place])   . Essential (primary) hypertension   . Hypertension   . Hypertensive chronic kidney disease with stage 1 through stage 4 chronic kidney disease, or unspecified chronic kidney  disease   . Ischemic cardiomyopathy    Echo 01/13/2016 EF 40-45%  . Kidney disease, chronic, stage III (moderate, EGFR 30-59 ml/min) ([facility])   . Obesity, unspecified   . Reflux esophagitis   . Rheumatoid arthritis, unspecified ([place])   . ST elevation myocardial infarction (STEMI) of inferior wall ([place]) 01/12/2016  . Subsequent ST elevation (STEMI) myocardial infarction of anterior wall ([place])   . Type 2 diabetes mellitus with diabetic nephropathy ([place])   . Type 2 diabetes mellitus with unspecified complications [facility])     Past Surgical History:  Procedure Laterality Date  . APPENDECTOMY    . CARDIAC CATHETERIZATION N/A 01/12/2016   Procedure: Left Heart Cath and Coronary Angiography;  Surgeon: Sherren Mocha, MD;  Location: [place] CV LAB;  Service: Cardiovascular;  Laterality: N/A;  . CARDIAC CATHETERIZATION N/A 01/12/2016   Procedure: Coronary Stent Intervention;  Surgeon: Sherren Mocha, MD;  Location: [place] CV LAB;  Service: Cardiovascular;  Laterality: N/A;  . CATARACT EXTRACTION W/PHACO Right 12/30/2019   Procedure: CATARACT EXTRACTION PHACO AND INTRAOCULAR LENS PLACEMENT (IOC);  Surgeon: Baruch Goldmann, MD;  Location: [facility];  Service: Ophthalmology;  Laterality: Right;  CDE: 4.06  . CATARACT EXTRACTION W/PHACO Left 01/13/2020   Procedure: CATARACT EXTRACTION PHACO AND INTRAOCULAR LENS PLACEMENT (IOC);  Surgeon: Baruch Goldmann, MD;  Location: [facility];  Service: Ophthalmology;  Laterality: Left;  CDE: 6.68  . CORONARY STENT PLACEMENT  01/12/16    Current Medications: Current Meds  Medication Sig  . aspirin 81 MG chewable tablet Chew 1 tablet (81 mg total) by mouth daily.  [name] Kitchen atorvastatin (LIPITOR) 80 MG tablet Take 1 tablet (80 mg total) by mouth daily at 6 PM.  . famotidine (PEPCID) 40 MG tablet Take 40 mg by mouth daily.  . furosemide (LASIX) 40 MG tablet Take 40 mg by mouth daily.  [name] Kitchen glimepiride (AMARYL) 1 MG tablet Take 1 tablet (1 mg total) by mouth daily with breakfast.  .  hydrALAZINE (APRESOLINE) 50 MG tablet Take 50 mg by mouth 2 (two) times daily.  [name] Kitchen LANTUS SOLOSTAR 100 UNIT/ML Solostar Pen Inject 10 Units into the skin at bedtime.  [name] Kitchen leflunomide (ARAVA) 20 MG tablet Take 20 mg by mouth daily.  . metoprolol succinate (TOPROL-XL) 25 MG 24 hr tablet Take 25 mg by mouth 2 (two) times daily.   . Multiple Vitamins-Minerals (MULTIVITAMIN ADULTS 50+ PO) Take 1 tablet by mouth daily. Men  . nitroGLYCERIN (NITROSTAT) 0.4 MG SL tablet Place 1 tablet (0.4 mg total) under the tongue every 5 (five) minutes x 3 doses as needed for chest pain.  [name] Kitchen triamcinolone ointment (KENALOG) 0.5 % Apply 1 application topically 2 (two) times daily. As needed  . vitamin B-12 (CYANOCOBALAMIN) 500 MCG tablet Take 1,000 mcg by mouth daily.   . [DISCONTINUED] amLODipine (NORVASC) 10 MG tablet Take 1 tablet (10 mg total) by mouth daily.     Allergies:   Patient has no known allergies.   Social History   Socioeconomic History  . Marital status: Widowed    Spouse name: Not on file  . Number of children: Not on file  . Years of education: Not on file  . Highest education level: Not on file  Occupational History  . Not on file  Tobacco Use  . Smoking status: Never Smoker  . Smokeless tobacco: Never Used  Vaping Use  . Vaping Use: Never used  Substance and Sexual Activity  . Alcohol use: No  . Drug use: No  . Sexual activity: Not on file  Other Topics Concern  . Not on file  Social History Narrative  . Not on file   Social Determinants of Health   Financial Resource Strain:   . Difficulty of Paying Living Expenses: Not on file  Food Insecurity:   . Worried About Programme researcher, broadcasting/film/video in the Last Year: Not on file  . Ran Out of Food in the Last Year: Not on file  Transportation Needs:   . Lack of Transportation (Medical): Not on file  . Lack of Transportation (Non-Medical): Not on file  Physical Activity:   . Days of Exercise per Week: Not on file  . Minutes of Exercise per  Session: Not on file  Stress:   . Feeling of Stress : Not on file  Social Connections:   . Frequency of Communication with Friends and Family: Not on file  . Frequency of Social Gatherings with Friends and Family: Not on file  . Attends Religious Services: Not on file  . Active Member of Clubs or Organizations: Not on file  . Attends Banker Meetings: Not on file  . Marital Status: Not on file     Family History:  The patient's   family history includes Diabetes in his mother; Hypertension in his mother; Ovarian cancer in his mother; Prostate cancer in his father.   ROS:   Please see the history of present illness.  ROS All other systems reviewed and are negative.   PHYSICAL EXAM:   VS:  BP (!) 158/88   Pulse 85   Ht 5' 8.5" (1.74 m)   Wt 226 lb (102.5 kg)   SpO2 95%   BMI 33.86 kg/m   Initial BP 190/90 Physical Exam  GEN: Well nourished, well developed, in no acute distress  Neck: no JVD, carotid bruits, or masses Cardiac:RRR; no murmurs, rubs, or gallops  Respiratory:  clear to auscultation bilaterally, normal work of breathing GI: soft, nontender, nondistended, + BS Ext: without cyanosis, clubbing, or edema, Good distal pulses bilaterally Neuro:  Alert and Oriented x 3 Psych: euthymic mood, full affect  Wt Readings from Last 3 Encounters:  09/15/20 226 lb (102.5 kg)  03/16/20 237 lb 6.4 oz (107.7 kg)  12/30/19 215 lb (97.5 kg)      Studies/Labs Reviewed:   EKG:  EKG is not ordered today.   Recent Labs: 10/07/2019: BUN 21; Creatinine 1.8; Potassium 4.0; Sodium 140   Lipid Panel    Component Value Date/Time   CHOL 99 05/06/2019 0000   TRIG 276 (A) 05/06/2019 0000   HDL 31 (A) 05/06/2019 0000   CHOLHDL 4.8 01/13/2016 0105   VLDL 42 (H) 01/13/2016 0105   LDLCALC 13 05/06/2019 0000    Additional studies/ records that were reviewed today include:   2D echo 05/13/2020 IMPRESSIONS     1. Left ventricular ejection fraction, by estimation, is  50 to 55%. The  left ventricle has normal function. The left ventricle has no regional  wall motion abnormalities. There is moderate left ventricular hypertrophy.  Left ventricular diastolic  parameters are consistent with Grade I diastolic dysfunction (impaired  relaxation).   2. Right ventricular systolic function is normal. The right ventricular  size is normal.   3. The mitral valve is normal in structure. No evidence of mitral valve  regurgitation. No evidence of mitral stenosis.   4. The aortic valve is tricuspid. Aortic valve regurgitation is mild. No  aortic stenosis is present.   5. The inferior vena cava is normal in size with greater than 50%  respiratory variability, suggesting right atrial pressure of 3 mmHg.   Jan 2018 echo Study Conclusions   - Left ventricle: The cavity size was normal. Wall thickness was   increased in a pattern of moderate LVH. Systolic function was   normal. The estimated ejection fraction was in the range of 50%   to 55%. Doppler parameters are consistent with abnormal left   ventricular relaxation (grade 1 diastolic dysfunction). - Aortic valve: Mildly calcified annulus. Trileaflet; mildly   thickened leaflets. Valve area (VTI): 2.26 cm^2. Valve area   (Vmax): 2.1 cm^2. Valve area (Vmean): 2.01 cm^2. - Technically adequate study.             ASSESSMENT:    1. Coronary artery disease involving native coronary artery of native heart without angina pectoris   2. Essential hypertension   3. Hyperlipidemia, unspecified hyperlipidemia type   4. Stage 3b chronic kidney disease ([place])   5. Chronic diastolic CHF (congestive heart failure) ([facility])      PLAN:  In order of problems listed above:  CAD inferior STEMI 12/2015 treated with DES to the RCA, reperfusion complicated by VT received defibrillation, echo 12/2015 LVEF 40 to 45%,   echo 04/2020 LVEF 50 to 55% with moderate LVH. No angina Continue aspirin, statin and metoprolol.  Follow-up with  Dr. Harl Bowie in 6 months.  Chronic diastolic CHF-started  on lasix 40 mg bid by renal but has cut back to once daily. Swelling improved. Crt stable. 2 gm sodium diet.   Essential hypertension BP high today. Amlodipine 10 mg stopped by renal in June.will start back at low dose 5 mg daily. Give BP cuff. F/u with renal tomorrow and PCP Nov 1.    Hyperlipidemia PCP drew recently. Will request copy of labs.   CKD stage IIIb creatinine 1.85 yest           Medication Adjustments/Labs and Tests Ordered: Current medicines are reviewed at length with the patient today.  Concerns regarding medicines are outlined above.  Medication changes, Labs and Tests ordered today are listed in the Patient Instructions below. Patient Instructions  Medication Instructions:  Your physician has recommended you make the following change in your medication:   Restart Amlodipine 5 mg Daily   You have been given a blood pressure cuff today   *If you need a refill on your cardiac medications before your next appointment, please call your pharmacy*   Lab Work: NONE   If you have labs (blood work) drawn today and your tests are completely normal, you will receive your results only by: [name] Kitchen MyChart Message (if you have MyChart) OR . A paper copy in the mail If you have any lab test that is abnormal or we need to change your treatment, we will call you to review the results.   Testing/Procedures: NONE   Follow-Up: At [facility], you and your health needs are our priority.  As part of our continuing mission to provide you with exceptional heart care, we have created designated Provider Care Teams.  These Care Teams include your primary Cardiologist (physician) and Advanced Practice Providers (APPs -  Physician Assistants and Nurse Practitioners) who all work together to provide you with the care you need, when you need it.  We recommend signing up for the patient portal called "MyChart".  Sign up information is  provided on this After Visit Summary.  MyChart is used to connect with patients for Virtual Visits (Telemedicine).  Patients are able to view lab/test results, encounter notes, upcoming appointments, etc.  Non-urgent messages can be sent to your provider as well.   To learn more about what you can do with MyChart, go to [email or web address].    Your next appointment:   6 month(s)  The format for your next appointment:   In Person  Provider:   Carlyle Dolly, MD   Other Instructions Thank you for choosing [facility]!       Signed, Ermalinda Barrios, PA-C  09/15/2020 2:02 PM    [facility] [place], [place], [place]  [address] Phone: [phone]; Fax: [phone]

## 2020-09-15 ENCOUNTER — Encounter: Payer: Self-pay | Admitting: Physician Assistant

## 2020-09-15 ENCOUNTER — Other Ambulatory Visit: Payer: Self-pay

## 2020-09-15 ENCOUNTER — Ambulatory Visit (INDEPENDENT_AMBULATORY_CARE_PROVIDER_SITE_OTHER): Payer: Medicare Other | Admitting: Physician Assistant

## 2020-09-15 ENCOUNTER — Ambulatory Visit: Payer: Medicare Other | Admitting: Cardiology

## 2020-09-15 VITALS — BP 158/88 | HR 85 | Ht 68.5 in | Wt 226.0 lb

## 2020-09-15 DIAGNOSIS — I5032 Chronic diastolic (congestive) heart failure: Secondary | ICD-10-CM

## 2020-09-15 DIAGNOSIS — N1832 Chronic kidney disease, stage 3b: Secondary | ICD-10-CM

## 2020-09-15 DIAGNOSIS — E785 Hyperlipidemia, unspecified: Secondary | ICD-10-CM

## 2020-09-15 DIAGNOSIS — I2510 Atherosclerotic heart disease of native coronary artery without angina pectoris: Secondary | ICD-10-CM | POA: Diagnosis not present

## 2020-09-15 DIAGNOSIS — I10 Essential (primary) hypertension: Secondary | ICD-10-CM

## 2020-09-15 MED ORDER — AMLODIPINE BESYLATE 5 MG PO TABS
5.0000 mg | ORAL_TABLET | Freq: Every day | ORAL | 3 refills | Status: DC
Start: 2020-09-15 — End: 2021-10-07

## 2020-09-15 NOTE — Patient Instructions (Signed)
Medication Instructions:  Your physician has recommended you make the following change in your medication:   Restart Amlodipine 5 mg Daily   You have been given a blood pressure cuff today   *If you need a refill on your cardiac medications before your next appointment, please call your pharmacy*   Lab Work: NONE   If you have labs (blood work) drawn today and your tests are completely normal, you will receive your results only by: [name] Kitchen MyChart Message (if you have MyChart) OR . A paper copy in the mail If you have any lab test that is abnormal or we need to change your treatment, we will call you to review the results.   Testing/Procedures: NONE   Follow-Up: At [facility], you and your health needs are our priority.  As part of our continuing mission to provide you with exceptional heart care, we have created designated Provider Care Teams.  These Care Teams include your primary Cardiologist (physician) and Advanced Practice Providers (APPs -  Physician Assistants and Nurse Practitioners) who all work together to provide you with the care you need, when you need it.  We recommend signing up for the patient portal called "MyChart".  Sign up information is provided on this After Visit Summary.  MyChart is used to connect with patients for Virtual Visits (Telemedicine).  Patients are able to view lab/test results, encounter notes, upcoming appointments, etc.  Non-urgent messages can be sent to your provider as well.   To learn more about what you can do with MyChart, go to [email or web address].    Your next appointment:   6 month(s)  The format for your next appointment:   In Person  Provider:   Carlyle Dolly, MD   Other Instructions Thank you for choosing [facility]!

## 2020-12-23 DIAGNOSIS — K219 Gastro-esophageal reflux disease without esophagitis: Secondary | ICD-10-CM | POA: Diagnosis not present

## 2020-12-23 DIAGNOSIS — E1165 Type 2 diabetes mellitus with hyperglycemia: Secondary | ICD-10-CM | POA: Diagnosis not present

## 2020-12-23 DIAGNOSIS — E7849 Other hyperlipidemia: Secondary | ICD-10-CM | POA: Diagnosis not present

## 2020-12-23 DIAGNOSIS — M2559 Pain in other specified joint: Secondary | ICD-10-CM | POA: Diagnosis not present

## 2021-01-15 IMAGING — US US EXTREM LOW VENOUS
1 series · 13 of 24 positions shown · non-contrast
Comparison: None.

CLINICAL DATA: Leg edema for 2 days.



[Series 1: us extrem low venous · 0.09mm/px · 13 of 87 slices shown]
[im 1/87]
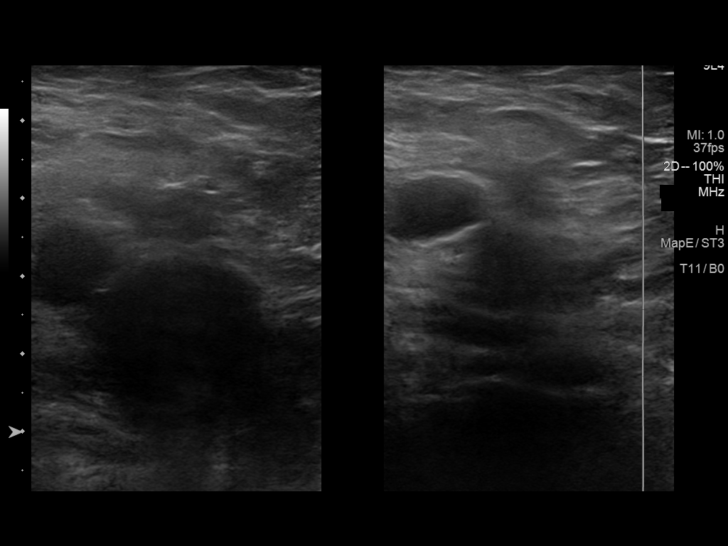
[im 8/87]
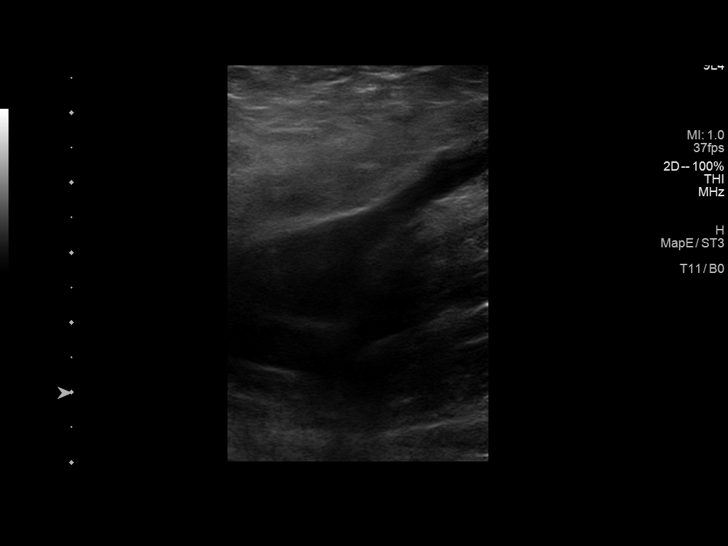
[im 15/87]
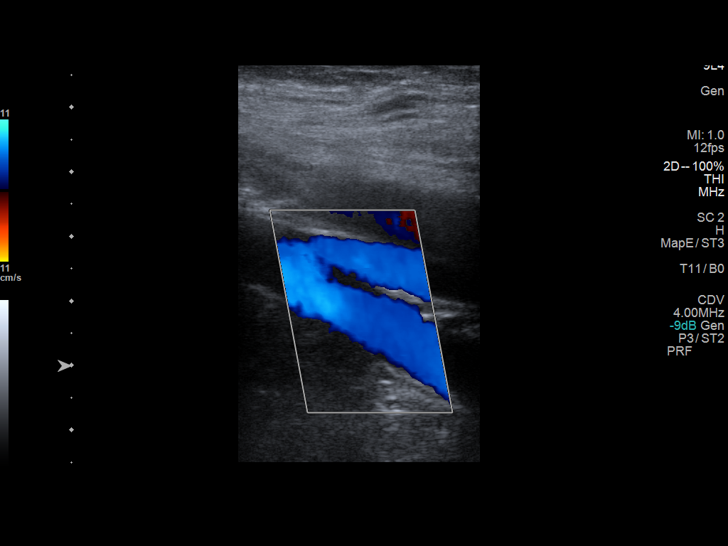
[im 23/87]
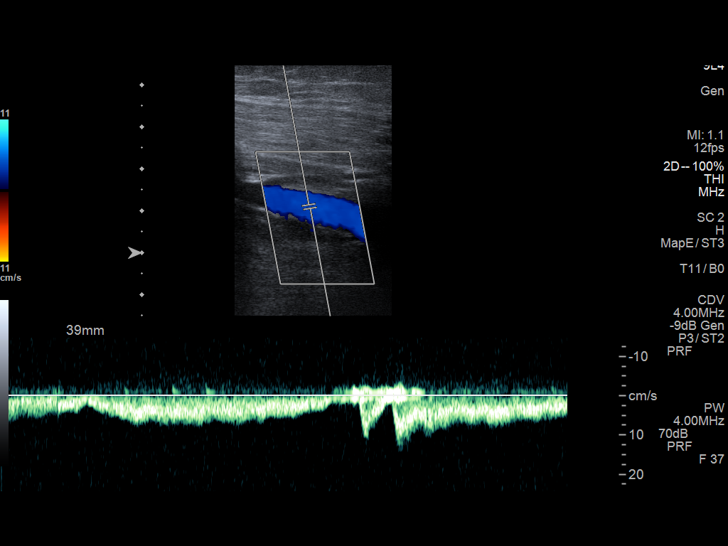
[im 30/87]
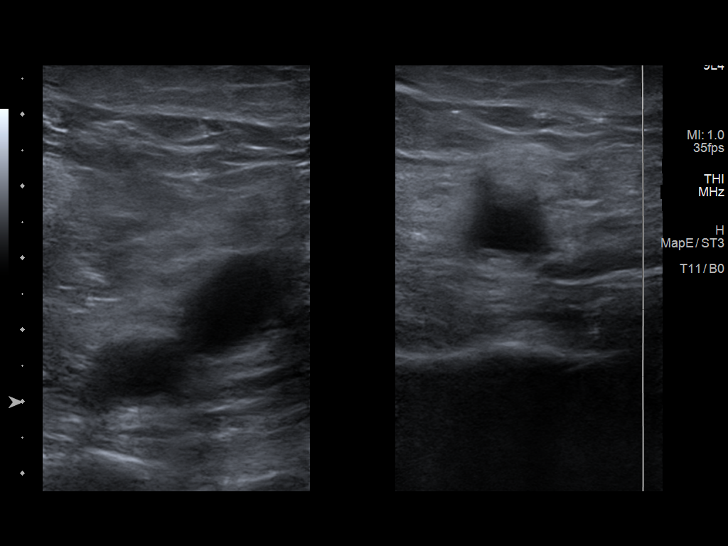
[im 38/87]
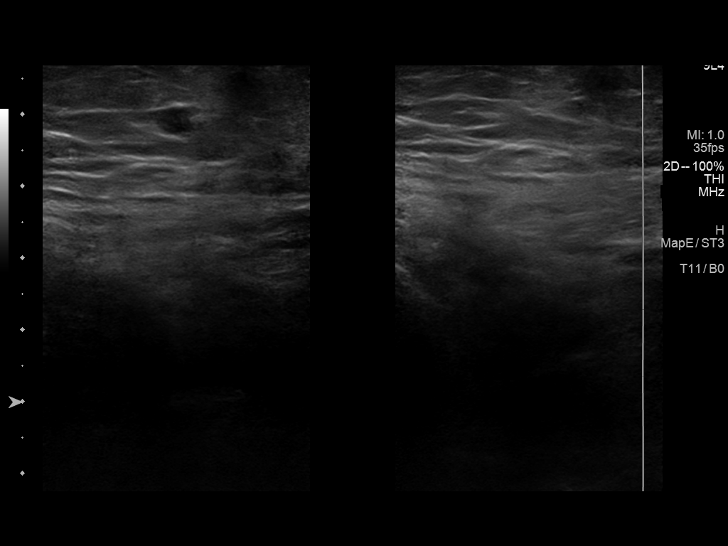
[im 45/87]
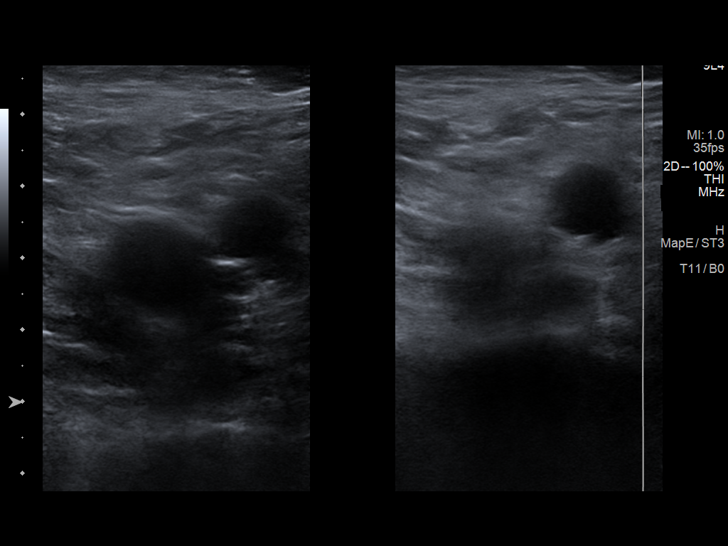
[im 49/87]
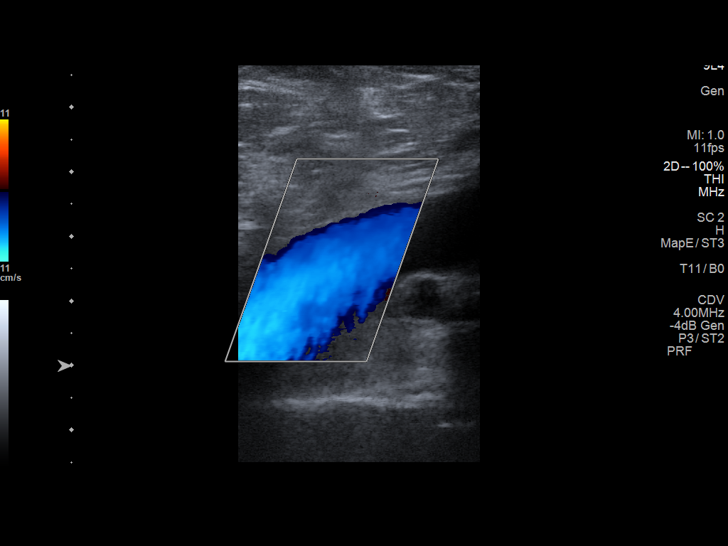
[im 57/87]
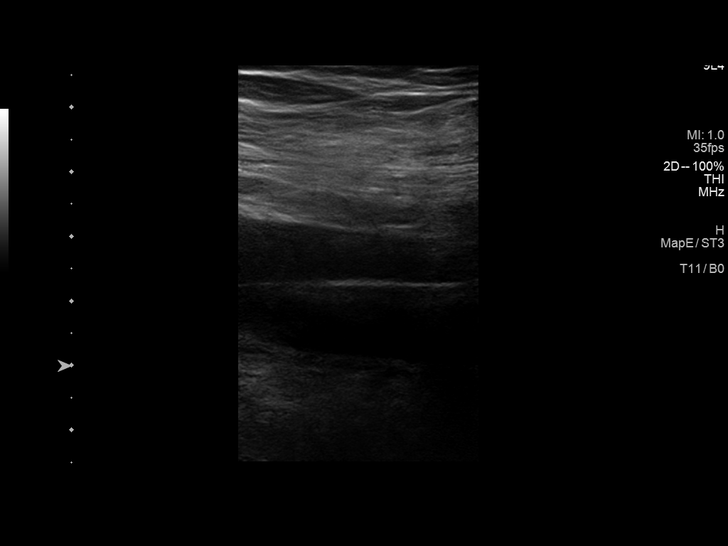
[im 64/87]
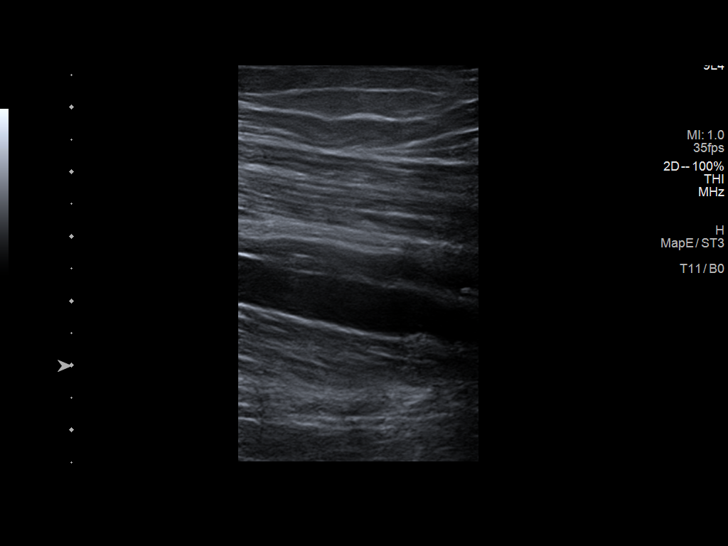
[im 72/87]
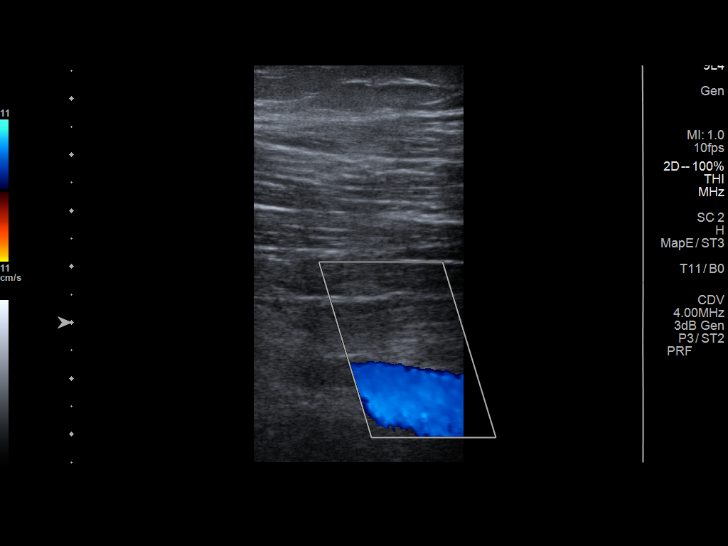
[im 79/87]
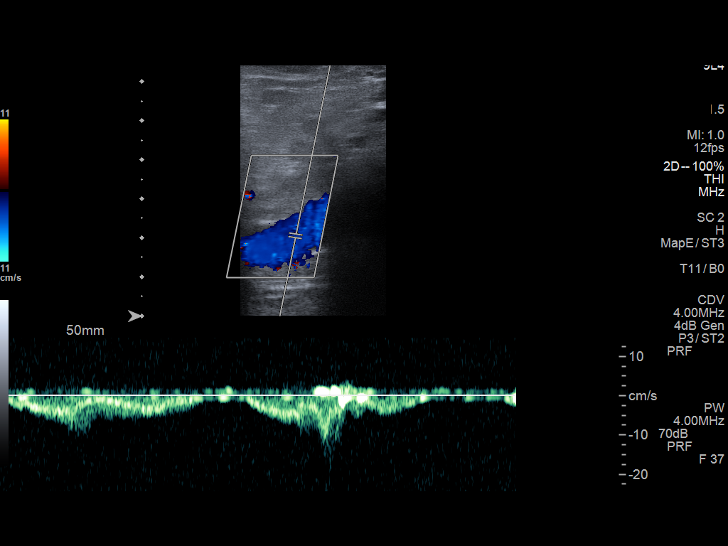
[im 87/87]
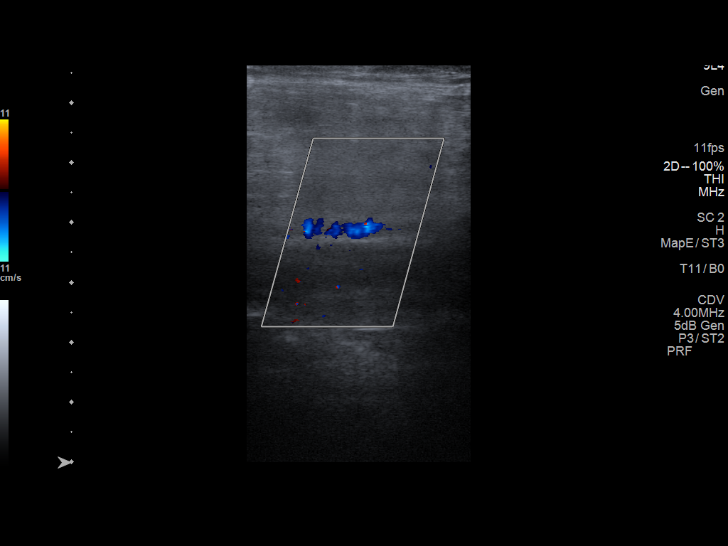

[13 of 24 positions shown; findings below may reference images not displayed]

FINDINGS: RIGHT LOWER EXTREMITY

Common Femoral Vein: No evidence of thrombus. Normal
compressibility, respiratory phasicity and response to augmentation.

Saphenofemoral Junction: No evidence of thrombus. Normal
compressibility and flow on color Doppler imaging.

Profunda Femoral Vein: No evidence of thrombus. Normal
compressibility and flow on color Doppler imaging.

Femoral Vein: No evidence of thrombus. Normal compressibility,
respiratory phasicity and response to augmentation.

Popliteal Vein: No evidence of thrombus. Normal compressibility,
respiratory phasicity and response to augmentation.

Calf Veins: Visualized right deep calf veins are patent without
thrombus.

Superficial Great Saphenous Vein: No evidence of thrombus. Normal
compressibility.

Other Findings:  None.

LEFT LOWER EXTREMITY

Common Femoral Vein: No evidence of thrombus. Normal
compressibility, respiratory phasicity and response to augmentation.

Saphenofemoral Junction: No evidence of thrombus. Normal
compressibility and flow on color Doppler imaging.

Profunda Femoral Vein: No evidence of thrombus. Normal
compressibility and flow on color Doppler imaging.

Femoral Vein: No evidence of thrombus. Normal compressibility,
respiratory phasicity and response to augmentation.

Popliteal Vein: No evidence of thrombus. Normal compressibility,
respiratory phasicity and response to augmentation.

Calf Veins: Visualized left deep calf veins are patent without
thrombus.

Superficial Great Saphenous Vein: No evidence of thrombus. Normal
compressibility.

Other Findings:  None.
IMPRESSION: No evidence of deep venous thrombosis in either lower extremity.

## 2021-01-19 DIAGNOSIS — D649 Anemia, unspecified: Secondary | ICD-10-CM | POA: Diagnosis not present

## 2021-01-19 DIAGNOSIS — N1831 Chronic kidney disease, stage 3a: Secondary | ICD-10-CM | POA: Diagnosis not present

## 2021-01-19 DIAGNOSIS — Z79899 Other long term (current) drug therapy: Secondary | ICD-10-CM | POA: Diagnosis not present

## 2021-01-19 DIAGNOSIS — M0609 Rheumatoid arthritis without rheumatoid factor, multiple sites: Secondary | ICD-10-CM | POA: Diagnosis not present

## 2021-01-20 DIAGNOSIS — E211 Secondary hyperparathyroidism, not elsewhere classified: Secondary | ICD-10-CM | POA: Diagnosis not present

## 2021-01-20 DIAGNOSIS — R809 Proteinuria, unspecified: Secondary | ICD-10-CM | POA: Diagnosis not present

## 2021-01-20 DIAGNOSIS — E1129 Type 2 diabetes mellitus with other diabetic kidney complication: Secondary | ICD-10-CM | POA: Diagnosis not present

## 2021-01-20 DIAGNOSIS — N189 Chronic kidney disease, unspecified: Secondary | ICD-10-CM | POA: Diagnosis not present

## 2021-01-20 DIAGNOSIS — E1122 Type 2 diabetes mellitus with diabetic chronic kidney disease: Secondary | ICD-10-CM | POA: Diagnosis not present

## 2021-01-27 DIAGNOSIS — E1129 Type 2 diabetes mellitus with other diabetic kidney complication: Secondary | ICD-10-CM | POA: Diagnosis not present

## 2021-01-27 DIAGNOSIS — E1122 Type 2 diabetes mellitus with diabetic chronic kidney disease: Secondary | ICD-10-CM | POA: Diagnosis not present

## 2021-01-27 DIAGNOSIS — D638 Anemia in other chronic diseases classified elsewhere: Secondary | ICD-10-CM | POA: Diagnosis not present

## 2021-01-27 DIAGNOSIS — I129 Hypertensive chronic kidney disease with stage 1 through stage 4 chronic kidney disease, or unspecified chronic kidney disease: Secondary | ICD-10-CM | POA: Diagnosis not present

## 2021-01-27 DIAGNOSIS — N189 Chronic kidney disease, unspecified: Secondary | ICD-10-CM | POA: Diagnosis not present

## 2021-01-27 DIAGNOSIS — R809 Proteinuria, unspecified: Secondary | ICD-10-CM | POA: Diagnosis not present

## 2021-01-27 DIAGNOSIS — E211 Secondary hyperparathyroidism, not elsewhere classified: Secondary | ICD-10-CM | POA: Diagnosis not present

## 2021-01-27 DIAGNOSIS — I5032 Chronic diastolic (congestive) heart failure: Secondary | ICD-10-CM | POA: Diagnosis not present

## 2021-02-23 DIAGNOSIS — E1165 Type 2 diabetes mellitus with hyperglycemia: Secondary | ICD-10-CM | POA: Diagnosis not present

## 2021-02-23 DIAGNOSIS — I110 Hypertensive heart disease with heart failure: Secondary | ICD-10-CM | POA: Diagnosis not present

## 2021-02-23 DIAGNOSIS — E7849 Other hyperlipidemia: Secondary | ICD-10-CM | POA: Diagnosis not present

## 2021-02-23 DIAGNOSIS — K219 Gastro-esophageal reflux disease without esophagitis: Secondary | ICD-10-CM | POA: Diagnosis not present

## 2021-03-22 ENCOUNTER — Encounter: Payer: Self-pay | Admitting: Cardiology

## 2021-03-22 ENCOUNTER — Ambulatory Visit (INDEPENDENT_AMBULATORY_CARE_PROVIDER_SITE_OTHER): Payer: Medicare Other | Admitting: Cardiology

## 2021-03-22 ENCOUNTER — Other Ambulatory Visit: Payer: Self-pay

## 2021-03-22 VITALS — BP 152/98 | HR 86 | Ht 68.5 in | Wt 233.0 lb

## 2021-03-22 DIAGNOSIS — I2510 Atherosclerotic heart disease of native coronary artery without angina pectoris: Secondary | ICD-10-CM

## 2021-03-22 DIAGNOSIS — E782 Mixed hyperlipidemia: Secondary | ICD-10-CM | POA: Diagnosis not present

## 2021-03-22 DIAGNOSIS — I10 Essential (primary) hypertension: Secondary | ICD-10-CM | POA: Diagnosis not present

## 2021-03-22 NOTE — Patient Instructions (Addendum)
Medication Instructions:  Your physician recommends that you continue on your current medications as directed. Please refer to the Current Medication list given to you today.  *If you need a refill on your cardiac medications before your next appointment, please call your pharmacy*   Lab Work: Requested labs from your Primary Care Provider If you have labs (blood work) drawn today and your tests are completely normal, you will receive your results only by: [name] Kitchen MyChart Message (if you have MyChart) OR . A paper copy in the mail If you have any lab test that is abnormal or we need to change your treatment, we will call you to review the results.   Testing/Procedures: None   Follow-Up: At [facility], you and your health needs are our priority.  As part of our continuing mission to provide you with exceptional heart care, we have created designated Provider Care Teams.  These Care Teams include your primary Cardiologist (physician) and Advanced Practice Providers (APPs -  Physician Assistants and Nurse Practitioners) who all work together to provide you with the care you need, when you need it.  We recommend signing up for the patient portal called "MyChart".  Sign up information is provided on this After Visit Summary.  MyChart is used to connect with patients for Virtual Visits (Telemedicine).  Patients are able to view lab/test results, encounter notes, upcoming appointments, etc.  Non-urgent messages can be sent to your provider as well.   To learn more about what you can do with MyChart, go to [email or web address].    Your next appointment:   6 month(s)  The format for your next appointment:   In Person  Provider:   You may see Carlyle Dolly, MD or one of the following Advanced Practice Providers on your designated Care Team:    Bernerd Pho, [place]     Other Instructions Update our office Friday with BP log for the week.

## 2021-03-22 NOTE — Progress Notes (Signed)
Clinical Summary Darrell Marshall is a 86 y.o.male seen today for follow up of the following medical problems.   1. CAD - admit 12/2015 with inferior STEMI, received DES to RCA. Reperfusion complicated by VT, receiveddefibrilation.  - echo 12/2015 LVEF 40-45%. - echo Jan 2018 LVEF 38-10%, grade I diastolic dysfunction - echo 04/2020 LVEF 50-55%, grade I dd, normal RV    - no recent chest pain. No SOB/DOE - compliant with meds  2. HTN - he is compliant with meds  - unclear if swelling in the past was related to norvasc. Retrying, taking 66m daily without swelling.     3. Hyperlipidemia - - labs followed by pcp, family reporst recently checked - compliant with statin  4. CKD - followed by Dr BTheador HawthornePast Medical History:  Diagnosis Date  . Atherosclerotic heart disease of native coronary artery with unspecified angina pectoris ([place]   . CAD (coronary artery disease), native coronary artery    cath 01/12/2016 50% ost D1, 60% prox LCx, 100% distal RCA lesion treated with DES  . Carpal tunnel syndrome, bilateral upper limbs   . Chronic systolic (congestive) heart failure ([place]   . CKD (chronic kidney disease), stage III ([facility] 01/15/2016  . Diabetes mellitus without complication ([facility]   . Essential (primary) hypertension   . Hypertension   . Hypertensive chronic kidney disease with stage 1 through stage 4 chronic kidney disease, or unspecified chronic kidney disease   . Ischemic cardiomyopathy    Echo 01/13/2016 EF 40-45%  . Kidney disease, chronic, stage III (moderate, EGFR 30-59 ml/min) (HCC)   . Obesity, unspecified   . Reflux esophagitis   . Rheumatoid arthritis, unspecified ([facility]   . ST elevation myocardial infarction (STEMI) of inferior wall ([place] 01/12/2016  . Subsequent ST elevation (STEMI) myocardial infarction of anterior wall ([facility]   . Type 2 diabetes mellitus with diabetic nephropathy ([facility]   . Type 2 diabetes mellitus with unspecified complications (HCC)       No Known Allergies   Current Outpatient Medications  Medication Sig Dispense Refill  . amLODipine (NORVASC) 5 MG tablet Take 1 tablet (5 mg total) by mouth daily. 90 tablet 3  . aspirin 81 MG chewable tablet Chew 1 tablet (81 mg total) by mouth daily.    .[name] Kitchenatorvastatin (LIPITOR) 80 MG tablet Take 1 tablet (80 mg total) by mouth daily at 6 PM. 90 tablet 3  . famotidine (PEPCID) 40 MG tablet Take 40 mg by mouth daily.    . furosemide (LASIX) 40 MG tablet Take 40 mg by mouth daily.    .[name] Kitchenglimepiride (AMARYL) 1 MG tablet Take 1 tablet (1 mg total) by mouth daily with breakfast. 30 tablet 0  . hydrALAZINE (APRESOLINE) 50 MG tablet Take 50 mg by mouth 2 (two) times daily.    .[name] KitchenLANTUS SOLOSTAR 100 UNIT/ML Solostar Pen Inject 10 Units into the skin at bedtime.    .[name] Kitchenleflunomide (ARAVA) 20 MG tablet Take 20 mg by mouth daily.    . metoprolol succinate (TOPROL-XL) 25 MG 24 hr tablet Take 25 mg by mouth 2 (two) times daily.   3  . Multiple Vitamins-Minerals (MULTIVITAMIN ADULTS 50+ PO) Take 1 tablet by mouth daily. Men    . nitroGLYCERIN (NITROSTAT) 0.4 MG SL tablet Place 1 tablet (0.4 mg total) under the tongue every 5 (five) minutes x 3 doses as needed for chest pain. 25 tablet 3  . triamcinolone ointment (KENALOG) 0.5 % Apply 1 application topically 2 (  two) times daily. As needed    . vitamin B-12 (CYANOCOBALAMIN) 500 MCG tablet Take 1,000 mcg by mouth daily.      No current facility-administered medications for this visit.     Past Surgical History:  Procedure Laterality Date  . APPENDECTOMY    . CARDIAC CATHETERIZATION N/A 01/12/2016   Procedure: Left Heart Cath and Coronary Angiography;  Surgeon: Sherren Mocha, MD;  Location: [facility];  Service: Cardiovascular;  Laterality: N/A;  . CARDIAC CATHETERIZATION N/A 01/12/2016   Procedure: Coronary Stent Intervention;  Surgeon: Sherren Mocha, MD;  Location: [facility];  Service: Cardiovascular;  Laterality: N/A;  . CATARACT  EXTRACTION W/PHACO Right 12/30/2019   Procedure: CATARACT EXTRACTION PHACO AND INTRAOCULAR LENS PLACEMENT (IOC);  Surgeon: Baruch Goldmann, MD;  Location: [facility];  Service: Ophthalmology;  Laterality: Right;  CDE: 4.06  . CATARACT EXTRACTION W/PHACO Left 01/13/2020   Procedure: CATARACT EXTRACTION PHACO AND INTRAOCULAR LENS PLACEMENT (IOC);  Surgeon: Baruch Goldmann, MD;  Location: [facility];  Service: Ophthalmology;  Laterality: Left;  CDE: 6.68  . CORONARY STENT PLACEMENT  01/12/16     No Known Allergies    Family History  Problem Relation Age of Onset  . Ovarian cancer Mother   . Diabetes Mother   . Hypertension Mother   . Prostate cancer Father   . Heart attack Neg Hx   . Stroke Neg Hx      Social History Darrell Marshall reports that he has never smoked. He has never used smokeless tobacco. Darrell Marshall reports no history of alcohol use.   Review of Systems CONSTITUTIONAL: No weight loss, fever, chills, weakness or fatigue.  HEENT: Eyes: No visual loss, blurred vision, double vision or yellow sclerae.No hearing loss, sneezing, congestion, runny nose or sore throat.  SKIN: No rash or itching.  CARDIOVASCULAR: per hpi RESPIRATORY: No shortness of breath, cough or sputum.  GASTROINTESTINAL: No anorexia, nausea, vomiting or diarrhea. No abdominal pain or blood.  GENITOURINARY: No burning on urination, no polyuria NEUROLOGICAL: No headache, dizziness, syncope, paralysis, ataxia, numbness or tingling in the extremities. No change in bowel or bladder control.  MUSCULOSKELETAL: No muscle, back pain, joint pain or stiffness.  LYMPHATICS: No enlarged nodes. No history of splenectomy.  PSYCHIATRIC: No history of depression or anxiety.  ENDOCRINOLOGIC: No reports of sweating, cold or heat intolerance. No polyuria or polydipsia.  [name] Kitchen   Physical Examination Today's Vitals   03/22/21 1400  BP: (!) 152/98  Pulse: 86  SpO2: 97%  Weight: 233 lb (105.7 kg)  Height: 5' 8.5" (1.74 m)   Body mass  index is 34.91 kg/m.  Gen: resting comfortably, no acute distress HEENT: no scleral icterus, pupils equal round and reactive, no palptable cervical adenopathy,  CV: RRR, no m/r/g, no jvd Resp: Clear to auscultation bilaterally GI: abdomen is soft, non-tender, non-distended, normal bowel sounds, no hepatosplenomegaly MSK: extremities are warm, no edema.  Skin: warm, no rash Neuro:  no focal deficits Psych: appropriate affect   Diagnostic Studies Jan 2018 echo Study Conclusions  - Left ventricle: The cavity size was normal. Wall thickness was increased in a pattern of moderate LVH. Systolic function was normal. The estimated ejection fraction was in the range of 50% to 55%. Doppler parameters are consistent with abnormal left ventricular relaxation (grade 1 diastolic dysfunction). - Aortic valve: Mildly calcified annulus. Trileaflet; mildly thickened leaflets. Valve area (VTI): 2.26 cm^2. Valve area (Vmax): 2.1 cm^2. Valve area (Vmean): 2.01 cm^2. - Technically adequate study.  Assessment and Plan  1. CAD -no symptoms, continue current meds  2. HTN - elevated today. Had been at goal at nephrology appt last month,they report was also doing well at recent pcp visit - he will call Friday with udpated home bp's, if remains elevated would increase his hydralazine.   3.Hyperlipidemia - request pcp labs, continue statin      Arnoldo Lenis, M.D.

## 2021-03-26 DIAGNOSIS — E7849 Other hyperlipidemia: Secondary | ICD-10-CM | POA: Diagnosis not present

## 2021-03-26 DIAGNOSIS — R7989 Other specified abnormal findings of blood chemistry: Secondary | ICD-10-CM | POA: Diagnosis not present

## 2021-03-26 DIAGNOSIS — E1165 Type 2 diabetes mellitus with hyperglycemia: Secondary | ICD-10-CM | POA: Diagnosis not present

## 2021-03-29 ENCOUNTER — Other Ambulatory Visit: Payer: Self-pay

## 2021-03-29 ENCOUNTER — Ambulatory Visit (INDEPENDENT_AMBULATORY_CARE_PROVIDER_SITE_OTHER): Payer: Medicare Other

## 2021-03-29 VITALS — BP 161/83 | HR 92

## 2021-03-29 DIAGNOSIS — Z0130 Encounter for examination of blood pressure without abnormal findings: Secondary | ICD-10-CM

## 2021-03-29 NOTE — Progress Notes (Signed)
  Patient brought in home BP machine and we put batteries in it and patient and family were able to use it. We gave them a BP monitor log to record at home. They will call back in 1 week with readings.

## 2021-04-01 DIAGNOSIS — E1129 Type 2 diabetes mellitus with other diabetic kidney complication: Secondary | ICD-10-CM | POA: Diagnosis not present

## 2021-04-01 DIAGNOSIS — I129 Hypertensive chronic kidney disease with stage 1 through stage 4 chronic kidney disease, or unspecified chronic kidney disease: Secondary | ICD-10-CM | POA: Diagnosis not present

## 2021-04-01 DIAGNOSIS — R809 Proteinuria, unspecified: Secondary | ICD-10-CM | POA: Diagnosis not present

## 2021-04-01 DIAGNOSIS — I5032 Chronic diastolic (congestive) heart failure: Secondary | ICD-10-CM | POA: Diagnosis not present

## 2021-04-01 DIAGNOSIS — N189 Chronic kidney disease, unspecified: Secondary | ICD-10-CM | POA: Diagnosis not present

## 2021-04-01 DIAGNOSIS — D638 Anemia in other chronic diseases classified elsewhere: Secondary | ICD-10-CM | POA: Diagnosis not present

## 2021-04-01 DIAGNOSIS — E1122 Type 2 diabetes mellitus with diabetic chronic kidney disease: Secondary | ICD-10-CM | POA: Diagnosis not present

## 2021-04-01 DIAGNOSIS — E211 Secondary hyperparathyroidism, not elsewhere classified: Secondary | ICD-10-CM | POA: Diagnosis not present

## 2021-04-05 DIAGNOSIS — R5382 Chronic fatigue, unspecified: Secondary | ICD-10-CM | POA: Diagnosis not present

## 2021-04-05 DIAGNOSIS — R21 Rash and other nonspecific skin eruption: Secondary | ICD-10-CM | POA: Diagnosis not present

## 2021-04-05 DIAGNOSIS — I110 Hypertensive heart disease with heart failure: Secondary | ICD-10-CM | POA: Diagnosis not present

## 2021-04-05 DIAGNOSIS — E1165 Type 2 diabetes mellitus with hyperglycemia: Secondary | ICD-10-CM | POA: Diagnosis not present

## 2021-04-29 DIAGNOSIS — N1831 Chronic kidney disease, stage 3a: Secondary | ICD-10-CM | POA: Diagnosis not present

## 2021-04-29 DIAGNOSIS — D649 Anemia, unspecified: Secondary | ICD-10-CM | POA: Diagnosis not present

## 2021-04-29 DIAGNOSIS — R21 Rash and other nonspecific skin eruption: Secondary | ICD-10-CM | POA: Diagnosis not present

## 2021-04-29 DIAGNOSIS — M0609 Rheumatoid arthritis without rheumatoid factor, multiple sites: Secondary | ICD-10-CM | POA: Diagnosis not present

## 2021-04-29 DIAGNOSIS — Z79899 Other long term (current) drug therapy: Secondary | ICD-10-CM | POA: Diagnosis not present

## 2021-05-25 DIAGNOSIS — I110 Hypertensive heart disease with heart failure: Secondary | ICD-10-CM | POA: Diagnosis not present

## 2021-06-10 DIAGNOSIS — E1129 Type 2 diabetes mellitus with other diabetic kidney complication: Secondary | ICD-10-CM | POA: Diagnosis not present

## 2021-06-10 DIAGNOSIS — I129 Hypertensive chronic kidney disease with stage 1 through stage 4 chronic kidney disease, or unspecified chronic kidney disease: Secondary | ICD-10-CM | POA: Diagnosis not present

## 2021-06-10 DIAGNOSIS — E1122 Type 2 diabetes mellitus with diabetic chronic kidney disease: Secondary | ICD-10-CM | POA: Diagnosis not present

## 2021-06-10 DIAGNOSIS — N189 Chronic kidney disease, unspecified: Secondary | ICD-10-CM | POA: Diagnosis not present

## 2021-06-10 DIAGNOSIS — I5032 Chronic diastolic (congestive) heart failure: Secondary | ICD-10-CM | POA: Diagnosis not present

## 2021-06-17 DIAGNOSIS — E211 Secondary hyperparathyroidism, not elsewhere classified: Secondary | ICD-10-CM | POA: Diagnosis not present

## 2021-06-17 DIAGNOSIS — I129 Hypertensive chronic kidney disease with stage 1 through stage 4 chronic kidney disease, or unspecified chronic kidney disease: Secondary | ICD-10-CM | POA: Diagnosis not present

## 2021-06-17 DIAGNOSIS — E1129 Type 2 diabetes mellitus with other diabetic kidney complication: Secondary | ICD-10-CM | POA: Diagnosis not present

## 2021-06-17 DIAGNOSIS — N189 Chronic kidney disease, unspecified: Secondary | ICD-10-CM | POA: Diagnosis not present

## 2021-06-17 DIAGNOSIS — R809 Proteinuria, unspecified: Secondary | ICD-10-CM | POA: Diagnosis not present

## 2021-06-17 DIAGNOSIS — D638 Anemia in other chronic diseases classified elsewhere: Secondary | ICD-10-CM | POA: Diagnosis not present

## 2021-06-17 DIAGNOSIS — E1122 Type 2 diabetes mellitus with diabetic chronic kidney disease: Secondary | ICD-10-CM | POA: Diagnosis not present

## 2021-06-17 DIAGNOSIS — I5032 Chronic diastolic (congestive) heart failure: Secondary | ICD-10-CM | POA: Diagnosis not present

## 2021-07-19 ENCOUNTER — Other Ambulatory Visit: Payer: Self-pay

## 2021-07-19 ENCOUNTER — Encounter: Payer: Self-pay | Admitting: Internal Medicine

## 2021-07-19 ENCOUNTER — Ambulatory Visit (INDEPENDENT_AMBULATORY_CARE_PROVIDER_SITE_OTHER): Payer: Medicare Other | Admitting: Internal Medicine

## 2021-07-19 VITALS — BP 136/64 | HR 91 | Temp 96.7°F | Ht 68.0 in | Wt 231.1 lb

## 2021-07-19 DIAGNOSIS — Z23 Encounter for immunization: Secondary | ICD-10-CM

## 2021-07-19 DIAGNOSIS — M069 Rheumatoid arthritis, unspecified: Secondary | ICD-10-CM

## 2021-07-19 DIAGNOSIS — Z9861 Coronary angioplasty status: Secondary | ICD-10-CM

## 2021-07-19 DIAGNOSIS — I10 Essential (primary) hypertension: Secondary | ICD-10-CM | POA: Diagnosis not present

## 2021-07-19 DIAGNOSIS — I5032 Chronic diastolic (congestive) heart failure: Secondary | ICD-10-CM | POA: Diagnosis not present

## 2021-07-19 DIAGNOSIS — Z794 Long term (current) use of insulin: Secondary | ICD-10-CM

## 2021-07-19 DIAGNOSIS — I2510 Atherosclerotic heart disease of native coronary artery without angina pectoris: Secondary | ICD-10-CM

## 2021-07-19 DIAGNOSIS — E1121 Type 2 diabetes mellitus with diabetic nephropathy: Secondary | ICD-10-CM

## 2021-07-19 DIAGNOSIS — Z7689 Persons encountering health services in other specified circumstances: Secondary | ICD-10-CM | POA: Diagnosis not present

## 2021-07-19 DIAGNOSIS — N1832 Chronic kidney disease, stage 3b: Secondary | ICD-10-CM

## 2021-07-19 LAB — POCT GLYCOSYLATED HEMOGLOBIN (HGB A1C)
HbA1c, POC (controlled diabetic range): 6.1 % (ref 0.0–7.0)
HbA1c, POC (prediabetic range): 6.1 % (ref 5.7–6.4)

## 2021-07-19 NOTE — Progress Notes (Signed)
New Patient Office Visit  Subjective:  Patient ID: Darrell Marshall, male    DOB: 1935/01/19  Age: 86 y.o. MRN: [id number]  CC:  Chief Complaint  Patient presents with   Establish Care    HPI Darrell Marshall is 29 y.0 male with PMH of CAD s/p stent placement, HTN, type II DM with CKD, RA and HLD who presents for establishing care.  CAD and HTN: BP is well-controlled. Takes medications regularly. Patient denies headache, dizziness, chest pain, dyspnea or palpitations.  He follows up with cardiology for history of CAD.  He takes aspirin and statin.  He also takes metoprolol.  Type II DM: He takes Lantus 16 units at nighttime.  His blood glucose remains controlled.  His HbA1c was 6.1 today.  Denies any fatigue, polyuria, polyphagia or recent change in weight.  He also takes statin.  CKD: Follows up with nephrology.  Denies any dysuria or hematuria.  Denies any LE swelling currently.  RA: He takes [place].  He is followed by Rheumatology.  He has had COVID vaccine.  He received flu vaccine in the office today.   Past Medical History:  Diagnosis Date   Atherosclerotic heart disease of native coronary artery with unspecified angina pectoris (HCC)    CAD (coronary artery disease), native coronary artery    cath 01/12/2016 50% ost D1, 60% prox LCx, 100% distal RCA lesion treated with DES   Carpal tunnel syndrome, bilateral upper limbs    Chronic systolic (congestive) heart failure (HCC)    CKD (chronic kidney disease), stage III ([place]) 01/15/2016   Diabetes mellitus without complication ([place])    Essential (primary) hypertension    Hypertension    Hypertensive chronic kidney disease with stage 1 through stage 4 chronic kidney disease, or unspecified chronic kidney disease    Ischemic cardiomyopathy    Echo 01/13/2016 EF 40-45%   Kidney disease, chronic, stage III (moderate, EGFR 30-59 ml/min) (HCC)    Obesity, unspecified    Reflux esophagitis    Rheumatoid arthritis, unspecified (HCC)     ST elevation myocardial infarction (STEMI) of inferior wall ([place]) 01/12/2016   Subsequent ST elevation (STEMI) myocardial infarction of anterior wall (HCC)    Type 2 diabetes mellitus with diabetic nephropathy ([place])    Type 2 diabetes mellitus with unspecified complications [facility])     Past Surgical History:  Procedure Laterality Date   APPENDECTOMY     CARDIAC CATHETERIZATION N/A 01/12/2016   Procedure: Left Heart Cath and Coronary Angiography;  Surgeon: Sherren Mocha, MD;  Location: [place] [facility];  Service: Cardiovascular;  Laterality: N/A;   CARDIAC CATHETERIZATION N/A 01/12/2016   Procedure: Coronary Stent Intervention;  Surgeon: Sherren Mocha, MD;  Location: [place] [facility];  Service: Cardiovascular;  Laterality: N/A;   CATARACT EXTRACTION W/PHACO Right 12/30/2019   Procedure: CATARACT EXTRACTION PHACO AND INTRAOCULAR LENS PLACEMENT ([place]);  Surgeon: Baruch Goldmann, MD;  Location[facility];  Service: Ophthalmology;  Laterality: Right;  CDE: 4.06   CATARACT EXTRACTION W/PHACO Left 01/13/2020   Procedure: CATARACT EXTRACTION PHACO AND INTRAOCULAR LENS PLACEMENT (IOC);  Surgeon: Baruch Goldmann, MD;  [facility];  Service: Ophthalmology;  Laterality: Left;  CDE: 6.68   CORONARY STENT PLACEMENT  01/12/16    Family History  Problem Relation Age of Onset   Ovarian cancer Mother    Diabetes Mother    Hypertension Mother    Prostate cancer Father    Heart attack Neg Hx    Stroke Neg Hx  Social History   Socioeconomic History   Marital status: Widowed    Spouse name: Not on file   Number of children: Not on file   Years of education: Not on file   Highest education level: Not on file  Occupational History   Not on file  Tobacco Use   Smoking status: Never   Smokeless tobacco: Never  Vaping Use   Vaping Use: Never used  Substance and Sexual Activity   Alcohol use: No   Drug use: No   Sexual activity: Not on file  Other Topics Concern   Not on file  Social History  Narrative   Not on file   Social Determinants of Health   Financial Resource Strain: Not on file  Food Insecurity: Not on file  Transportation Needs: Not on file  Physical Activity: Not on file  Stress: Not on file  Social Connections: Not on file  Intimate Partner Violence: Not on file    ROS Review of Systems  Constitutional:  Negative for chills and fever.  HENT:  Negative for congestion and sore throat.   Eyes:  Negative for pain and discharge.  Respiratory:  Negative for cough and shortness of breath.   Cardiovascular:  Negative for chest pain and palpitations.  Gastrointestinal:  Negative for constipation, diarrhea, nausea and vomiting.  Endocrine: Negative for polydipsia and polyuria.  Genitourinary:  Negative for dysuria and hematuria.  Musculoskeletal:  Negative for neck pain and neck stiffness.  Skin:  Negative for rash.  Neurological:  Negative for dizziness, weakness, numbness and headaches.  Psychiatric/Behavioral:  Negative for agitation and behavioral problems.    Objective:   Today's Vitals: BP 136/64 (BP Location: Left Arm, Cuff Size: Normal)   Pulse 91   Temp (!) 96.7 F (35.9 C)   Ht '5\' 8"'  (1.727 m)   Wt 231 lb 1.9 oz (104.8 kg)   BMI 35.14 kg/m   Physical Exam Vitals reviewed.  Constitutional:      General: He is not in acute distress.    Appearance: He is not diaphoretic.  HENT:     Head: Normocephalic and atraumatic.     Nose: Nose normal.     Mouth/Throat:     Mouth: Mucous membranes are moist.  Eyes:     General: No scleral icterus.    Extraocular Movements: Extraocular movements intact.  Cardiovascular:     Rate and Rhythm: Normal rate and regular rhythm.     Pulses: Normal pulses.     Heart sounds: Normal heart sounds. No murmur heard. Pulmonary:     Breath sounds: Normal breath sounds. No wheezing or rales.  Abdominal:     Palpations: Abdomen is soft.     Tenderness: There is no abdominal tenderness.  Musculoskeletal:      Cervical back: Neck supple. No tenderness.     Right lower leg: No edema.     Left lower leg: No edema.  Skin:    General: Skin is warm.     Findings: No rash.  Neurological:     General: No focal deficit present.     Mental Status: He is alert and oriented to person, place, and time.     Sensory: No sensory deficit.     Motor: No weakness.  Psychiatric:        Mood and Affect: Mood normal.        Behavior: Behavior normal.    Assessment & Plan:   Problem List Items Addressed This Visit  Cardiovascular and Mediastinum   CAD S/P percutaneous coronary angioplasty    On aspirin and statin On beta-blocker Followed by cardiology      Essential (primary) hypertension    BP Readings from Last 1 Encounters:  07/19/21 136/64  Well-controlled with Amlodipine, Metoprolol and Hydralazine Counseled for compliance with the medications Advised DASH diet and moderate exercise/walking as tolerated      (HFpEF) heart failure with preserved ejection fraction ([place])    Euvolemic currently No LE edema or dyspnea currently        Endocrine   Type 2 diabetes mellitus with diabetic nephropathy ([place])    Lab Results  Component Value Date   HGBA1C 6.1 07/19/2021   HGBA1C 6.1 07/19/2021  On Lantus 16 units nightly Advised to follow diabetic diet On statin F/u CMP and lipid panel Diabetic eye exam: Advised to follow up with Ophthalmology for diabetic eye exam       Relevant Orders   POCT glycosylated hemoglobin (Hb A1C) (Completed)     Musculoskeletal and Integument   Rheumatoid arthritis, unspecified ([place])    On Arava Followed by Rheumatology        Genitourinary   CKD (chronic kidney disease), stage III ([place])    Followed by Nephrology Avoid nephrotoxic agents including NSAIDs        Other   Encounter to establish care - Primary    Care established History and medications reviewed with the patient      Other Visit Diagnoses     Need for immunization against  influenza       Relevant Orders   Flu Vaccine QUAD High Dose(Fluad) (Completed)       Outpatient Encounter Medications as of 07/19/2021  Medication Sig   amLODipine (NORVASC) 5 MG tablet Take 1 tablet (5 mg total) by mouth daily.   aspirin 81 MG chewable tablet Chew 1 tablet (81 mg total) by mouth daily.   atorvastatin (LIPITOR) 80 MG tablet Take 1 tablet (80 mg total) by mouth daily at 6 PM.   famotidine (PEPCID) 40 MG tablet Take 40 mg by mouth daily.   hydrALAZINE (APRESOLINE) 50 MG tablet Take 50 mg by mouth 2 (two) times daily.   LANTUS SOLOSTAR 100 UNIT/ML Solostar Pen Inject 16 Units into the skin at bedtime.   leflunomide (ARAVA) 20 MG tablet Take 20 mg by mouth daily.   metoprolol succinate (TOPROL-XL) 25 MG 24 hr tablet Take 25 mg by mouth 2 (two) times daily.    Multiple Vitamins-Minerals (MULTIVITAMIN ADULTS 50+ PO) Take 1 tablet by mouth daily. Men   nitroGLYCERIN (NITROSTAT) 0.4 MG SL tablet Place 1 tablet (0.4 mg total) under the tongue every 5 (five) minutes x 3 doses as needed for chest pain.   triamcinolone ointment (KENALOG) 0.5 % Apply 1 application topically 2 (two) times daily. As needed   vitamin B-12 (CYANOCOBALAMIN) 500 MCG tablet Take 1,000 mcg by mouth daily.    [DISCONTINUED] glimepiride (AMARYL) 1 MG tablet Take 1 tablet (1 mg total) by mouth daily with breakfast.   No facility-administered encounter medications on file as of 07/19/2021.    Follow-up: Return in about 3 months (around 10/19/2021) for HTN, DM and CKD.   Lindell Spar, MD

## 2021-07-19 NOTE — Patient Instructions (Signed)
Please continue to take medications as prescribed.  Please continue to follow low carb and low salt diet and ambulate as tolerated.  Please discuss with your Nephrologist about [place] for diabetes and CKD.

## 2021-07-25 NOTE — Assessment & Plan Note (Signed)
Euvolemic currently No LE edema or dyspnea currently

## 2021-07-25 NOTE — Assessment & Plan Note (Signed)
BP Readings from Last 1 Encounters:  07/19/21 136/64   Well-controlled with Amlodipine, Metoprolol and Hydralazine Counseled for compliance with the medications Advised DASH diet and moderate exercise/walking as tolerated

## 2021-07-25 NOTE — Assessment & Plan Note (Signed)
On aspirin and statin On beta-blocker Followed by cardiology

## 2021-07-25 NOTE — Assessment & Plan Note (Signed)
Care established History and medications reviewed with the patient 

## 2021-07-25 NOTE — Assessment & Plan Note (Signed)
Followed by Nephrology Avoid nephrotoxic agents including NSAIDs

## 2021-07-25 NOTE — Assessment & Plan Note (Signed)
Lab Results  Component Value Date   HGBA1C 6.1 07/19/2021   HGBA1C 6.1 07/19/2021   On Lantus 16 units nightly Advised to follow diabetic diet On statin F/u CMP and lipid panel Diabetic eye exam: Advised to follow up with Ophthalmology for diabetic eye exam

## 2021-07-25 NOTE — Assessment & Plan Note (Signed)
On Arava Followed by Rheumatology 

## 2021-08-12 DIAGNOSIS — Z79899 Other long term (current) drug therapy: Secondary | ICD-10-CM | POA: Diagnosis not present

## 2021-08-12 DIAGNOSIS — N1831 Chronic kidney disease, stage 3a: Secondary | ICD-10-CM | POA: Diagnosis not present

## 2021-08-12 DIAGNOSIS — E1122 Type 2 diabetes mellitus with diabetic chronic kidney disease: Secondary | ICD-10-CM | POA: Diagnosis not present

## 2021-08-12 DIAGNOSIS — R21 Rash and other nonspecific skin eruption: Secondary | ICD-10-CM | POA: Diagnosis not present

## 2021-08-12 DIAGNOSIS — R809 Proteinuria, unspecified: Secondary | ICD-10-CM | POA: Diagnosis not present

## 2021-08-12 DIAGNOSIS — D649 Anemia, unspecified: Secondary | ICD-10-CM | POA: Diagnosis not present

## 2021-08-12 DIAGNOSIS — I129 Hypertensive chronic kidney disease with stage 1 through stage 4 chronic kidney disease, or unspecified chronic kidney disease: Secondary | ICD-10-CM | POA: Diagnosis not present

## 2021-08-12 DIAGNOSIS — N189 Chronic kidney disease, unspecified: Secondary | ICD-10-CM | POA: Diagnosis not present

## 2021-08-12 DIAGNOSIS — I5032 Chronic diastolic (congestive) heart failure: Secondary | ICD-10-CM | POA: Diagnosis not present

## 2021-08-12 DIAGNOSIS — M0609 Rheumatoid arthritis without rheumatoid factor, multiple sites: Secondary | ICD-10-CM | POA: Diagnosis not present

## 2021-08-20 DIAGNOSIS — I5032 Chronic diastolic (congestive) heart failure: Secondary | ICD-10-CM | POA: Diagnosis not present

## 2021-08-20 DIAGNOSIS — N189 Chronic kidney disease, unspecified: Secondary | ICD-10-CM | POA: Diagnosis not present

## 2021-08-20 DIAGNOSIS — R809 Proteinuria, unspecified: Secondary | ICD-10-CM | POA: Diagnosis not present

## 2021-08-20 DIAGNOSIS — E211 Secondary hyperparathyroidism, not elsewhere classified: Secondary | ICD-10-CM | POA: Diagnosis not present

## 2021-08-20 DIAGNOSIS — E1129 Type 2 diabetes mellitus with other diabetic kidney complication: Secondary | ICD-10-CM | POA: Diagnosis not present

## 2021-08-20 DIAGNOSIS — I129 Hypertensive chronic kidney disease with stage 1 through stage 4 chronic kidney disease, or unspecified chronic kidney disease: Secondary | ICD-10-CM | POA: Diagnosis not present

## 2021-08-20 DIAGNOSIS — E1122 Type 2 diabetes mellitus with diabetic chronic kidney disease: Secondary | ICD-10-CM | POA: Diagnosis not present

## 2021-08-20 DIAGNOSIS — D638 Anemia in other chronic diseases classified elsewhere: Secondary | ICD-10-CM | POA: Diagnosis not present

## 2021-09-16 ENCOUNTER — Ambulatory Visit (INDEPENDENT_AMBULATORY_CARE_PROVIDER_SITE_OTHER): Payer: Medicare Other | Admitting: *Deleted

## 2021-09-16 ENCOUNTER — Other Ambulatory Visit: Payer: Self-pay

## 2021-09-16 DIAGNOSIS — Z0000 Encounter for general adult medical examination without abnormal findings: Secondary | ICD-10-CM

## 2021-09-16 NOTE — Progress Notes (Signed)
Subjective:   Darrell Marshall is a 86 y.o. male who presents for Medicare Annual/Subsequent preventive examination.  Review of Systems    NA       Objective:    There were no vitals filed for this visit. There is no height or weight on file to calculate BMI.  Advanced Directives 01/13/2020 12/30/2019 06/27/2019 05/25/2017 01/26/2016 01/12/2016  Does Patient Have a Medical Advance Directive? _0  No  Would patient like information on creating a medical advance directive? No - Patient declined No - Patient declined No - Patient declined - No - patient declined information No - patient declined information    Current Medications (verified) Outpatient Encounter Medications as of 09/16/2021  Medication Sig   amLODipine (NORVASC) 5 MG tablet Take 1 tablet (5 mg total) by mouth daily.   aspirin 81 MG chewable tablet Chew 1 tablet (81 mg total) by mouth daily.   atorvastatin (LIPITOR) 80 MG tablet Take 1 tablet (80 mg total) by mouth daily at 6 PM.   famotidine (PEPCID) 40 MG tablet Take 40 mg by mouth daily.   hydrALAZINE (APRESOLINE) 50 MG tablet Take 50 mg by mouth 2 (two) times daily.   LANTUS SOLOSTAR 100 UNIT/ML Solostar Pen Inject 16 Units into the skin at bedtime.   leflunomide (ARAVA) 20 MG tablet Take 20 mg by mouth daily.   metoprolol succinate (TOPROL-XL) 25 MG 24 hr tablet Take 25 mg by mouth 2 (two) times daily.    Multiple Vitamins-Minerals (MULTIVITAMIN ADULTS 50+ PO) Take 1 tablet by mouth daily. Men   nitroGLYCERIN (NITROSTAT) 0.4 MG SL tablet Place 1 tablet (0.4 mg total) under the tongue every 5 (five) minutes x 3 doses as needed for chest pain.   triamcinolone ointment (KENALOG) 0.5 % Apply 1 application topically 2 (two) times daily. As needed   vitamin B-12 (CYANOCOBALAMIN) 500 MCG tablet Take 1,000 mcg by mouth daily.    No facility-administered encounter medications on file as of 09/16/2021.    Allergies (verified) Patient has no known allergies.    History: Past Medical History:  Diagnosis Date   Atherosclerotic heart disease of native coronary artery with unspecified angina pectoris (HCC)    CAD (coronary artery disease), native coronary artery    cath 01/12/2016 50% ost D1, 60% prox LCx, 100% distal RCA lesion treated with DES   Carpal tunnel syndrome, bilateral upper limbs    Chronic systolic (congestive) heart failure (HCC)    CKD (chronic kidney disease), stage III ([place]) 01/15/2016   Diabetes mellitus without complication ([place])    Essential (primary) hypertension    Hypertension    Hypertensive chronic kidney disease with stage 1 through stage 4 chronic kidney disease, or unspecified chronic kidney disease    Ischemic cardiomyopathy    Echo 01/13/2016 EF 40-45%   Kidney disease, chronic, stage III (moderate, EGFR 30-59 ml/min) (HCC)    Obesity, unspecified    Reflux esophagitis    Rheumatoid arthritis, unspecified (HCC)    ST elevation myocardial infarction (STEMI) of inferior wall ([place]) 01/12/2016   Subsequent ST elevation (STEMI) myocardial infarction of anterior wall (HCC)    Type 2 diabetes mellitus with diabetic nephropathy ([place])    Type 2 diabetes mellitus with unspecified complications [facility])    Past Surgical History:  Procedure Laterality Date   APPENDECTOMY     CARDIAC CATHETERIZATION N/A 01/12/2016   Procedure: Left Heart Cath and Coronary Angiography;  Surgeon: Sherren Mocha, MD;  Location: [place] [facility] [place] [facility];  Service: Cardiovascular;  Laterality: N/A;   CARDIAC CATHETERIZATION N/A 01/12/2016   Procedure: Coronary Stent Intervention;  Surgeon: Sherren Mocha, MD;  Location: [place] [facility];  Service: Cardiovascular;  Laterality: N/A;   CATARACT EXTRACTION W/PHACO Right 12/30/2019   Procedure: CATARACT EXTRACTION PHACO AND INTRAOCULAR LENS PLACEMENT ([place]);  Surgeon: Baruch Goldmann, MD;  Location: [facility];  Service: Ophthalmology;  Laterality: Right;  CDE: 4.06   CATARACT EXTRACTION W/PHACO Left 01/13/2020    Procedure: CATARACT EXTRACTION PHACO AND INTRAOCULAR LENS PLACEMENT ([facility]);  Surgeon: Baruch Goldmann, MD;  Location: [facility];  Service: Ophthalmology;  Laterality: Left;  CDE: 6.68   CORONARY STENT PLACEMENT  01/12/16   Family History  Problem Relation Age of Onset   Ovarian cancer Mother    Diabetes Mother    Hypertension Mother    Prostate cancer Father    Heart attack Neg Hx    Stroke Neg Hx    Social History   Socioeconomic History   Marital status: Widowed    Spouse name: Not on file   Number of children: Not on file   Years of education: Not on file   Highest education level: Not on file  Occupational History   Not on file  Tobacco Use   Smoking status: Never   Smokeless tobacco: Never  Vaping Use   Vaping Use: Never used  Substance and Sexual Activity   Alcohol use: No   Drug use: No   Sexual activity: Not on file  Other Topics Concern   Not on file  Social History Narrative   Not on file   Social Determinants of Health   Financial Resource Strain: Not on file  Food Insecurity: Not on file  Transportation Needs: Not on file  Physical Activity: Not on file  Stress: Not on file  Social Connections: Not on file    Tobacco Counseling Counseling given: Not Answered   Clinical Intake:                 Diabetic?Yes         Activities of Daily Living In your present state of health, do you have any difficulty performing the following activities: 07/19/2021  Hearing? N  Vision? N  Difficulty concentrating or making decisions? N  Walking or climbing stairs? N  Dressing or bathing? N  Doing errands, shopping? N  Some recent data might be hidden    Patient Care Team: Lindell Spar, MD as PCP - General (Internal Medicine) Harl Bowie Alphonse Guild, MD as PCP - Cardiology (Cardiology)  Indicate any recent Medical Services you may have received from other than [facility] providers in the past year (date may be approximate).     Assessment:   This is a  routine wellness examination for Darrell Marshall.  Hearing/Vision screen No results found.  Dietary issues and exercise activities discussed:     Goals Addressed   None   Depression Screen PHQ 2/9 Scores 07/19/2021 06/16/2016 01/26/2016  PHQ - 2 Score 0 0 1  PHQ- 9 Score - 0 1  Exception Documentation - - (No Data)    Fall Risk Fall Risk  07/19/2021 01/26/2016  Falls in the past year? 0 No  Number falls in past yr: 0 -  Injury with Fall? 0 -  Risk for fall due to : No Fall Risks (No Data)  Risk for fall due to: Comment - none  Follow up Falls evaluation completed -    FALL RISK PREVENTION PERTAINING TO THE  HOME:  Any stairs in or around the home? No  If so, are there any without handrails? No  Home free of loose throw rugs in walkways, pet beds, electrical cords, etc? No  Adequate lighting in your home to reduce risk of falls? Yes   ASSISTIVE DEVICES UTILIZED TO PREVENT FALLS:  Life alert? No  Use of a cane, Seif Teichert or w/c? No  Grab bars in the bathroom? Yes  Shower chair or bench in shower? No  Elevated toilet seat or a handicapped toilet? No   TIMED UP AND GO:  Was the test performed? No .  Length of time to ambulate 10 feet: NA sec.     Cognitive Function:        Immunizations Immunization History  Administered Date(s) Administered   Fluad Quad(high Dose 65+) 07/19/2021   Influenza Split 08/04/2011, 11/07/2012   Influenza, High Dose Seasonal PF 08/24/2018   Influenza-Unspecified 08/06/2014, 09/22/2015, 08/07/2019   Pneumococcal Conjugate-13 05/06/2014   Pneumococcal Polysaccharide-23 07/20/2016   Tdap 05/06/2014   Zoster Recombinat (Shingrix) 05/18/2018, 05/22/2018    TDAP status: Up to date  Flu Vaccine status: Up to date  Pneumococcal vaccine status: Up to date  Covid-19 vaccine status: Completed vaccines  Qualifies for Shingles Vaccine? Yes   Zostavax completed Yes   Shingrix Completed?: Yes  Screening Tests Health Maintenance  Topic Date Due    COVID-19 Vaccine (1) Never done   OPHTHALMOLOGY EXAM  Never done   Zoster Vaccines- Shingrix (2 of 2) 07/17/2018   FOOT EXAM  05/05/2020   HEMOGLOBIN A1C  01/18/2022   TETANUS/TDAP  05/06/2024   Pneumonia Vaccine 51+ Years old  Completed   INFLUENZA VACCINE  Completed   HPV VACCINES  Aged Out    Health Maintenance  Health Maintenance Due  Topic Date Due   COVID-19 Vaccine (1) Never done   OPHTHALMOLOGY EXAM  Never done   Zoster Vaccines- Shingrix (2 of 2) 07/17/2018   FOOT EXAM  05/05/2020    Colorectal cancer screening: No longer required.   Lung Cancer Screening: (Low Dose CT Chest recommended if Age 47-80 years, 30 pack-year currently smoking OR have quit w/in 15years.) does not qualify.   Lung Cancer Screening Referral: NA  Additional Screening:  Hepatitis C Screening: does qualify  Vision Screening: Recommended annual ophthalmology exams for early detection of glaucoma and other disorders of the eye. Is the patient up to date with their annual eye exam?  Yes  Who is the provider or what is the name of the office in which the patient attends annual eye exams? [facility] If pt is not established with a provider, would they like to be referred to a provider to establish care? No .   Dental Screening: Recommended annual dental exams for proper oral hygiene  Community Resource Referral / Chronic Care Management: CRR required this visit?  No   CCM required this visit?  No      Plan:     I have personally reviewed and noted the following in the patient's chart:   Medical and social history Use of alcohol, tobacco or illicit drugs  Current medications and supplements including opioid prescriptions. Patient is not currently taking opioid prescriptions. Functional ability and status Nutritional status Physical activity Advanced directives List of other physicians Hospitalizations, surgeries, and ER visits in previous 12 months Vitals Screenings to  include cognitive, depression, and falls Referrals and appointments  In addition, I have reviewed and discussed with patient certain preventive  protocols, quality metrics, and best practice recommendations. A written personalized care plan for preventive services as well as general preventive health recommendations were provided to patient.     Danella Penton, [place]   09/16/2021   Nurse Notes: This was a telehealth visit. The patient was at home, the provider was in the office. Ihor Dow, MD

## 2021-09-16 NOTE — Patient Instructions (Signed)
[name] , Thank you for taking time to come for your Medicare Wellness Visit. I appreciate your ongoing commitment to your health goals. Please review the following plan we discussed and let me know if I can assist you in the future.   Screening recommendations/referrals: Colonoscopy: No longer required Recommended yearly ophthalmology/optometry visit for glaucoma screening and checkup Recommended yearly dental visit for hygiene and checkup  Vaccinations: Influenza vaccine: Completed Pneumococcal vaccine: Completed Tdap vaccine: Completed Shingles vaccine: Completed    Advanced directives: Information provided  Conditions/risks identified: Hypertension, Diabetes  Next appointment: 1 year  Preventive Care 84 Years and Older, Male Preventive care refers to lifestyle choices and visits with your health care provider that can promote health and wellness. What does preventive care include? A yearly physical exam. This is also called an annual well check. Dental exams once or twice a year. Routine eye exams. Ask your health care provider how often you should have your eyes checked. Personal lifestyle choices, including: Daily care of your teeth and gums. Regular physical activity. Eating a healthy diet. Avoiding tobacco and drug use. Limiting alcohol use. Practicing safe sex. Taking low doses of aspirin every day. Taking vitamin and mineral supplements as recommended by your health care provider. What happens during an annual well check? The services and screenings done by your health care provider during your annual well check will depend on your age, overall health, lifestyle risk factors, and family history of disease. Counseling  Your health care provider may ask you questions about your: Alcohol use. Tobacco use. Drug use. Emotional well-being. Home and relationship well-being. Sexual activity. Eating habits. History of falls. Memory and ability to understand  (cognition). Work and work Statistician. Screening  You may have the following tests or measurements: Height, weight, and BMI. Blood pressure. Lipid and cholesterol levels. These may be checked every 5 years, or more frequently if you are over 1 years old. Skin check. Lung cancer screening. You may have this screening every year starting at age 84 if you have a 30-pack-year history of smoking and currently smoke or have quit within the past 15 years. Fecal occult blood test (FOBT) of the stool. You may have this test every year starting at age 84. Flexible sigmoidoscopy or colonoscopy. You may have a sigmoidoscopy every 5 years or a colonoscopy every 10 years starting at age 84. Prostate cancer screening. Recommendations will vary depending on your family history and other risks. Hepatitis C blood test. Hepatitis B blood test. Sexually transmitted disease (STD) testing. Diabetes screening. This is done by checking your blood sugar (glucose) after you have not eaten for a while (fasting). You may have this done every 1-3 years. Abdominal aortic aneurysm (AAA) screening. You may need this if you are a current or former smoker. Osteoporosis. You may be screened starting at age 84 if you are at high risk. Talk with your health care provider about your test results, treatment options, and if necessary, the need for more tests. Vaccines  Your health care provider may recommend certain vaccines, such as: Influenza vaccine. This is recommended every year. Tetanus, diphtheria, and acellular pertussis (Tdap, Td) vaccine. You may need a Td booster every 10 years. Zoster vaccine. You may need this after age 84. Pneumococcal 13-valent conjugate (PCV13) vaccine. One dose is recommended after age 84. Pneumococcal polysaccharide (PPSV23) vaccine. One dose is recommended after age 84. Talk to your health care provider about which screenings and vaccines you need and how often you need them.  This  information is not intended to replace advice given to you by your health care provider. Make sure you discuss any questions you have with your health care provider. Document Released: 12/04/2015 Document Revised: 07/27/2016 Document Reviewed: 09/08/2015 Elsevier Interactive Patient Education  2017 [place] Prevention in the Home Falls can cause injuries. They can happen to people of all ages. There are many things you can do to make your home safe and to help prevent falls. What can I do on the outside of my home? Regularly fix the edges of walkways and driveways and fix any cracks. Remove anything that might make you trip as you walk through a door, such as a raised step or threshold. Trim any bushes or trees on the path to your home. Use bright outdoor lighting. Clear any walking paths of anything that might make someone trip, such as rocks or tools. Regularly check to see if handrails are loose or broken. Make sure that both sides of any steps have handrails. Any raised decks and porches should have guardrails on the edges. Have any leaves, snow, or ice cleared regularly. Use sand or salt on walking paths during winter. Clean up any spills in your garage right away. This includes oil or grease spills. What can I do in the bathroom? Use night lights. Install grab bars by the toilet and in the tub and shower. Do not use towel bars as grab bars. Use non-skid mats or decals in the tub or shower. If you need to sit down in the shower, use a plastic, non-slip stool. Keep the floor dry. Clean up any water that spills on the floor as soon as it happens. Remove soap buildup in the tub or shower regularly. Attach bath mats securely with double-sided non-slip rug tape. Do not have throw rugs and other things on the floor that can make you trip. What can I do in the bedroom? Use night lights. Make sure that you have a light by your bed that is easy to reach. Do not use any sheets or  blankets that are too big for your bed. They should not hang down onto the floor. Have a firm chair that has side arms. You can use this for support while you get dressed. Do not have throw rugs and other things on the floor that can make you trip. What can I do in the kitchen? Clean up any spills right away. Avoid walking on wet floors. Keep items that you use a lot in easy-to-reach places. If you need to reach something above you, use a strong step stool that has a grab bar. Keep electrical cords out of the way. Do not use floor polish or wax that makes floors slippery. If you must use wax, use non-skid floor wax. Do not have throw rugs and other things on the floor that can make you trip. What can I do with my stairs? Do not leave any items on the stairs. Make sure that there are handrails on both sides of the stairs and use them. Fix handrails that are broken or loose. Make sure that handrails are as long as the stairways. Check any carpeting to make sure that it is firmly attached to the stairs. Fix any carpet that is loose or worn. Avoid having throw rugs at the top or bottom of the stairs. If you do have throw rugs, attach them to the floor with carpet tape. Make sure that you have a light switch at the  top of the stairs and the bottom of the stairs. If you do not have them, ask someone to add them for you. What else can I do to help prevent falls? Wear shoes that: Do not have high heels. Have rubber bottoms. Are comfortable and fit you well. Are closed at the toe. Do not wear sandals. If you use a stepladder: Make sure that it is fully opened. Do not climb a closed stepladder. Make sure that both sides of the stepladder are locked into place. Ask someone to hold it for you, if possible. Clearly mark and make sure that you can see: Any grab bars or handrails. First and last steps. Where the edge of each step is. Use tools that help you move around (mobility aids) if they are  needed. These include: Canes. Walkers. Scooters. Crutches. Turn on the lights when you go into a dark area. Replace any light bulbs as soon as they burn out. Set up your furniture so you have a clear path. Avoid moving your furniture around. If any of your floors are uneven, fix them. If there are any pets around you, be aware of where they are. Review your medicines with your doctor. Some medicines can make you feel dizzy. This can increase your chance of falling. Ask your doctor what other things that you can do to help prevent falls. This information is not intended to replace advice given to you by your health care provider. Make sure you discuss any questions you have with your health care provider. Document Released: 09/03/2009 Document Revised: 04/14/2016 Document Reviewed: 12/12/2014 Elsevier Interactive Patient Education  2017 [employer].

## 2021-09-30 DIAGNOSIS — Z23 Encounter for immunization: Secondary | ICD-10-CM | POA: Diagnosis not present

## 2021-10-07 ENCOUNTER — Other Ambulatory Visit: Payer: Self-pay | Admitting: Physician Assistant

## 2021-10-19 ENCOUNTER — Other Ambulatory Visit: Payer: Self-pay

## 2021-10-19 ENCOUNTER — Ambulatory Visit (INDEPENDENT_AMBULATORY_CARE_PROVIDER_SITE_OTHER): Payer: Medicare Other | Admitting: Internal Medicine

## 2021-10-19 ENCOUNTER — Encounter: Payer: Self-pay | Admitting: Internal Medicine

## 2021-10-19 VITALS — BP 158/78 | HR 80 | Resp 18 | Ht 68.5 in | Wt 239.1 lb

## 2021-10-19 DIAGNOSIS — L280 Lichen simplex chronicus: Secondary | ICD-10-CM | POA: Diagnosis not present

## 2021-10-19 DIAGNOSIS — I10 Essential (primary) hypertension: Secondary | ICD-10-CM

## 2021-10-19 DIAGNOSIS — I5032 Chronic diastolic (congestive) heart failure: Secondary | ICD-10-CM | POA: Diagnosis not present

## 2021-10-19 DIAGNOSIS — N1832 Chronic kidney disease, stage 3b: Secondary | ICD-10-CM | POA: Diagnosis not present

## 2021-10-19 DIAGNOSIS — Z794 Long term (current) use of insulin: Secondary | ICD-10-CM

## 2021-10-19 DIAGNOSIS — E785 Hyperlipidemia, unspecified: Secondary | ICD-10-CM | POA: Diagnosis not present

## 2021-10-19 DIAGNOSIS — E1122 Type 2 diabetes mellitus with diabetic chronic kidney disease: Secondary | ICD-10-CM | POA: Diagnosis not present

## 2021-10-19 DIAGNOSIS — E1121 Type 2 diabetes mellitus with diabetic nephropathy: Secondary | ICD-10-CM

## 2021-10-19 DIAGNOSIS — E1129 Type 2 diabetes mellitus with other diabetic kidney complication: Secondary | ICD-10-CM | POA: Diagnosis not present

## 2021-10-19 DIAGNOSIS — E211 Secondary hyperparathyroidism, not elsewhere classified: Secondary | ICD-10-CM | POA: Diagnosis not present

## 2021-10-19 DIAGNOSIS — N189 Chronic kidney disease, unspecified: Secondary | ICD-10-CM | POA: Diagnosis not present

## 2021-10-19 MED ORDER — AMLODIPINE BESYLATE 5 MG PO TABS: 5.0000 mg | ORAL_TABLET | Freq: Every day | ORAL | 1 refills | Status: DC

## 2021-10-19 MED ORDER — TRIAMCINOLONE ACETONIDE 0.5 % EX OINT: 1.0000 "application " | TOPICAL_OINTMENT | Freq: Two times a day (BID) | CUTANEOUS | 2 refills | Status: DC

## 2021-10-19 NOTE — Patient Instructions (Signed)
Please continue taking medications as prescribed.  Please continue to follow low salt diet and ambulate as tolerated.  Please get fasting blood tests done within a week. 

## 2021-10-19 NOTE — Progress Notes (Signed)
Established Patient Office Visit  Subjective:  Patient ID: Darrell Marshall, male    DOB: [birth year]  Age: 86 y.o. MRN: [id number]  CC:  Chief Complaint  Patient presents with   Follow-up    3 month follow up HTN DM and CKD blood sugars have been good this am was 127    HPI Darrell Marshall is a 86 y.o. male with past medical history of CAD s/p stent placement, HTN, type II DM with CKD, RA and HLD who  presents for f/u of his chronic medical conditions.  CAD and HTN: BP is well-controlled. Takes medications regularly. Patient denies headache, dizziness, chest pain, dyspnea or palpitations.  He follows up with cardiology for history of CAD.  He takes aspirin and statin.  He also takes metoprolol.   Type II DM: He takes Lantus 16 units at nighttime.  His blood glucose remains controlled.  His HbA1c was 6.1 today.  Denies any fatigue, polyuria, polyphagia or recent change in weight.  He also takes statin.   CKD: Follows up with nephrology.  Denies any dysuria or hematuria.  Denies any LE swelling currently.  Past Medical History:  Diagnosis Date   Atherosclerotic heart disease of native coronary artery with unspecified angina pectoris (HCC)    CAD (coronary artery disease), native coronary artery    cath 01/12/2016 50% ost D1, 60% prox LCx, 100% distal RCA lesion treated with DES   Carpal tunnel syndrome, bilateral upper limbs    Chronic systolic (congestive) heart failure (HCC)    CKD (chronic kidney disease), stage III ([place]) 01/15/2016   Diabetes mellitus without complication ([place])    Essential (primary) hypertension    Hypertension    Hypertensive chronic kidney disease with stage 1 through stage 4 chronic kidney disease, or unspecified chronic kidney disease    Ischemic cardiomyopathy    Echo 01/13/2016 EF 40-45%   Kidney disease, chronic, stage III (moderate, EGFR 30-59 ml/min) (HCC)    Obesity, unspecified    Reflux esophagitis    Rheumatoid arthritis, unspecified (HCC)    ST  elevation myocardial infarction (STEMI) of inferior wall ([place]) 01/12/2016   Subsequent ST elevation (STEMI) myocardial infarction of anterior wall (HCC)    Type 2 diabetes mellitus with diabetic nephropathy ([place])    Type 2 diabetes mellitus with unspecified complications [facility])     Past Surgical History:  Procedure Laterality Date   APPENDECTOMY     CARDIAC CATHETERIZATION N/A 01/12/2016   Procedure: Left Heart Cath and Coronary Angiography;  Surgeon: Sherren Mocha, MD;  Location: [place] [facility];  Service: Cardiovascular;  Laterality: N/A;   CARDIAC CATHETERIZATION N/A 01/12/2016   Procedure: Coronary Stent Intervention;  Surgeon: Sherren Mocha, MD;  Location: [place] [facility];  Service: Cardiovascular;  Laterality: N/A;   CATARACT EXTRACTION W/PHACO Right 12/30/2019   Procedure: CATARACT EXTRACTION PHACO AND INTRAOCULAR LENS PLACEMENT ([place]);  Surgeon: Baruch Goldmann, MD;  Location: [facility];  Service: Ophthalmology;  Laterality: Right;  CDE: 4.06   CATARACT EXTRACTION W/PHACO Left 01/13/2020   Procedure: CATARACT EXTRACTION PHACO AND INTRAOCULAR LENS PLACEMENT ([facility]);  Surgeon: Baruch Goldmann, MD;  Location: [facility];  Service: Ophthalmology;  Laterality: Left;  CDE: 6.68   CORONARY STENT PLACEMENT  01/12/16    Family History  Problem Relation Age of Onset   Ovarian cancer Mother    Diabetes Mother    Hypertension Mother    Prostate cancer Father    Heart attack Neg Hx    Stroke Neg  Hx     Social History   Socioeconomic History   Marital status: Widowed    Spouse name: Not on file   Number of children: Not on file   Years of education: Not on file   Highest education level: Not on file  Occupational History   Not on file  Tobacco Use   Smoking status: Never   Smokeless tobacco: Never  Vaping Use   Vaping Use: Never used  Substance and Sexual Activity   Alcohol use: No   Drug use: No   Sexual activity: Not on file  Other Topics Concern   Not on file  Social History  Narrative   Not on file   Social Determinants of Health   Financial Resource Strain: Low Risk    Difficulty of Paying Living Expenses: Not hard at all  Food Insecurity: No Food Insecurity   Worried About Running Out of Food in the Last Year: Never true   [place] in the Last Year: Never true  Transportation Needs: No Transportation Needs   Lack of Transportation (Medical): No   Lack of Transportation (Non-Medical): No  Physical Activity: Sufficiently Active   Days of Exercise per Week: 7 days   Minutes of Exercise per Session: 60 min  Stress: No Stress Concern Present   Feeling of Stress : Not at all  Social Connections: Moderately Isolated   Frequency of Communication with Friends and Family: Twice a week   Frequency of Social Gatherings with Friends and Family: Twice a week   Attends Religious Services: More than 4 times per year   Active Member of [employer] or Organizations: No   Attends Archivist Meetings: Never   Marital Status: Widowed  Human resources officer Violence: Not At Risk   Fear of Current or Ex-Partner: No   Emotionally Abused: No   Physically Abused: No   Sexually Abused: No    Outpatient Medications Prior to Visit  Medication Sig Dispense Refill   aspirin 81 MG chewable tablet Chew 1 tablet (81 mg total) by mouth daily.     atorvastatin (LIPITOR) 80 MG tablet Take 1 tablet (80 mg total) by mouth daily at 6 PM. 90 tablet 3   famotidine (PEPCID) 40 MG tablet Take 40 mg by mouth daily.     hydrALAZINE (APRESOLINE) 50 MG tablet Take 50 mg by mouth 2 (two) times daily.     LANTUS SOLOSTAR 100 UNIT/ML Solostar Pen Inject 16 Units into the skin at bedtime.     leflunomide (ARAVA) 20 MG tablet Take 20 mg by mouth daily.     metoprolol succinate (TOPROL-XL) 25 MG 24 hr tablet Take 25 mg by mouth 2 (two) times daily.   3   Multiple Vitamins-Minerals (MULTIVITAMIN ADULTS 50+ PO) Take 1 tablet by mouth daily. Men     nitroGLYCERIN (NITROSTAT) 0.4 MG SL tablet  Place 1 tablet (0.4 mg total) under the tongue every 5 (five) minutes x 3 doses as needed for chest pain. 25 tablet 3   vitamin B-12 (CYANOCOBALAMIN) 500 MCG tablet Take 1,000 mcg by mouth daily.      amLODipine (NORVASC) 5 MG tablet TAKE 1 TABLET BY MOUTH EVERY DAY 30 tablet 0   triamcinolone ointment (KENALOG) 0.5 % Apply 1 application topically 2 (two) times daily. As needed     No facility-administered medications prior to visit.    Allergies  Allergen Reactions   Losartan Potassium Other (See Comments)    ROS Review of Systems  Constitutional:  Negative for chills and fever.  HENT:  Negative for congestion and sore throat.   Eyes:  Negative for pain and discharge.  Respiratory:  Negative for cough and shortness of breath.   Cardiovascular:  Negative for chest pain and palpitations.  Gastrointestinal:  Negative for constipation, diarrhea, nausea and vomiting.  Endocrine: Negative for polydipsia and polyuria.  Genitourinary:  Negative for dysuria and hematuria.  Musculoskeletal:  Negative for neck pain and neck stiffness.  Skin:  Positive for rash.  Neurological:  Negative for dizziness, weakness, numbness and headaches.  Psychiatric/Behavioral:  Negative for agitation and behavioral problems.      Objective:    Physical Exam Vitals reviewed.  Constitutional:      General: He is not in acute distress.    Appearance: He is not diaphoretic.  HENT:     Head: Normocephalic and atraumatic.     Nose: Nose normal.     Mouth/Throat:     Mouth: Mucous membranes are moist.  Eyes:     General: No scleral icterus.    Extraocular Movements: Extraocular movements intact.  Cardiovascular:     Rate and Rhythm: Normal rate and regular rhythm.     Pulses: Normal pulses.     Heart sounds: Normal heart sounds. No murmur heard. Pulmonary:     Breath sounds: Normal breath sounds. No wheezing or rales.  Abdominal:     Palpations: Abdomen is soft.     Tenderness: There is no abdominal  tenderness.  Musculoskeletal:     Cervical back: Neck supple. No tenderness.     Right lower leg: No edema.     Left lower leg: No edema.  Skin:    General: Skin is warm.     Findings: Rash (Lichenified eczema over left leg near ankle) present.  Neurological:     General: No focal deficit present.     Mental Status: He is alert and oriented to person, place, and time.     Sensory: No sensory deficit.     Motor: No weakness.  Psychiatric:        Mood and Affect: Mood normal.        Behavior: Behavior normal.    BP (!) 158/78 (BP Location: Left Arm, Patient Position: Sitting, Cuff Size: Normal)   Pulse 80   Resp 18   Ht 5' 8.5" (1.74 m)   Wt 239 lb 1.9 oz (108.5 kg)   SpO2 97%   BMI 35.83 kg/m  Wt Readings from Last 3 Encounters:  10/19/21 239 lb 1.9 oz (108.5 kg)  07/19/21 231 lb 1.9 oz (104.8 kg)  03/22/21 233 lb (105.7 kg)    No results found for: TSH Lab Results  Component Value Date   WBC 6.8 06/27/2019   HGB 12.5 (L) 06/27/2019   HCT 42.4 06/27/2019   MCV 71.7 (L) 06/27/2019   PLT 220 06/27/2019   Lab Results  Component Value Date   NA 140 10/07/2019   K 4.0 10/07/2019   CO2 23 (A) 10/07/2019   GLUCOSE 529 (HH) 06/27/2019   BUN 21 10/07/2019   CREATININE 1.8 (A) 10/07/2019   ALKPHOS 173 (A) 05/06/2019   AST 21 05/06/2019   ALT 21 05/06/2019   CALCIUM 9.0 10/07/2019   ANIONGAP 11 06/27/2019   Lab Results  Component Value Date   CHOL 84 10/19/2021   Lab Results  Component Value Date   HDL 33 (L) 10/19/2021   Lab Results  Component Value Date   LDLCALC  25 10/19/2021   Lab Results  Component Value Date   TRIG 180 (H) 10/19/2021   Lab Results  Component Value Date   CHOLHDL 2.5 10/19/2021   Lab Results  Component Value Date   HGBA1C 5.9 (H) 10/19/2021      Assessment & Plan:   Problem List Items Addressed This Visit       Cardiovascular and Mediastinum   Essential (primary) hypertension    BP Readings from Last 1 Encounters:   10/19/21 (!) 158/78  Elevated today, but overall remains well-controlled with Amlodipine, Metoprolol and Hydralazine Counseled for compliance with the medications Advised DASH diet and moderate exercise/walking as tolerated      Relevant Medications   amLODipine (NORVASC) 5 MG tablet     Endocrine   Type 2 diabetes mellitus with diabetic nephropathy ([place]) - Primary    Lab Results  Component Value Date   HGBA1C 5.9 (H) 10/19/2021  On Lantus 16 units nightly Advised to follow diabetic diet On statin F/u CMP and lipid panel Diabetic eye exam: Advised to follow up with Ophthalmology for diabetic eye exam      Relevant Orders   Hemoglobin A1c (Completed)     Musculoskeletal and Integument   Lichenified eczema    Kenalog cream prescribed Advised to use lotion/moisturizer      Relevant Medications   triamcinolone ointment (KENALOG) 0.5 %     Genitourinary   CKD (chronic kidney disease), stage III ([place])    Followed by Nephrology - new referral provided as per patient request due to change in insurance provider Avoid nephrotoxic agents including NSAIDs      Relevant Orders   Ambulatory referral to Nephrology     Other   Dyslipidemia    On Lipitor Check lipid profile      Relevant Orders   Lipid Profile (Completed)    Meds ordered this encounter  Medications   triamcinolone ointment (KENALOG) 0.5 %    Sig: Apply 1 application topically 2 (two) times daily. As needed    Dispense:  30 g    Refill:  2   amLODipine (NORVASC) 5 MG tablet    Sig: Take 1 tablet (5 mg total) by mouth daily.    Dispense:  90 tablet    Refill:  1    Follow-up: Return in about 4 months (around 02/16/2022) for HTN, DM and CKD.    Lindell Spar, MD

## 2021-10-20 LAB — LIPID PANEL
Cholesterol: 84 mg/dL (ref <200)
HDL: 33 mg/dL — ABNORMAL LOW (ref >=40)
LDL Cholesterol (Calc): 25 mg/dL (calc)
Non-HDL Cholesterol (Calc): 51 mg/dL (calc) (ref <130)
Total CHOL/HDL Ratio: 2.5 (calc) (ref <5.0)
Triglycerides: 180 mg/dL — ABNORMAL HIGH (ref <150)

## 2021-10-20 LAB — HEMOGLOBIN A1C
Hgb A1c MFr Bld: 5.9 % of total Hgb — ABNORMAL HIGH (ref <5.7)
Mean Plasma Glucose: 123 mg/dL
eAG (mmol/L): 6.8 mmol/L

## 2021-10-21 DIAGNOSIS — L280 Lichen simplex chronicus: Secondary | ICD-10-CM | POA: Insufficient documentation

## 2021-10-21 NOTE — Assessment & Plan Note (Signed)
Lab Results  Component Value Date   HGBA1C 5.9 (H) 10/19/2021   On Lantus 16 units nightly Advised to follow diabetic diet On statin F/u CMP and lipid panel Diabetic eye exam: Advised to follow up with Ophthalmology for diabetic eye exam

## 2021-10-21 NOTE — Assessment & Plan Note (Signed)
Kenalog cream prescribed Advised to use lotion/moisturizer

## 2021-10-21 NOTE — Assessment & Plan Note (Signed)
On Lipitor Check lipid profile 

## 2021-10-21 NOTE — Assessment & Plan Note (Signed)
BP Readings from Last 1 Encounters:  10/19/21 (!) 158/78   Elevated today, but overall remains well-controlled with Amlodipine, Metoprolol and Hydralazine Counseled for compliance with the medications Advised DASH diet and moderate exercise/walking as tolerated

## 2021-10-21 NOTE — Assessment & Plan Note (Signed)
Followed by Nephrology - new referral provided as per patient request due to change in insurance provider Avoid nephrotoxic agents including NSAIDs

## 2021-10-28 ENCOUNTER — Telehealth: Payer: Self-pay

## 2021-10-28 NOTE — Telephone Encounter (Signed)
Per clinic nurse I called [facility] spoke to [facility] she will fax over a request.

## 2021-10-28 NOTE — Telephone Encounter (Signed)
Shalon called from [facility], nurse case manager needs most recent office notes med list, labs, diabetic notes needs to be faxed.call back # [phone].  fax # [phone] attn [place].

## 2021-10-28 NOTE — Telephone Encounter (Signed)
Can she fax [place] a request for these records?

## 2021-10-29 DIAGNOSIS — I129 Hypertensive chronic kidney disease with stage 1 through stage 4 chronic kidney disease, or unspecified chronic kidney disease: Secondary | ICD-10-CM | POA: Diagnosis not present

## 2021-10-29 DIAGNOSIS — E1129 Type 2 diabetes mellitus with other diabetic kidney complication: Secondary | ICD-10-CM | POA: Diagnosis not present

## 2021-10-29 DIAGNOSIS — R809 Proteinuria, unspecified: Secondary | ICD-10-CM | POA: Diagnosis not present

## 2021-10-29 DIAGNOSIS — N189 Chronic kidney disease, unspecified: Secondary | ICD-10-CM | POA: Diagnosis not present

## 2021-10-29 DIAGNOSIS — I5032 Chronic diastolic (congestive) heart failure: Secondary | ICD-10-CM | POA: Diagnosis not present

## 2021-10-29 DIAGNOSIS — E211 Secondary hyperparathyroidism, not elsewhere classified: Secondary | ICD-10-CM | POA: Diagnosis not present

## 2021-10-29 DIAGNOSIS — E1122 Type 2 diabetes mellitus with diabetic chronic kidney disease: Secondary | ICD-10-CM | POA: Diagnosis not present

## 2021-10-29 DIAGNOSIS — D638 Anemia in other chronic diseases classified elsewhere: Secondary | ICD-10-CM | POA: Diagnosis not present

## 2021-11-24 DIAGNOSIS — M0609 Rheumatoid arthritis without rheumatoid factor, multiple sites: Secondary | ICD-10-CM | POA: Diagnosis not present

## 2021-11-24 DIAGNOSIS — D8481 Immunodeficiency due to conditions classified elsewhere: Secondary | ICD-10-CM | POA: Diagnosis not present

## 2021-11-24 DIAGNOSIS — I10 Essential (primary) hypertension: Secondary | ICD-10-CM | POA: Diagnosis not present

## 2021-11-24 DIAGNOSIS — E1139 Type 2 diabetes mellitus with other diabetic ophthalmic complication: Secondary | ICD-10-CM | POA: Diagnosis not present

## 2021-11-24 DIAGNOSIS — E1122 Type 2 diabetes mellitus with diabetic chronic kidney disease: Secondary | ICD-10-CM | POA: Diagnosis not present

## 2021-11-24 DIAGNOSIS — E1165 Type 2 diabetes mellitus with hyperglycemia: Secondary | ICD-10-CM | POA: Diagnosis not present

## 2021-11-24 DIAGNOSIS — E261 Secondary hyperaldosteronism: Secondary | ICD-10-CM | POA: Diagnosis not present

## 2021-11-24 DIAGNOSIS — E785 Hyperlipidemia, unspecified: Secondary | ICD-10-CM | POA: Diagnosis not present

## 2021-11-24 DIAGNOSIS — Z794 Long term (current) use of insulin: Secondary | ICD-10-CM | POA: Diagnosis not present

## 2021-11-24 DIAGNOSIS — E1169 Type 2 diabetes mellitus with other specified complication: Secondary | ICD-10-CM | POA: Diagnosis not present

## 2021-11-24 DIAGNOSIS — E669 Obesity, unspecified: Secondary | ICD-10-CM | POA: Diagnosis not present

## 2021-11-24 DIAGNOSIS — D84821 Immunodeficiency due to drugs: Secondary | ICD-10-CM | POA: Diagnosis not present

## 2021-11-25 DIAGNOSIS — E119 Type 2 diabetes mellitus without complications: Secondary | ICD-10-CM | POA: Diagnosis not present

## 2021-12-28 DIAGNOSIS — I5032 Chronic diastolic (congestive) heart failure: Secondary | ICD-10-CM | POA: Diagnosis not present

## 2021-12-28 DIAGNOSIS — E1122 Type 2 diabetes mellitus with diabetic chronic kidney disease: Secondary | ICD-10-CM | POA: Diagnosis not present

## 2021-12-28 DIAGNOSIS — I129 Hypertensive chronic kidney disease with stage 1 through stage 4 chronic kidney disease, or unspecified chronic kidney disease: Secondary | ICD-10-CM | POA: Diagnosis not present

## 2021-12-28 DIAGNOSIS — R809 Proteinuria, unspecified: Secondary | ICD-10-CM | POA: Diagnosis not present

## 2021-12-28 DIAGNOSIS — E1129 Type 2 diabetes mellitus with other diabetic kidney complication: Secondary | ICD-10-CM | POA: Diagnosis not present

## 2021-12-28 DIAGNOSIS — D638 Anemia in other chronic diseases classified elsewhere: Secondary | ICD-10-CM | POA: Diagnosis not present

## 2021-12-28 DIAGNOSIS — E211 Secondary hyperparathyroidism, not elsewhere classified: Secondary | ICD-10-CM | POA: Diagnosis not present

## 2021-12-28 DIAGNOSIS — N189 Chronic kidney disease, unspecified: Secondary | ICD-10-CM | POA: Diagnosis not present

## 2022-01-03 DIAGNOSIS — E1122 Type 2 diabetes mellitus with diabetic chronic kidney disease: Secondary | ICD-10-CM | POA: Diagnosis not present

## 2022-01-03 DIAGNOSIS — I129 Hypertensive chronic kidney disease with stage 1 through stage 4 chronic kidney disease, or unspecified chronic kidney disease: Secondary | ICD-10-CM | POA: Diagnosis not present

## 2022-01-03 DIAGNOSIS — R809 Proteinuria, unspecified: Secondary | ICD-10-CM | POA: Diagnosis not present

## 2022-01-03 DIAGNOSIS — E211 Secondary hyperparathyroidism, not elsewhere classified: Secondary | ICD-10-CM | POA: Diagnosis not present

## 2022-01-03 DIAGNOSIS — D638 Anemia in other chronic diseases classified elsewhere: Secondary | ICD-10-CM | POA: Diagnosis not present

## 2022-01-03 DIAGNOSIS — E1129 Type 2 diabetes mellitus with other diabetic kidney complication: Secondary | ICD-10-CM | POA: Diagnosis not present

## 2022-01-03 DIAGNOSIS — I5032 Chronic diastolic (congestive) heart failure: Secondary | ICD-10-CM | POA: Diagnosis not present

## 2022-01-03 DIAGNOSIS — N189 Chronic kidney disease, unspecified: Secondary | ICD-10-CM | POA: Diagnosis not present

## 2022-02-09 DIAGNOSIS — Z6833 Body mass index (BMI) 33.0-33.9, adult: Secondary | ICD-10-CM | POA: Diagnosis not present

## 2022-02-09 DIAGNOSIS — Z79899 Other long term (current) drug therapy: Secondary | ICD-10-CM | POA: Diagnosis not present

## 2022-02-09 DIAGNOSIS — N1831 Chronic kidney disease, stage 3a: Secondary | ICD-10-CM | POA: Diagnosis not present

## 2022-02-09 DIAGNOSIS — E669 Obesity, unspecified: Secondary | ICD-10-CM | POA: Diagnosis not present

## 2022-02-09 DIAGNOSIS — M0609 Rheumatoid arthritis without rheumatoid factor, multiple sites: Secondary | ICD-10-CM | POA: Diagnosis not present

## 2022-02-09 DIAGNOSIS — R21 Rash and other nonspecific skin eruption: Secondary | ICD-10-CM | POA: Diagnosis not present

## 2022-02-09 DIAGNOSIS — D649 Anemia, unspecified: Secondary | ICD-10-CM | POA: Diagnosis not present

## 2022-02-16 ENCOUNTER — Encounter: Payer: Self-pay | Admitting: Internal Medicine

## 2022-02-16 ENCOUNTER — Ambulatory Visit (INDEPENDENT_AMBULATORY_CARE_PROVIDER_SITE_OTHER): Payer: PPO | Admitting: Internal Medicine

## 2022-02-16 ENCOUNTER — Other Ambulatory Visit: Payer: Self-pay

## 2022-02-16 VITALS — BP 128/84 | HR 80 | Ht 68.5 in | Wt 227.6 lb

## 2022-02-16 DIAGNOSIS — M069 Rheumatoid arthritis, unspecified: Secondary | ICD-10-CM | POA: Diagnosis not present

## 2022-02-16 DIAGNOSIS — Z794 Long term (current) use of insulin: Secondary | ICD-10-CM

## 2022-02-16 DIAGNOSIS — E785 Hyperlipidemia, unspecified: Secondary | ICD-10-CM

## 2022-02-16 DIAGNOSIS — I10 Essential (primary) hypertension: Secondary | ICD-10-CM

## 2022-02-16 DIAGNOSIS — I5032 Chronic diastolic (congestive) heart failure: Secondary | ICD-10-CM | POA: Diagnosis not present

## 2022-02-16 DIAGNOSIS — N1832 Chronic kidney disease, stage 3b: Secondary | ICD-10-CM | POA: Diagnosis not present

## 2022-02-16 DIAGNOSIS — E1121 Type 2 diabetes mellitus with diabetic nephropathy: Secondary | ICD-10-CM

## 2022-02-16 NOTE — Assessment & Plan Note (Signed)
Lab Results  ?Component Value Date  ? HGBA1C 5.9 (H) 10/19/2021  ? ?On Lantus 16 units nightly ?On Jardiance now for CKD and HFpEF ?Decreased dose of Lantus to 12 U qHS to avoid hypoglycemia ?Advised to follow diabetic diet ?On statin ?Diabetic eye exam: Advised to follow up with Ophthalmology for diabetic eye exam ?

## 2022-02-16 NOTE — Assessment & Plan Note (Signed)
Followed by Nephrology - Dr. Theador Hawthorne ?On Lasix and Jardiance ?Avoid nephrotoxic agents including NSAIDs ?

## 2022-02-16 NOTE — Progress Notes (Signed)
? ?Established Patient Office Visit ? ?Subjective:  ?Patient ID: Darrell Marshall, male    DOB: [birth year]  Age: 87 y.o. MRN: [id number] ? ?CC:  ?Chief Complaint  ?Patient presents with  ? Follow-up  ?  No health concerns.   ? Hypertension  ? Diabetes  ? ? ?HPI ?Darrell Marshall is a 87 y.o. male with past medical history of CAD s/p stent placement, HTN, type II DM with CKD, RA and HLD who presents for f/u of his chronic medical conditions. ? ?CAD and HTN: BP is well-controlled. Takes medications regularly. Patient denies headache, dizziness, chest pain, dyspnea or palpitations.  He follows up with cardiology for history of CAD.  He takes aspirin and statin.  He also takes metoprolol. ?  ?Type II DM: He takes Lantus 16 units at nighttime. He is also placed on Jardiance for CKD. His blood glucose remains controlled.  His last HbA1c was 5.9..  Denies any fatigue, polyuria, polyphagia or recent change in weight.  He also takes statin. ?  ?CKD: Follows up with nephrology.  Denies any dysuria or hematuria. Denies any LE swelling currently. ? ? ? ? ?Past Medical History:  ?Diagnosis Date  ? Atherosclerotic heart disease of native coronary artery with unspecified angina pectoris ([place])   ? CAD (coronary artery disease), native coronary artery   ? cath 01/12/2016 50% ost D1, 60% prox LCx, 100% distal RCA lesion treated with DES  ? Carpal tunnel syndrome, bilateral upper limbs   ? Chronic systolic (congestive) heart failure (HCC)   ? CKD (chronic kidney disease), stage III ([place]) 01/15/2016  ? Diabetes mellitus without complication ([place])   ? Essential (primary) hypertension   ? Hypertension   ? Hypertensive chronic kidney disease with stage 1 through stage 4 chronic kidney disease, or unspecified chronic kidney disease   ? Ischemic cardiomyopathy   ? Echo 01/13/2016 EF 40-45%  ? Kidney disease, chronic, stage III (moderate, EGFR 30-59 ml/min) (HCC)   ? Obesity, unspecified   ? Reflux esophagitis   ? Rheumatoid arthritis, unspecified  ([place])   ? ST elevation myocardial infarction (STEMI) of inferior wall ([place]) 01/12/2016  ? Subsequent ST elevation (STEMI) myocardial infarction of anterior wall ([place])   ? Type 2 diabetes mellitus with diabetic nephropathy ([place])   ? Type 2 diabetes mellitus with unspecified complications (HCC)   ? ? ?Past Surgical History:  ?Procedure Laterality Date  ? APPENDECTOMY    ? CARDIAC CATHETERIZATION N/A 01/12/2016  ? Procedure: Left Heart Cath and Coronary Angiography;  Surgeon: Sherren Mocha, MD;  Location: [place] [facility];  Service: Cardiovascular;  Laterality: N/A;  ? CARDIAC CATHETERIZATION N/A 01/12/2016  ? Procedure: Coronary Stent Intervention;  Surgeon: Sherren Mocha, MD;  Location: [place] [facility];  Service: Cardiovascular;  Laterality: N/A;  ? CATARACT EXTRACTION W/PHACO Right 12/30/2019  ? Procedure: CATARACT EXTRACTION PHACO AND INTRAOCULAR LENS PLACEMENT ([facility]);  Surgeon: Baruch Goldmann, MD;  Location: [facility];  Service: Ophthalmology;  Laterality: Right;  CDE: 4.06  ? CATARACT EXTRACTION W/PHACO Left 01/13/2020  ? Procedure: CATARACT EXTRACTION PHACO AND INTRAOCULAR LENS PLACEMENT ([facility]);  Surgeon: Baruch Goldmann, MD;  Location: [facility];  Service: Ophthalmology;  Laterality: Left;  CDE: 6.68  ? CORONARY STENT PLACEMENT  01/12/16  ? ? ?Family History  ?Problem Relation Age of Onset  ? Ovarian cancer Mother   ? Diabetes Mother   ? Hypertension Mother   ? Prostate cancer Father   ? Heart attack Neg Hx   ? Stroke  Neg Hx   ? ? ?Social History  ? ?Socioeconomic History  ? Marital status: Widowed  ?  Spouse name: Not on file  ? Number of children: Not on file  ? Years of education: Not on file  ? Highest education level: Not on file  ?Occupational History  ? Not on file  ?Tobacco Use  ? Smoking status: Never  ? Smokeless tobacco: Never  ?Vaping Use  ? Vaping Use: Never used  ?Substance and Sexual Activity  ? Alcohol use: No  ? Drug use: No  ? Sexual activity: Not on file  ?Other Topics Concern  ? Not on file  ?Social  History Narrative  ? Not on file  ? ?Social Determinants of Health  ? ?Financial Resource Strain: Low Risk   ? Difficulty of Paying Living Expenses: Not hard at all  ?Food Insecurity: No Food Insecurity  ? Worried About Charity fundraiser in the Last Year: Never true  ? Ran Out of Food in the Last Year: Never true  ?Transportation Needs: No Transportation Needs  ? Lack of Transportation (Medical): No  ? Lack of Transportation (Non-Medical): No  ?Physical Activity: Sufficiently Active  ? Days of Exercise per Week: 7 days  ? Minutes of Exercise per Session: 60 min  ?Stress: No Stress Concern Present  ? Feeling of Stress : Not at all  ?Social Connections: Moderately Isolated  ? Frequency of Communication with Friends and Family: Twice a week  ? Frequency of Social Gatherings with Friends and Family: Twice a week  ? Attends Religious Services: More than 4 times per year  ? Active Member of Clubs or Organizations: No  ? Attends Archivist Meetings: Never  ? Marital Status: Widowed  ?Intimate Partner Violence: Not At Risk  ? Fear of Current or Ex-Partner: No  ? Emotionally Abused: No  ? Physically Abused: No  ? Sexually Abused: No  ? ? ?Outpatient Medications Prior to Visit  ?Medication Sig Dispense Refill  ? amLODipine (NORVASC) 5 MG tablet Take 1 tablet (5 mg total) by mouth daily. 90 tablet 1  ? aspirin 81 MG chewable tablet Chew 1 tablet (81 mg total) by mouth daily.    ? atorvastatin (LIPITOR) 80 MG tablet Take 1 tablet (80 mg total) by mouth daily at 6 PM. 90 tablet 3  ? empagliflozin (JARDIANCE) 10 MG TABS tablet Take by mouth.    ? famotidine (PEPCID) 40 MG tablet Take 40 mg by mouth daily.    ? hydrALAZINE (APRESOLINE) 50 MG tablet Take 50 mg by mouth 2 (two) times daily.    ? LANTUS SOLOSTAR 100 UNIT/ML Solostar Pen Inject 12 Units into the skin at bedtime.    ? leflunomide (ARAVA) 20 MG tablet Take 20 mg by mouth daily.    ? metoprolol succinate (TOPROL-XL) 25 MG 24 hr tablet Take 25 mg by mouth 2  (two) times daily.   3  ? Multiple Vitamins-Minerals (MULTIVITAMIN ADULTS 50+ PO) Take 1 tablet by mouth daily. Men    ? nitroGLYCERIN (NITROSTAT) 0.4 MG SL tablet Place 1 tablet (0.4 mg total) under the tongue every 5 (five) minutes x 3 doses as needed for chest pain. 25 tablet 3  ? triamcinolone ointment (KENALOG) 0.5 % Apply 1 application topically 2 (two) times daily. As needed 30 g 2  ? vitamin B-12 (CYANOCOBALAMIN) 500 MCG tablet Take 1,000 mcg by mouth daily.     ? ?No facility-administered medications prior to visit.  ? ? ?Allergies  ?  Allergen Reactions  ? Losartan Potassium Other (See Comments)  ? ? ?ROS ?Review of Systems  ?Constitutional:  Negative for chills and fever.  ?HENT:  Negative for congestion and sore throat.   ?Eyes:  Negative for pain and discharge.  ?Respiratory:  Negative for cough and shortness of breath.   ?Cardiovascular:  Negative for chest pain and palpitations.  ?Gastrointestinal:  Negative for constipation, diarrhea, nausea and vomiting.  ?Endocrine: Negative for polydipsia and polyuria.  ?Genitourinary:  Negative for dysuria and hematuria.  ?Musculoskeletal:  Positive for arthralgias. Negative for neck pain and neck stiffness.  ?Skin:  Positive for rash.  ?Neurological:  Negative for dizziness, weakness, numbness and headaches.  ?Psychiatric/Behavioral:  Negative for agitation and behavioral problems.   ? ?  ?Objective:  ?  ?Physical Exam ?Vitals reviewed.  ?Constitutional:   ?   General: He is not in acute distress. ?   Appearance: He is not diaphoretic.  ?HENT:  ?   Head: Normocephalic and atraumatic.  ?   Nose: Nose normal.  ?   Mouth/Throat:  ?   Mouth: Mucous membranes are moist.  ?Eyes:  ?   General: No scleral icterus. ?   Extraocular Movements: Extraocular movements intact.  ?Cardiovascular:  ?   Rate and Rhythm: Normal rate and regular rhythm.  ?   Pulses: Normal pulses.  ?   Heart sounds: Normal heart sounds. No murmur heard. ?Pulmonary:  ?   Breath sounds: Normal breath  sounds. No wheezing or rales.  ?Abdominal:  ?   Palpations: Abdomen is soft.  ?   Tenderness: There is no abdominal tenderness.  ?Musculoskeletal:  ?   Cervical back: Neck supple. No tenderness.  ?   [place]

## 2022-02-16 NOTE — Assessment & Plan Note (Signed)
Euvolemic currently ?No LE edema or dyspnea currently ?

## 2022-02-16 NOTE — Patient Instructions (Signed)
Please start taking Lantus 12 U instead of 16 U at nighttime. ?Please contact us if blood glucose is more than 300 or less than 70. ? ?Please continue taking other medications as prescribed. ? ?Please continue to follow low carb diet and ambulate as tolerated. ?

## 2022-02-16 NOTE — Assessment & Plan Note (Signed)
BP Readings from Last 1 Encounters:  ?02/16/22 128/84  ? ?Overall remains well-controlled with Amlodipine, Metoprolol and Hydralazine ?Counseled for compliance with the medications ?Advised DASH diet and moderate exercise/walking as tolerated ?

## 2022-02-16 NOTE — Assessment & Plan Note (Signed)
On Arava Followed by Rheumatology 

## 2022-02-16 NOTE — Assessment & Plan Note (Addendum)
On Lipitor 

## 2022-02-24 ENCOUNTER — Other Ambulatory Visit: Payer: Self-pay | Admitting: Internal Medicine

## 2022-03-09 DIAGNOSIS — E1122 Type 2 diabetes mellitus with diabetic chronic kidney disease: Secondary | ICD-10-CM | POA: Diagnosis not present

## 2022-03-09 DIAGNOSIS — E211 Secondary hyperparathyroidism, not elsewhere classified: Secondary | ICD-10-CM | POA: Diagnosis not present

## 2022-03-09 DIAGNOSIS — N189 Chronic kidney disease, unspecified: Secondary | ICD-10-CM | POA: Diagnosis not present

## 2022-03-09 DIAGNOSIS — I5032 Chronic diastolic (congestive) heart failure: Secondary | ICD-10-CM | POA: Diagnosis not present

## 2022-03-09 DIAGNOSIS — E1129 Type 2 diabetes mellitus with other diabetic kidney complication: Secondary | ICD-10-CM | POA: Diagnosis not present

## 2022-03-09 DIAGNOSIS — I129 Hypertensive chronic kidney disease with stage 1 through stage 4 chronic kidney disease, or unspecified chronic kidney disease: Secondary | ICD-10-CM | POA: Diagnosis not present

## 2022-03-09 DIAGNOSIS — D638 Anemia in other chronic diseases classified elsewhere: Secondary | ICD-10-CM | POA: Diagnosis not present

## 2022-03-09 DIAGNOSIS — R809 Proteinuria, unspecified: Secondary | ICD-10-CM | POA: Diagnosis not present

## 2022-03-16 DIAGNOSIS — E211 Secondary hyperparathyroidism, not elsewhere classified: Secondary | ICD-10-CM | POA: Diagnosis not present

## 2022-03-16 DIAGNOSIS — R809 Proteinuria, unspecified: Secondary | ICD-10-CM | POA: Diagnosis not present

## 2022-03-16 DIAGNOSIS — I129 Hypertensive chronic kidney disease with stage 1 through stage 4 chronic kidney disease, or unspecified chronic kidney disease: Secondary | ICD-10-CM | POA: Diagnosis not present

## 2022-03-16 DIAGNOSIS — N189 Chronic kidney disease, unspecified: Secondary | ICD-10-CM | POA: Diagnosis not present

## 2022-03-16 DIAGNOSIS — E1129 Type 2 diabetes mellitus with other diabetic kidney complication: Secondary | ICD-10-CM | POA: Diagnosis not present

## 2022-03-16 DIAGNOSIS — E1122 Type 2 diabetes mellitus with diabetic chronic kidney disease: Secondary | ICD-10-CM | POA: Diagnosis not present

## 2022-03-16 DIAGNOSIS — D638 Anemia in other chronic diseases classified elsewhere: Secondary | ICD-10-CM | POA: Diagnosis not present

## 2022-03-16 DIAGNOSIS — I5032 Chronic diastolic (congestive) heart failure: Secondary | ICD-10-CM | POA: Diagnosis not present

## 2022-04-30 ENCOUNTER — Other Ambulatory Visit: Payer: Self-pay | Admitting: Internal Medicine

## 2022-04-30 DIAGNOSIS — I10 Essential (primary) hypertension: Secondary | ICD-10-CM

## 2022-05-11 ENCOUNTER — Other Ambulatory Visit: Payer: Self-pay | Admitting: Internal Medicine

## 2022-05-20 DIAGNOSIS — R809 Proteinuria, unspecified: Secondary | ICD-10-CM | POA: Diagnosis not present

## 2022-05-20 DIAGNOSIS — E211 Secondary hyperparathyroidism, not elsewhere classified: Secondary | ICD-10-CM | POA: Diagnosis not present

## 2022-05-20 DIAGNOSIS — D638 Anemia in other chronic diseases classified elsewhere: Secondary | ICD-10-CM | POA: Diagnosis not present

## 2022-05-20 DIAGNOSIS — I129 Hypertensive chronic kidney disease with stage 1 through stage 4 chronic kidney disease, or unspecified chronic kidney disease: Secondary | ICD-10-CM | POA: Diagnosis not present

## 2022-05-20 DIAGNOSIS — N189 Chronic kidney disease, unspecified: Secondary | ICD-10-CM | POA: Diagnosis not present

## 2022-05-20 DIAGNOSIS — E1129 Type 2 diabetes mellitus with other diabetic kidney complication: Secondary | ICD-10-CM | POA: Diagnosis not present

## 2022-05-20 DIAGNOSIS — I5032 Chronic diastolic (congestive) heart failure: Secondary | ICD-10-CM | POA: Diagnosis not present

## 2022-05-20 DIAGNOSIS — E1122 Type 2 diabetes mellitus with diabetic chronic kidney disease: Secondary | ICD-10-CM | POA: Diagnosis not present

## 2022-05-26 DIAGNOSIS — E1129 Type 2 diabetes mellitus with other diabetic kidney complication: Secondary | ICD-10-CM | POA: Diagnosis not present

## 2022-05-26 DIAGNOSIS — I129 Hypertensive chronic kidney disease with stage 1 through stage 4 chronic kidney disease, or unspecified chronic kidney disease: Secondary | ICD-10-CM | POA: Diagnosis not present

## 2022-05-26 DIAGNOSIS — N189 Chronic kidney disease, unspecified: Secondary | ICD-10-CM | POA: Diagnosis not present

## 2022-05-26 DIAGNOSIS — E1122 Type 2 diabetes mellitus with diabetic chronic kidney disease: Secondary | ICD-10-CM | POA: Diagnosis not present

## 2022-05-26 DIAGNOSIS — E211 Secondary hyperparathyroidism, not elsewhere classified: Secondary | ICD-10-CM | POA: Diagnosis not present

## 2022-05-26 DIAGNOSIS — D638 Anemia in other chronic diseases classified elsewhere: Secondary | ICD-10-CM | POA: Diagnosis not present

## 2022-05-26 DIAGNOSIS — I5032 Chronic diastolic (congestive) heart failure: Secondary | ICD-10-CM | POA: Diagnosis not present

## 2022-05-26 DIAGNOSIS — R809 Proteinuria, unspecified: Secondary | ICD-10-CM | POA: Diagnosis not present

## 2022-06-21 ENCOUNTER — Encounter: Payer: Self-pay | Admitting: Internal Medicine

## 2022-06-21 ENCOUNTER — Ambulatory Visit (INDEPENDENT_AMBULATORY_CARE_PROVIDER_SITE_OTHER): Payer: HMO | Admitting: Internal Medicine

## 2022-06-21 VITALS — BP 124/74 | HR 91 | Resp 16 | Ht 68.5 in | Wt 223.6 lb

## 2022-06-21 DIAGNOSIS — I5032 Chronic diastolic (congestive) heart failure: Secondary | ICD-10-CM

## 2022-06-21 DIAGNOSIS — I10 Essential (primary) hypertension: Secondary | ICD-10-CM

## 2022-06-21 DIAGNOSIS — N184 Chronic kidney disease, stage 4 (severe): Secondary | ICD-10-CM | POA: Diagnosis not present

## 2022-06-21 DIAGNOSIS — E1121 Type 2 diabetes mellitus with diabetic nephropathy: Secondary | ICD-10-CM

## 2022-06-21 DIAGNOSIS — Z794 Long term (current) use of insulin: Secondary | ICD-10-CM

## 2022-06-21 DIAGNOSIS — M0609 Rheumatoid arthritis without rheumatoid factor, multiple sites: Secondary | ICD-10-CM | POA: Diagnosis not present

## 2022-06-21 LAB — POCT GLYCOSYLATED HEMOGLOBIN (HGB A1C)
HbA1c POC (<> result, manual entry): 5.9 % (ref 4.0–5.6)
HbA1c, POC (controlled diabetic range): 5.9 % (ref 0.0–7.0)
HbA1c, POC (prediabetic range): 5.9 % (ref 5.7–6.4)

## 2022-06-21 NOTE — Patient Instructions (Addendum)
Please continue taking medications as prescribed.  Please continue to follow low carb diet and ambulate as tolerated. 

## 2022-06-21 NOTE — Progress Notes (Unsigned)
Established Patient Office Visit  Subjective:  Patient ID: Darrell Marshall, male    DOB: Oct 06, 1935  Age: 87 y.o. MRN: [id number]  CC:  Chief Complaint  Patient presents with   Follow-up    4 month follow up DM and HFpEF    HPI Darrell Marshall is a 87 y.o. male with past medical history of CAD s/p stent placement, HTN, type II DM with CKD, RA and HLD who presents for f/u of his chronic medical conditions.  CAD and HTN: BP is well-controlled. Takes medications regularly. Patient denies headache, dizziness, chest pain, dyspnea or palpitations.  He follows up with cardiology for history of CAD.  He takes aspirin and statin.  He also takes metoprolol.   Type II DM: He takes Lantus 16 units at nighttime. He is also placed on Jardiance for CKD. His blood glucose remains controlled.  His last HbA1c was 5.9..  Denies any fatigue, polyuria, polyphagia or recent change in weight.  He also takes statin.   CKD: Follows up with nephrology.  Denies any dysuria or hematuria. Denies any LE swelling currently.     Past Medical History:  Diagnosis Date   Atherosclerotic heart disease of native coronary artery with unspecified angina pectoris (HCC)    CAD (coronary artery disease), native coronary artery    cath 01/12/2016 50% ost D1, 60% prox LCx, 100% distal RCA lesion treated with DES   Carpal tunnel syndrome, bilateral upper limbs    Chronic systolic (congestive) heart failure (HCC)    CKD (chronic kidney disease), stage III ([place]) 01/15/2016   Diabetes mellitus without complication ([place])    Essential (primary) hypertension    Hypertension    Hypertensive chronic kidney disease with stage 1 through stage 4 chronic kidney disease, or unspecified chronic kidney disease    Ischemic cardiomyopathy    Echo 01/13/2016 EF 40-45%   Kidney disease, chronic, stage III (moderate, EGFR 30-59 ml/min) (HCC)    Obesity, unspecified    Reflux esophagitis    Rheumatoid arthritis, unspecified (HCC)    ST  elevation myocardial infarction (STEMI) of inferior wall ([place]) 01/12/2016   Subsequent ST elevation (STEMI) myocardial infarction of anterior wall (HCC)    Type 2 diabetes mellitus with diabetic nephropathy ([place])    Type 2 diabetes mellitus with unspecified complications [facility])     Past Surgical History:  Procedure Laterality Date   APPENDECTOMY     CARDIAC CATHETERIZATION N/A 01/12/2016   Procedure: Left Heart Cath and Coronary Angiography;  Surgeon: Sherren Mocha, MD;  Location: [place] [facility];  Service: Cardiovascular;  Laterality: N/A;   CARDIAC CATHETERIZATION N/A 01/12/2016   Procedure: Coronary Stent Intervention;  Surgeon: Sherren Mocha, MD;  Location: [place] [facility];  Service: Cardiovascular;  Laterality: N/A;   CATARACT EXTRACTION W/PHACO Right 12/30/2019   Procedure: CATARACT EXTRACTION PHACO AND INTRAOCULAR LENS PLACEMENT ([place]);  Surgeon: Baruch Goldmann, MD;  Location: [facility];  Service: Ophthalmology;  Laterality: Right;  CDE: 4.06   CATARACT EXTRACTION W/PHACO Left 01/13/2020   Procedure: CATARACT EXTRACTION PHACO AND INTRAOCULAR LENS PLACEMENT (IOC);  Surgeon: Baruch Goldmann, MD;  Location: [facility];  Service: Ophthalmology;  Laterality: Left;  CDE: 6.68   CORONARY STENT PLACEMENT  01/12/16    Family History  Problem Relation Age of Onset   Ovarian cancer Mother    Diabetes Mother    Hypertension Mother    Prostate cancer Father    Heart attack Neg Hx    Stroke Neg Hx  Social History   Socioeconomic History   Marital status: Widowed    Spouse name: Not on file   Number of children: Not on file   Years of education: Not on file   Highest education level: Not on file  Occupational History   Not on file  Tobacco Use   Smoking status: Never   Smokeless tobacco: Never  Vaping Use   Vaping Use: Never used  Substance and Sexual Activity   Alcohol use: No   Drug use: No   Sexual activity: Not on file  Other Topics Concern   Not on file  Social History  Narrative   Not on file   Social Determinants of Health   Financial Resource Strain: Low Risk  (09/16/2021)   Overall Financial Resource Strain (CARDIA)    Difficulty of Paying Living Expenses: Not hard at all  Food Insecurity: No Food Insecurity (09/16/2021)   Hunger Vital Sign    Worried About Running Out of Food in the Last Year: Never true    [place] in the Last Year: Never true  Transportation Needs: No Transportation Needs (09/16/2021)   PRAPARE - Hydrologist (Medical): No    Lack of Transportation (Non-Medical): No  Physical Activity: Sufficiently Active (09/16/2021)   Exercise Vital Sign    Days of Exercise per Week: 7 days    Minutes of Exercise per Session: 60 min  Stress: No Stress Concern Present (09/16/2021)   [place]    Feeling of Stress : Not at all  Social Connections: Moderately Isolated (09/16/2021)   Social Connection and Isolation Panel [NHANES]    Frequency of Communication with Friends and Family: Twice a week    Frequency of Social Gatherings with Friends and Family: Twice a week    Attends Religious Services: More than 4 times per year    Active Member of [employer] or Organizations: No    Attends Archivist Meetings: Never    Marital Status: Widowed  Intimate Partner Violence: Not At Risk (09/16/2021)   Humiliation, Afraid, Rape, and Kick questionnaire    Fear of Current or Ex-Partner: No    Emotionally Abused: No    Physically Abused: No    Sexually Abused: No    Outpatient Medications Prior to Visit  Medication Sig Dispense Refill   amLODipine (NORVASC) 5 MG tablet TAKE 1 TABLET (5 MG TOTAL) BY MOUTH DAILY. 90 tablet 1   aspirin 81 MG chewable tablet Chew 1 tablet (81 mg total) by mouth daily.     atorvastatin (LIPITOR) 80 MG tablet Take 1 tablet (80 mg total) by mouth daily at 6 PM. 90 tablet 3   BD PEN NEEDLE NANO 2ND GEN 32G X 4 MM  MISC USE TO INJECT LANTUS DAILY 100 each 5   calcitRIOL (ROCALTROL) 0.25 MCG capsule Take by mouth.     empagliflozin (JARDIANCE) 10 MG TABS tablet Take by mouth.     famotidine (PEPCID) 40 MG tablet Take 40 mg by mouth daily.     hydrALAZINE (APRESOLINE) 50 MG tablet Take 50 mg by mouth 2 (two) times daily.     LANTUS SOLOSTAR 100 UNIT/ML Solostar Pen Inject 12 Units into the skin at bedtime.     leflunomide (ARAVA) 20 MG tablet Take 20 mg by mouth daily.     metoprolol succinate (TOPROL-XL) 25 MG 24 hr tablet Take 25 mg by mouth 2 (two) times daily.  3   Multiple Vitamins-Minerals (MULTIVITAMIN ADULTS 50+ PO) Take 1 tablet by mouth daily. Men     nitroGLYCERIN (NITROSTAT) 0.4 MG SL tablet Place 1 tablet (0.4 mg total) under the tongue every 5 (five) minutes x 3 doses as needed for chest pain. 25 tablet 3   tamsulosin (FLOMAX) 0.4 MG CAPS capsule TAKE 1 CAPSULE BY MOUTH EVERY DAY 90 capsule 1   triamcinolone ointment (KENALOG) 0.5 % Apply 1 application topically 2 (two) times daily. As needed 30 g 2   vitamin B-12 (CYANOCOBALAMIN) 500 MCG tablet Take 1,000 mcg by mouth daily.      No facility-administered medications prior to visit.    Allergies  Allergen Reactions   Losartan Potassium Other (See Comments)    ROS Review of Systems    Objective:    Physical Exam  BP (!) 162/82 (BP Location: Right Arm, Patient Position: Sitting, Cuff Size: Normal)   Pulse 91   Resp 16   Ht 5' 8.5" (1.74 m)   Wt 223 lb 9.6 oz (101.4 kg)   SpO2 97%   BMI 33.50 kg/m  Wt Readings from Last 3 Encounters:  06/21/22 223 lb 9.6 oz (101.4 kg)  02/16/22 227 lb 9.6 oz (103.2 kg)  10/19/21 239 lb 1.9 oz (108.5 kg)    No results found for: "TSH" Lab Results  Component Value Date   WBC 6.8 06/27/2019   HGB 12.5 (L) 06/27/2019   HCT 42.4 06/27/2019   MCV 71.7 (L) 06/27/2019   PLT 220 06/27/2019   Lab Results  Component Value Date   NA 140 10/07/2019   K 4.0 10/07/2019   CO2 23 (A) 10/07/2019    GLUCOSE 529 (HH) 06/27/2019   BUN 21 10/07/2019   CREATININE 1.8 (A) 10/07/2019   ALKPHOS 173 (A) 05/06/2019   AST 21 05/06/2019   ALT 21 05/06/2019   CALCIUM 9.0 10/07/2019   ANIONGAP 11 06/27/2019   Lab Results  Component Value Date   CHOL 84 10/19/2021   Lab Results  Component Value Date   HDL 33 (L) 10/19/2021   Lab Results  Component Value Date   LDLCALC 25 10/19/2021   Lab Results  Component Value Date   TRIG 180 (H) 10/19/2021   Lab Results  Component Value Date   CHOLHDL 2.5 10/19/2021   Lab Results  Component Value Date   HGBA1C 5.9 (H) 10/19/2021      Assessment & Plan:   Problem List Items Addressed This Visit       Endocrine   Type 2 diabetes mellitus with diabetic nephropathy ([place]) - Primary   Relevant Orders   Microalbumin / creatinine urine ratio    No orders of the defined types were placed in this encounter.   Follow-up: No follow-ups on file.    Lindell Spar, MD

## 2022-06-22 NOTE — Assessment & Plan Note (Signed)
BP Readings from Last 1 Encounters:  06/21/22 124/74   Overall remains well-controlled with Amlodipine, Metoprolol and Hydralazine Counseled for compliance with the medications Advised DASH diet and moderate exercise/walking as tolerated

## 2022-06-22 NOTE — Assessment & Plan Note (Signed)
Euvolemic currently No LE edema or dyspnea currently

## 2022-06-22 NOTE — Assessment & Plan Note (Signed)
Lab Results  Component Value Date   HGBA1C 5.9 06/21/2022   HGBA1C 5.9 06/21/2022   HGBA1C 5.9 06/21/2022   Well-controlled On Lantus 12 units nightly On Jardiance now for CKD and HFpEF Advised to follow diabetic diet On statin Diabetic eye exam: Advised to follow up with Ophthalmology for diabetic eye exam

## 2022-06-22 NOTE — Assessment & Plan Note (Signed)
Followed by Nephrology - Dr. Theador Hawthorne On Lasix and Jardiance Avoid nephrotoxic agents including NSAIDs

## 2022-07-07 ENCOUNTER — Other Ambulatory Visit: Payer: Self-pay | Admitting: Internal Medicine

## 2022-07-28 DIAGNOSIS — E1129 Type 2 diabetes mellitus with other diabetic kidney complication: Secondary | ICD-10-CM | POA: Diagnosis not present

## 2022-07-28 DIAGNOSIS — I5032 Chronic diastolic (congestive) heart failure: Secondary | ICD-10-CM | POA: Diagnosis not present

## 2022-07-28 DIAGNOSIS — R809 Proteinuria, unspecified: Secondary | ICD-10-CM | POA: Diagnosis not present

## 2022-07-28 DIAGNOSIS — N189 Chronic kidney disease, unspecified: Secondary | ICD-10-CM | POA: Diagnosis not present

## 2022-07-28 DIAGNOSIS — I129 Hypertensive chronic kidney disease with stage 1 through stage 4 chronic kidney disease, or unspecified chronic kidney disease: Secondary | ICD-10-CM | POA: Diagnosis not present

## 2022-07-28 DIAGNOSIS — D638 Anemia in other chronic diseases classified elsewhere: Secondary | ICD-10-CM | POA: Diagnosis not present

## 2022-07-28 DIAGNOSIS — E211 Secondary hyperparathyroidism, not elsewhere classified: Secondary | ICD-10-CM | POA: Diagnosis not present

## 2022-07-28 DIAGNOSIS — E1122 Type 2 diabetes mellitus with diabetic chronic kidney disease: Secondary | ICD-10-CM | POA: Diagnosis not present

## 2022-08-03 DIAGNOSIS — N189 Chronic kidney disease, unspecified: Secondary | ICD-10-CM | POA: Diagnosis not present

## 2022-08-03 DIAGNOSIS — I5032 Chronic diastolic (congestive) heart failure: Secondary | ICD-10-CM | POA: Diagnosis not present

## 2022-08-03 DIAGNOSIS — E1122 Type 2 diabetes mellitus with diabetic chronic kidney disease: Secondary | ICD-10-CM | POA: Diagnosis not present

## 2022-08-03 DIAGNOSIS — D638 Anemia in other chronic diseases classified elsewhere: Secondary | ICD-10-CM | POA: Diagnosis not present

## 2022-08-03 DIAGNOSIS — R809 Proteinuria, unspecified: Secondary | ICD-10-CM | POA: Diagnosis not present

## 2022-08-03 DIAGNOSIS — E211 Secondary hyperparathyroidism, not elsewhere classified: Secondary | ICD-10-CM | POA: Diagnosis not present

## 2022-08-03 DIAGNOSIS — E1129 Type 2 diabetes mellitus with other diabetic kidney complication: Secondary | ICD-10-CM | POA: Diagnosis not present

## 2022-08-20 ENCOUNTER — Other Ambulatory Visit: Payer: Self-pay | Admitting: Internal Medicine

## 2022-08-23 ENCOUNTER — Other Ambulatory Visit: Payer: Self-pay | Admitting: Internal Medicine

## 2022-08-23 DIAGNOSIS — I10 Essential (primary) hypertension: Secondary | ICD-10-CM

## 2022-08-30 DIAGNOSIS — E669 Obesity, unspecified: Secondary | ICD-10-CM | POA: Diagnosis not present

## 2022-08-30 DIAGNOSIS — D649 Anemia, unspecified: Secondary | ICD-10-CM | POA: Diagnosis not present

## 2022-08-30 DIAGNOSIS — N1831 Chronic kidney disease, stage 3a: Secondary | ICD-10-CM | POA: Diagnosis not present

## 2022-08-30 DIAGNOSIS — Z6834 Body mass index (BMI) 34.0-34.9, adult: Secondary | ICD-10-CM | POA: Diagnosis not present

## 2022-08-30 DIAGNOSIS — Z79899 Other long term (current) drug therapy: Secondary | ICD-10-CM | POA: Diagnosis not present

## 2022-08-30 DIAGNOSIS — R21 Rash and other nonspecific skin eruption: Secondary | ICD-10-CM | POA: Diagnosis not present

## 2022-08-30 DIAGNOSIS — M0609 Rheumatoid arthritis without rheumatoid factor, multiple sites: Secondary | ICD-10-CM | POA: Diagnosis not present

## 2022-09-19 ENCOUNTER — Encounter: Payer: HMO | Admitting: Internal Medicine

## 2022-10-18 LAB — PROTEIN / CREATININE RATIO, URINE: Creatinine, Urine: 1246

## 2022-12-21 ENCOUNTER — Ambulatory Visit: Payer: HMO | Attending: Cardiology | Admitting: Cardiology

## 2022-12-21 ENCOUNTER — Encounter: Payer: Self-pay | Admitting: Cardiology

## 2022-12-21 VITALS — BP 142/80 | HR 64 | Ht 68.5 in | Wt 230.2 lb

## 2022-12-21 DIAGNOSIS — E669 Obesity, unspecified: Secondary | ICD-10-CM | POA: Diagnosis not present

## 2022-12-21 DIAGNOSIS — R21 Rash and other nonspecific skin eruption: Secondary | ICD-10-CM | POA: Diagnosis not present

## 2022-12-21 DIAGNOSIS — D649 Anemia, unspecified: Secondary | ICD-10-CM | POA: Diagnosis not present

## 2022-12-21 DIAGNOSIS — I2510 Atherosclerotic heart disease of native coronary artery without angina pectoris: Secondary | ICD-10-CM

## 2022-12-21 DIAGNOSIS — Z79899 Other long term (current) drug therapy: Secondary | ICD-10-CM | POA: Diagnosis not present

## 2022-12-21 DIAGNOSIS — M0609 Rheumatoid arthritis without rheumatoid factor, multiple sites: Secondary | ICD-10-CM | POA: Diagnosis not present

## 2022-12-21 DIAGNOSIS — Z6834 Body mass index (BMI) 34.0-34.9, adult: Secondary | ICD-10-CM | POA: Diagnosis not present

## 2022-12-21 DIAGNOSIS — I10 Essential (primary) hypertension: Secondary | ICD-10-CM | POA: Diagnosis not present

## 2022-12-21 DIAGNOSIS — E782 Mixed hyperlipidemia: Secondary | ICD-10-CM

## 2022-12-21 DIAGNOSIS — N184 Chronic kidney disease, stage 4 (severe): Secondary | ICD-10-CM

## 2022-12-21 DIAGNOSIS — N1831 Chronic kidney disease, stage 3a: Secondary | ICD-10-CM | POA: Diagnosis not present

## 2022-12-21 NOTE — Patient Instructions (Signed)
Medication Instructions:  Your physician recommends that you continue on your current medications as directed. Please refer to the Current Medication list given to you today.  *If you need a refill on your cardiac medications before your next appointment, please call your pharmacy*   Lab Work: None  If you have labs (blood work) drawn today and your tests are completely normal, you will receive your results only by: [place] (if you have MyChart) OR A paper copy in the mail If you have any lab test that is abnormal or we need to change your treatment, we will call you to review the results.   Testing/Procedures: None    Follow-Up: At [facility], you and your health needs are our priority.  As part of our continuing mission to provide you with exceptional heart care, we have created designated Provider Care Teams.  These Care Teams include your primary Cardiologist (physician) and Advanced Practice Providers (APPs -  Physician Assistants and Nurse Practitioners) who all work together to provide you with the care you need, when you need it.  We recommend signing up for the patient portal called "MyChart".  Sign up information is provided on this After Visit Summary.  MyChart is used to connect with patients for Virtual Visits (Telemedicine).  Patients are able to view lab/test results, encounter notes, upcoming appointments, etc.  Non-urgent messages can be sent to your provider as well.   To learn more about what you can do with MyChart, go to [email or web address].    Your next appointment:   6 month(s)  Provider:   Carlyle Dolly, MD    Other Instructions Thank you for choosing [facility]!

## 2022-12-21 NOTE — Progress Notes (Signed)
Cardiology Clinic Note   Patient Name: Darrell Marshall Date of Encounter: 12/21/2022  Primary Care Provider:  Lindell Spar, MD Primary Cardiologist:  Carlyle Dolly, MD  Patient Profile    88 year old male with history of CAD with an inferior STEMI in 12/2015, ischemic cardiomyopathy, hyperlipidemia, hypertension, CKD stage IIIb, chronic diastolic CHF, type 2 diabetes, and rheumatoid arthritis, who is here today to follow-up on his coronary artery disease and hypertension.  Past Medical History    Past Medical History:  Diagnosis Date   Atherosclerotic heart disease of native coronary artery with unspecified angina pectoris (HCC)    CAD (coronary artery disease), native coronary artery    cath 01/12/2016 50% ost D1, 60% prox LCx, 100% distal RCA lesion treated with DES   Carpal tunnel syndrome, bilateral upper limbs    Chronic systolic (congestive) heart failure (HCC)    CKD (chronic kidney disease), stage III ([place]) 01/15/2016   Diabetes mellitus without complication ([place])    Essential (primary) hypertension    Hypertension    Hypertensive chronic kidney disease with stage 1 through stage 4 chronic kidney disease, or unspecified chronic kidney disease    Ischemic cardiomyopathy    Echo 01/13/2016 EF 40-45%   Kidney disease, chronic, stage III (moderate, EGFR 30-59 ml/min) (HCC)    Obesity, unspecified    Reflux esophagitis    Rheumatoid arthritis, unspecified (HCC)    ST elevation myocardial infarction (STEMI) of inferior wall ([place]) 01/12/2016   Subsequent ST elevation (STEMI) myocardial infarction of anterior wall (HCC)    Type 2 diabetes mellitus with diabetic nephropathy ([place])    Type 2 diabetes mellitus with unspecified complications [facility])    Past Surgical History:  Procedure Laterality Date   APPENDECTOMY     CARDIAC CATHETERIZATION N/A 01/12/2016   Procedure: Left Heart Cath and Coronary Angiography;  Surgeon: Sherren Mocha, MD;  Location: [place] [facility];   Service: Cardiovascular;  Laterality: N/A;   CARDIAC CATHETERIZATION N/A 01/12/2016   Procedure: Coronary Stent Intervention;  Surgeon: Sherren Mocha, MD;  Location: [place] [facility];  Service: Cardiovascular;  Laterality: N/A;   CATARACT EXTRACTION W/PHACO Right 12/30/2019   Procedure: CATARACT EXTRACTION PHACO AND INTRAOCULAR LENS PLACEMENT ([place]);  Surgeon: Baruch Goldmann, MD;  Location: [facility];  Service: Ophthalmology;  Laterality: Right;  CDE: 4.06   CATARACT EXTRACTION W/PHACO Left 01/13/2020   Procedure: CATARACT EXTRACTION PHACO AND INTRAOCULAR LENS PLACEMENT ([facility]);  Surgeon: Baruch Goldmann, MD;  Location: [facility];  Service: Ophthalmology;  Laterality: Left;  CDE: 6.68   CORONARY STENT PLACEMENT  01/12/16    Allergies  Allergies  Allergen Reactions   Losartan Potassium Other (See Comments)    History of Present Illness    Darrell Marshall is an 88 year old male with previously mentioned past medical history of coronary artery disease with an inferior STEMI in 12/2015 treated with PCI/DES to the RCA, reperfusion complicated by VT tach which required defibrillation, echocardiogram showed LVEF of 40-45%, repeat in 11/2016 showed LVEF of 50-55% with G1 DD, echocardiogram done 04/2020 showed LVEF of 50-55%, G1 DD, and normal RV, ischemic cardiomyopathy, hyperlipidemia, hypertension, CKD stage IIIb, chronic diastolic CHF, type 2 diabetes and rheumatoid arthritis.  He was last seen in clinic 03/22/2021 by Dr. Harl Bowie.  At that time he was doing well with symptoms of chest pain or shortness of breath or decompensation.  He was slightly elevated on his blood pressures but continues to be followed by nephrology.  There were no  medication changes that were made and no further testing that was ordered.  He returns to clinic today accompanied by family member.  He states that he has been doing well.  He just recently come from his rheumatologist appointment in [place].  Denies any chest pain, shortness of  breath, peripheral edema, lightheadedness or dizziness.  He also denies any recent hospitalizations or visits to the emergency department.  Home Medications    Current Outpatient Medications  Medication Sig Dispense Refill   amLODipine (NORVASC) 5 MG tablet TAKE 1 TABLET (5 MG TOTAL) BY MOUTH DAILY. 90 tablet 1   aspirin 81 MG chewable tablet Chew 1 tablet (81 mg total) by mouth daily.     atorvastatin (LIPITOR) 80 MG tablet Take 1 tablet (80 mg total) by mouth daily at 6 PM. 90 tablet 3   BD PEN NEEDLE NANO 2ND GEN 32G X 4 MM MISC USE TO INJECT LANTUS DAILY 100 each 5   calcitRIOL (ROCALTROL) 0.25 MCG capsule Take by mouth.     empagliflozin (JARDIANCE) 10 MG TABS tablet 1 tablet Orally Once a day     famotidine (PEPCID) 40 MG tablet Take 40 mg by mouth daily.     hydrALAZINE (APRESOLINE) 50 MG tablet Take 50 mg by mouth 2 (two) times daily.     LANTUS SOLOSTAR 100 UNIT/ML Solostar Pen Inject 12 Units into the skin at bedtime.     leflunomide (ARAVA) 20 MG tablet Take 20 mg by mouth daily.     metoprolol succinate (TOPROL-XL) 50 MG 24 hr tablet TAKE 1 TABLET BY MOUTH TWICE A DAY 180 tablet 4   Multiple Vitamins-Minerals (MULTIVITAMIN ADULTS 50+ PO) Take 1 tablet by mouth daily. Men     nitroGLYCERIN (NITROSTAT) 0.4 MG SL tablet Place 1 tablet (0.4 mg total) under the tongue every 5 (five) minutes x 3 doses as needed for chest pain. 25 tablet 3   tamsulosin (FLOMAX) 0.4 MG CAPS capsule TAKE 1 CAPSULE BY MOUTH EVERY DAY 90 capsule 1   triamcinolone ointment (KENALOG) 0.5 % Apply 1 application topically 2 (two) times daily. As needed 30 g 2   vitamin B-12 (CYANOCOBALAMIN) 500 MCG tablet Take 1,000 mcg by mouth daily.      No current facility-administered medications for this visit.     Family History    Family History  Problem Relation Age of Onset   Ovarian cancer Mother    Diabetes Mother    Hypertension Mother    Prostate cancer Father    Heart attack Neg Hx    Stroke Neg Hx     He indicated that his mother is deceased. He indicated that his father is deceased. He indicated that the status of his neg hx is unknown.  Social History    Social History   Socioeconomic History   Marital status: Widowed    Spouse name: Not on file   Number of children: Not on file   Years of education: Not on file   Highest education level: Not on file  Occupational History   Not on file  Tobacco Use   Smoking status: Never   Smokeless tobacco: Never  Vaping Use   Vaping Use: Never used  Substance and Sexual Activity   Alcohol use: No   Drug use: No   Sexual activity: Not on file  Other Topics Concern   Not on file  Social History Narrative   Not on file   Social Determinants of Health   Financial Resource  Strain: Low Risk  (09/16/2021)   Overall Financial Resource Strain (CARDIA)    Difficulty of Paying Living Expenses: Not hard at all  Food Insecurity: No Food Insecurity (09/16/2021)   Hunger Vital Sign    Worried About Running Out of Food in the Last Year: Never true    Ran Out of Food in the Last Year: Never true  Transportation Needs: No Transportation Needs (09/16/2021)   PRAPARE - Hydrologist (Medical): No    Lack of Transportation (Non-Medical): No  Physical Activity: Sufficiently Active (09/16/2021)   Exercise Vital Sign    Days of Exercise per Week: 7 days    Minutes of Exercise per Session: 60 min  Stress: No Stress Concern Present (09/16/2021)   [place]    Feeling of Stress : Not at all  Social Connections: Moderately Isolated (09/16/2021)   Social Connection and Isolation Panel [NHANES]    Frequency of Communication with Friends and Family: Twice a week    Frequency of Social Gatherings with Friends and Family: Twice a week    Attends Religious Services: More than 4 times per year    Active Member of [employer] or Organizations: No    Attends Theatre manager Meetings: Never    Marital Status: Widowed  Intimate Partner Violence: Not At Risk (09/16/2021)   Humiliation, Afraid, Rape, and Kick questionnaire    Fear of Current or Ex-Partner: No    Emotionally Abused: No    Physically Abused: No    Sexually Abused: No     Review of Systems    General:  No chills, fever, night sweats or weight changes.  Cardiovascular:  No chest pain, dyspnea on exertion, edema, orthopnea, palpitations, paroxysmal nocturnal dyspnea. Dermatological: No rash, lesions/masses Respiratory: No cough, dyspnea Urologic: No hematuria, dysuria Abdominal:   No nausea, vomiting, diarrhea, bright red blood per rectum, melena, or hematemesis Neurologic:  No visual changes, wkns, changes in mental status. All other systems reviewed and are otherwise negative except as noted above.   Physical Exam    VS:  BP (!) 142/80 (BP Location: Right Arm, Patient Position: Sitting, Cuff Size: Large)   Pulse 64   Ht 5' 8.5" (1.74 m)   Wt 230 lb 3.2 oz (104.4 kg)   SpO2 100%   BMI 34.49 kg/m  , BMI Body mass index is 34.49 kg/m.     Vitals:   12/21/22 1246 12/21/22 1318  BP: (!) 144/84 (!) 142/80    GEN: Well nourished, well developed, in no acute distress. HEENT: normal. Neck: Supple, no JVD, carotid bruits, or masses. Cardiac: RRR, no murmurs, rubs, or gallops. No clubbing, cyanosis, edema.  Radials 2+/PT 2+ and equal bilaterally.  Respiratory:  Respirations regular and unlabored, clear to auscultation bilaterally. GI: Soft, nontender, nondistended, BS + x 4. MS: no deformity or atrophy. Skin: warm and dry, no rash. Neuro:  Strength and sensation are intact. Psych: Normal affect.  Accessory Clinical Findings    ECG personally reviewed by me today-sinus rhythm with a rate of 64, first-degree AV block, ST depression and T wave inversion in inferior lateral leads- No acute changes  Lab Results  Component Value Date   WBC 6.8 06/27/2019   HGB 12.5 (L)  06/27/2019   HCT 42.4 06/27/2019   MCV 71.7 (L) 06/27/2019   PLT 220 06/27/2019   Lab Results  Component Value Date   CREATININE 1.8 (A) 10/07/2019  BUN 21 10/07/2019   NA 140 10/07/2019   K 4.0 10/07/2019   CL 105 10/07/2019   CO2 23 (A) 10/07/2019   Lab Results  Component Value Date   ALT 21 05/06/2019   AST 21 05/06/2019   ALKPHOS 173 (A) 05/06/2019   Lab Results  Component Value Date   CHOL 84 10/19/2021   HDL 33 (L) 10/19/2021   LDLCALC 25 10/19/2021   TRIG 180 (H) 10/19/2021   CHOLHDL 2.5 10/19/2021    Lab Results  Component Value Date   HGBA1C 5.9 06/21/2022   HGBA1C 5.9 06/21/2022   HGBA1C 5.9 06/21/2022    Assessment & Plan   1.  Coronary artery disease with inferior STEMI in 12/2015 received PCI/DES to the RCA with reperfusion complicated by ventricular tachycardia requiring defibrillation.  He denies any chest pain or shortness of breath.  He also denies any anginal equivalents concerning for decompensation.  He is continued on aspirin 81 mg daily, and atorvastatin 80 mg daily.  He also has Nitrostat 0.4 mg sublingual as needed  2.  Hypertension with blood pressure today 144/84.  He states prior to running to this appointment he had been running in the 120s.  Continues to take his blood pressure at home.  He has been maintained on amlodipine 5 mg daily, hydralazine 50 mg twice daily, metoprolol succinate 50 mg daily.  3.  Hyperlipidemia last LDL was at goal.  He is continued on atorvastatin 80 mg daily.  This continues to be managed by his PCP.  4.  Chronic kidney disease stage IIIb.  This continues to be followed by nephrology.  5.  Disposition patient return to clinic to see MD/APP in 6 months or sooner if needed.  Umair Rosiles, NP 12/21/2022, 1:29 PM

## 2022-12-22 ENCOUNTER — Encounter: Payer: Self-pay | Admitting: Internal Medicine

## 2022-12-22 ENCOUNTER — Ambulatory Visit (INDEPENDENT_AMBULATORY_CARE_PROVIDER_SITE_OTHER): Payer: PPO | Admitting: Internal Medicine

## 2022-12-22 VITALS — BP 134/76 | HR 84 | Ht 68.5 in | Wt 232.0 lb

## 2022-12-22 DIAGNOSIS — I10 Essential (primary) hypertension: Secondary | ICD-10-CM | POA: Diagnosis not present

## 2022-12-22 DIAGNOSIS — E1121 Type 2 diabetes mellitus with diabetic nephropathy: Secondary | ICD-10-CM | POA: Diagnosis not present

## 2022-12-22 DIAGNOSIS — N184 Chronic kidney disease, stage 4 (severe): Secondary | ICD-10-CM | POA: Diagnosis not present

## 2022-12-22 DIAGNOSIS — L280 Lichen simplex chronicus: Secondary | ICD-10-CM

## 2022-12-22 DIAGNOSIS — E782 Mixed hyperlipidemia: Secondary | ICD-10-CM

## 2022-12-22 DIAGNOSIS — M069 Rheumatoid arthritis, unspecified: Secondary | ICD-10-CM

## 2022-12-22 DIAGNOSIS — Z0001 Encounter for general adult medical examination with abnormal findings: Secondary | ICD-10-CM | POA: Diagnosis not present

## 2022-12-22 DIAGNOSIS — Z794 Long term (current) use of insulin: Secondary | ICD-10-CM

## 2022-12-22 DIAGNOSIS — I5032 Chronic diastolic (congestive) heart failure: Secondary | ICD-10-CM

## 2022-12-22 DIAGNOSIS — Z23 Encounter for immunization: Secondary | ICD-10-CM | POA: Diagnosis not present

## 2022-12-22 MED ORDER — METOPROLOL SUCCINATE ER 50 MG PO TB24: 50.0000 mg | ORAL_TABLET | Freq: Every day | ORAL | 3 refills | Status: DC

## 2022-12-22 MED ORDER — HYDRALAZINE HCL 50 MG PO TABS: 50.0000 mg | ORAL_TABLET | Freq: Two times a day (BID) | ORAL | 1 refills | Status: DC

## 2022-12-22 MED ORDER — ATORVASTATIN CALCIUM 80 MG PO TABS: 80.0000 mg | ORAL_TABLET | Freq: Every day | ORAL | 3 refills | Status: DC

## 2022-12-22 MED ORDER — TRIAMCINOLONE ACETONIDE 0.5 % EX OINT: 1.0000 | TOPICAL_OINTMENT | Freq: Two times a day (BID) | CUTANEOUS | 2 refills | Status: AC

## 2022-12-22 MED ORDER — GLIPIZIDE ER 5 MG PO TB24: 5.0000 mg | ORAL_TABLET | Freq: Every day | ORAL | 1 refills | Status: DC

## 2022-12-22 NOTE — Patient Instructions (Addendum)
Please start taking Glipizide instead of insulin.  Please continue taking other medications as prescribed.  Please continue to follow low carb diet and ambulate as tolerated.  Please bring your home medications in the next visit.

## 2022-12-23 ENCOUNTER — Encounter: Payer: Self-pay | Admitting: Internal Medicine

## 2022-12-23 LAB — HEMOGLOBIN A1C
Est. average glucose Bld gHb Est-mCnc: 131 mg/dL
Hgb A1c MFr Bld: 6.2 % — ABNORMAL HIGH (ref 4.8–5.6)

## 2022-12-23 LAB — CMP14+EGFR
ALT: 16 IU/L (ref 0–44)
AST: 24 IU/L (ref 0–40)
Albumin/Globulin Ratio: 1.7 (ref 1.2–2.2)
Albumin: 4 g/dL (ref 3.7–4.7)
Alkaline Phosphatase: 100 IU/L (ref 44–121)
BUN/Creatinine Ratio: 10 (ref 10–24)
BUN: 26 mg/dL (ref 8–27)
Bilirubin Total: 0.3 mg/dL (ref 0.0–1.2)
CO2: 19 mmol/L — ABNORMAL LOW (ref 20–29)
Calcium: 9.4 mg/dL (ref 8.6–10.2)
Chloride: 105 mmol/L (ref 96–106)
Creatinine, Ser: 2.7 mg/dL — ABNORMAL HIGH (ref 0.76–1.27)
Globulin, Total: 2.4 g/dL (ref 1.5–4.5)
Glucose: 131 mg/dL — ABNORMAL HIGH (ref 70–99)
Potassium: 4.2 mmol/L (ref 3.5–5.2)
Sodium: 141 mmol/L (ref 134–144)
Total Protein: 6.4 g/dL (ref 6.0–8.5)
eGFR: 22 mL/min/{1.73_m2} — ABNORMAL LOW (ref >59)

## 2022-12-23 LAB — LIPID PANEL
Chol/HDL Ratio: 2.7 ratio (ref 0.0–5.0)
Cholesterol, Total: 88 mg/dL — ABNORMAL LOW (ref 100–199)
HDL: 33 mg/dL — ABNORMAL LOW (ref >39)
LDL Chol Calc (NIH): 34 mg/dL (ref 0–99)
Triglycerides: 115 mg/dL (ref 0–149)
VLDL Cholesterol Cal: 21 mg/dL (ref 5–40)

## 2022-12-23 LAB — TSH: TSH: 3.39 u[IU]/mL (ref 0.450–4.500)

## 2022-12-23 NOTE — Assessment & Plan Note (Addendum)
Euvolemic currently No LE edema or dyspnea currently On Jardiance

## 2022-12-23 NOTE — Assessment & Plan Note (Signed)
Followed by Nephrology - Dr. Theador Hawthorne On Jardiance Avoid nephrotoxic agents including NSAIDs

## 2022-12-23 NOTE — Assessment & Plan Note (Addendum)
Physical exam as documented. Fasting blood tests today. 

## 2022-12-23 NOTE — Assessment & Plan Note (Signed)
Kenalog cream prescribed Advised to use lotion/moisturizer

## 2022-12-23 NOTE — Assessment & Plan Note (Addendum)
Lab Results  Component Value Date   HGBA1C 5.9 06/21/2022   HGBA1C 5.9 06/21/2022   HGBA1C 5.9 06/21/2022   Well-controlled On Lantus 12 units nightly - switched to Glipizide 5 mg QD as he has very tightly controlled glycemic profile currently On Jardiance now for CKD and HFpEF Advised to follow diabetic diet On statin Diabetic eye exam: Advised to follow up with Ophthalmology for diabetic eye exam

## 2022-12-23 NOTE — Assessment & Plan Note (Signed)
On Arava Followed by Rheumatology 

## 2022-12-23 NOTE — Progress Notes (Signed)
Established Patient Office Visit  Subjective:  Patient ID: Darrell Marshall, male    DOB: 08-Mar-1935  Age: 88 y.o. MRN: [id number]  CC:  Chief Complaint  Patient presents with   Annual Exam    Patient would like to discuss changing his diabetic medications.    HPI Darrell Marshall is a 88 y.o. male with past medical history of CAD s/p stent placement, HTN, type II DM with CKD, RA and HLD who presents for annual physical.  CAD and HTN: BP is well-controlled. Takes medications regularly. Patient denies headache, dizziness, chest pain, dyspnea or palpitations.  He follows up with cardiology for history of CAD.  He takes aspirin and statin.  He also takes metoprolol.   Type II DM: He takes Lantus 16 units at nighttime, but has run out of it for the last 2 weeks. He is also placed on Jardiance for CKD. His blood glucose remains controlled around 120-140 mostly.  His last HbA1c was 5.9.  Denies any fatigue, polyuria, polyphagia or recent change in weight.  He also takes statin.   CKD: Follows up with nephrology.  Denies any dysuria or hematuria. Denies any LE swelling currently.     Past Medical History:  Diagnosis Date   Atherosclerotic heart disease of native coronary artery with unspecified angina pectoris (HCC)    CAD (coronary artery disease), native coronary artery    cath 01/12/2016 50% ost D1, 60% prox LCx, 100% distal RCA lesion treated with DES   Carpal tunnel syndrome, bilateral upper limbs    Chronic systolic (congestive) heart failure (HCC)    CKD (chronic kidney disease), stage III ([place]) 01/15/2016   Diabetes mellitus without complication ([place])    Essential (primary) hypertension    Hypertension    Hypertensive chronic kidney disease with stage 1 through stage 4 chronic kidney disease, or unspecified chronic kidney disease    Ischemic cardiomyopathy    Echo 01/13/2016 EF 40-45%   Kidney disease, chronic, stage III (moderate, EGFR 30-59 ml/min) (HCC)    Obesity, unspecified     Reflux esophagitis    Rheumatoid arthritis, unspecified (HCC)    ST elevation myocardial infarction (STEMI) of inferior wall ([place]) 01/12/2016   Subsequent ST elevation (STEMI) myocardial infarction of anterior wall (HCC)    Type 2 diabetes mellitus with diabetic nephropathy ([place])    Type 2 diabetes mellitus with unspecified complications [facility])     Past Surgical History:  Procedure Laterality Date   APPENDECTOMY     CARDIAC CATHETERIZATION N/A 01/12/2016   Procedure: Left Heart Cath and Coronary Angiography;  Surgeon: Sherren Mocha, MD;  Location: [facility];  Service: Cardiovascular;  Laterality: N/A;   CARDIAC CATHETERIZATION N/A 01/12/2016   Procedure: Coronary Stent Intervention;  Surgeon: Sherren Mocha, MD;  Location: [facility];  Service: Cardiovascular;  Laterality: N/A;   CATARACT EXTRACTION W/PHACO Right 12/30/2019   Procedure: CATARACT EXTRACTION PHACO AND INTRAOCULAR LENS PLACEMENT ([place]);  Surgeon: Baruch Goldmann, MD;  Location: [facility];  Service: Ophthalmology;  Laterality: Right;  CDE: 4.06   CATARACT EXTRACTION W/PHACO Left 01/13/2020   Procedure: CATARACT EXTRACTION PHACO AND INTRAOCULAR LENS PLACEMENT ([facility]);  Surgeon: Baruch Goldmann, MD;  Location: [facility];  Service: Ophthalmology;  Laterality: Left;  CDE: 6.68   CORONARY STENT PLACEMENT  01/12/16    Family History  Problem Relation Age of Onset   Ovarian cancer Mother    Diabetes Mother    Hypertension Mother    Prostate cancer Father  Heart attack Neg Hx    Stroke Neg Hx     Social History   Socioeconomic History   Marital status: Widowed    Spouse name: Not on file   Number of children: Not on file   Years of education: Not on file   Highest education level: Not on file  Occupational History   Not on file  Tobacco Use   Smoking status: Never   Smokeless tobacco: Never  Vaping Use   Vaping Use: Never used  Substance and Sexual Activity   Alcohol use: No   Drug use: No   Sexual activity:  Not on file  Other Topics Concern   Not on file  Social History Narrative   Not on file   Social Determinants of Health   Financial Resource Strain: Low Risk  (09/16/2021)   Overall Financial Resource Strain (CARDIA)    Difficulty of Paying Living Expenses: Not hard at all  Food Insecurity: No Food Insecurity (09/16/2021)   Hunger Vital Sign    Worried About Running Out of Food in the Last Year: Never true    [place] in the Last Year: Never true  Transportation Needs: No Transportation Needs (09/16/2021)   PRAPARE - Hydrologist (Medical): No    Lack of Transportation (Non-Medical): No  Physical Activity: Sufficiently Active (09/16/2021)   Exercise Vital Sign    Days of Exercise per Week: 7 days    Minutes of Exercise per Session: 60 min  Stress: No Stress Concern Present (09/16/2021)   [place]    Feeling of Stress : Not at all  Social Connections: Moderately Isolated (09/16/2021)   Social Connection and Isolation Panel [NHANES]    Frequency of Communication with Friends and Family: Twice a week    Frequency of Social Gatherings with Friends and Family: Twice a week    Attends Religious Services: More than 4 times per year    Active Member of [employer] or Organizations: No    Attends Archivist Meetings: Never    Marital Status: Widowed  Intimate Partner Violence: Not At Risk (09/16/2021)   Humiliation, Afraid, Rape, and Kick questionnaire    Fear of Current or Ex-Partner: No    Emotionally Abused: No    Physically Abused: No    Sexually Abused: No    Outpatient Medications Prior to Visit  Medication Sig Dispense Refill   amLODipine (NORVASC) 5 MG tablet TAKE 1 TABLET (5 MG TOTAL) BY MOUTH DAILY. 90 tablet 1   aspirin 81 MG chewable tablet Chew 1 tablet (81 mg total) by mouth daily.     BD PEN NEEDLE NANO 2ND GEN 32G X 4 MM MISC USE TO INJECT LANTUS DAILY 100  each 5   calcitRIOL (ROCALTROL) 0.25 MCG capsule Take by mouth.     empagliflozin (JARDIANCE) 10 MG TABS tablet 1 tablet Orally Once a day     famotidine (PEPCID) 40 MG tablet Take 40 mg by mouth daily.     leflunomide (ARAVA) 20 MG tablet Take 20 mg by mouth daily.     Multiple Vitamins-Minerals (MULTIVITAMIN ADULTS 50+ PO) Take 1 tablet by mouth daily. Men     nitroGLYCERIN (NITROSTAT) 0.4 MG SL tablet Place 1 tablet (0.4 mg total) under the tongue every 5 (five) minutes x 3 doses as needed for chest pain. 25 tablet 3   tamsulosin (FLOMAX) 0.4 MG CAPS capsule TAKE 1 CAPSULE  BY MOUTH EVERY DAY 90 capsule 1   vitamin B-12 (CYANOCOBALAMIN) 500 MCG tablet Take 1,000 mcg by mouth daily.      atorvastatin (LIPITOR) 80 MG tablet Take 1 tablet (80 mg total) by mouth daily at 6 PM. 90 tablet 3   hydrALAZINE (APRESOLINE) 50 MG tablet Take 50 mg by mouth 2 (two) times daily.     LANTUS SOLOSTAR 100 UNIT/ML Solostar Pen Inject 12 Units into the skin at bedtime.     metoprolol succinate (TOPROL-XL) 50 MG 24 hr tablet TAKE 1 TABLET BY MOUTH TWICE A DAY 180 tablet 4   triamcinolone ointment (KENALOG) 0.5 % Apply 1 application topically 2 (two) times daily. As needed 30 g 2   No facility-administered medications prior to visit.    Allergies  Allergen Reactions   Losartan Potassium Other (See Comments)    ROS Review of Systems  Constitutional:  Negative for chills and fever.  HENT:  Negative for congestion and sore throat.   Eyes:  Negative for pain and discharge.  Respiratory:  Negative for cough and shortness of breath.   Cardiovascular:  Negative for chest pain and palpitations.  Gastrointestinal:  Negative for constipation, diarrhea, nausea and vomiting.  Endocrine: Negative for polydipsia and polyuria.  Genitourinary:  Negative for dysuria and hematuria.  Musculoskeletal:  Positive for arthralgias. Negative for neck pain and neck stiffness.  Skin:  Positive for rash.  Neurological:  Negative  for dizziness, weakness, numbness and headaches.  Psychiatric/Behavioral:  Negative for agitation and behavioral problems.       Objective:    Physical Exam Vitals reviewed.  Constitutional:      General: He is not in acute distress.    Appearance: He is not diaphoretic.  HENT:     Head: Normocephalic and atraumatic.     Nose: Nose normal.     Mouth/Throat:     Mouth: Mucous membranes are moist.  Eyes:     General: No scleral icterus.    Extraocular Movements: Extraocular movements intact.  Cardiovascular:     Rate and Rhythm: Normal rate and regular rhythm.     Pulses: Normal pulses.     Heart sounds: Normal heart sounds. No murmur heard. Pulmonary:     Breath sounds: Normal breath sounds. No wheezing or rales.  Abdominal:     Palpations: Abdomen is soft.     Tenderness: There is no abdominal tenderness.  Musculoskeletal:     Cervical back: Neck supple. No tenderness.     Right lower leg: No edema.     Left lower leg: No edema.  Skin:    General: Skin is warm.     Findings: Rash (Lichenified eczema over left leg near ankle) present.  Neurological:     General: No focal deficit present.     Mental Status: He is alert and oriented to person, place, and time.     Sensory: No sensory deficit.     Motor: No weakness.  Psychiatric:        Mood and Affect: Mood normal.        Behavior: Behavior normal.     BP 134/76 (BP Location: Left Arm)   Pulse 84   Ht 5' 8.5" (1.74 m)   Wt 232 lb (105.2 kg)   SpO2 95%   BMI 34.76 kg/m  Wt Readings from Last 3 Encounters:  12/22/22 232 lb (105.2 kg)  12/21/22 230 lb 3.2 oz (104.4 kg)  06/21/22 223 lb 9.6 oz (101.4 kg)  No results found for: "TSH" Lab Results  Component Value Date   WBC 6.8 06/27/2019   HGB 12.5 (L) 06/27/2019   HCT 42.4 06/27/2019   MCV 71.7 (L) 06/27/2019   PLT 220 06/27/2019   Lab Results  Component Value Date   NA 140 10/07/2019   K 4.0 10/07/2019   CO2 23 (A) 10/07/2019   GLUCOSE 529 (HH)  06/27/2019   BUN 21 10/07/2019   CREATININE 1.8 (A) 10/07/2019   ALKPHOS 173 (A) 05/06/2019   AST 21 05/06/2019   ALT 21 05/06/2019   CALCIUM 9.0 10/07/2019   ANIONGAP 11 06/27/2019   Lab Results  Component Value Date   CHOL 84 10/19/2021   Lab Results  Component Value Date   HDL 33 (L) 10/19/2021   Lab Results  Component Value Date   LDLCALC 25 10/19/2021   Lab Results  Component Value Date   TRIG 180 (H) 10/19/2021   Lab Results  Component Value Date   CHOLHDL 2.5 10/19/2021   Lab Results  Component Value Date   HGBA1C 5.9 06/21/2022   HGBA1C 5.9 06/21/2022   HGBA1C 5.9 06/21/2022      Assessment & Plan:   Problem List Items Addressed This Visit       Cardiovascular and Mediastinum   Essential (primary) hypertension    BP Readings from Last 1 Encounters:  12/22/22 134/76  Overall remains well-controlled with Amlodipine, Metoprolol and Hydralazine Counseled for compliance with the medications Advised DASH diet and moderate exercise/walking as tolerated      Relevant Medications   metoprolol succinate (TOPROL-XL) 50 MG 24 hr tablet   hydrALAZINE (APRESOLINE) 50 MG tablet   atorvastatin (LIPITOR) 80 MG tablet   Other Relevant Orders   TSH   (HFpEF) heart failure with preserved ejection fraction ([place])    Euvolemic currently No LE edema or dyspnea currently On Jardiance      Relevant Medications   metoprolol succinate (TOPROL-XL) 50 MG 24 hr tablet   hydrALAZINE (APRESOLINE) 50 MG tablet   atorvastatin (LIPITOR) 80 MG tablet   Other Relevant Orders   TSH     Endocrine   Type 2 diabetes mellitus with diabetic nephropathy (HCC)    Lab Results  Component Value Date   HGBA1C 5.9 06/21/2022   HGBA1C 5.9 06/21/2022   HGBA1C 5.9 06/21/2022  Well-controlled On Lantus 12 units nightly - switched to Glipizide 5 mg QD as he has very tightly controlled glycemic profile currently On Jardiance now for CKD and HFpEF Advised to follow diabetic diet On  statin Diabetic eye exam: Advised to follow up with Ophthalmology for diabetic eye exam      Relevant Medications   glipiZIDE (GLUCOTROL XL) 5 MG 24 hr tablet   atorvastatin (LIPITOR) 80 MG tablet   Other Relevant Orders   CMP14+EGFR   Hemoglobin A1c     Musculoskeletal and Integument   Rheumatoid arthritis, unspecified (HCC)    On Arava Followed by Rheumatology      Lichenified eczema    Kenalog cream prescribed Advised to use lotion/moisturizer      Relevant Medications   triamcinolone ointment (KENALOG) 0.5 %     Genitourinary   CKD (chronic kidney disease) stage 4, GFR 15-29 ml/min (HCC)    Followed by Nephrology - Dr. Theador Hawthorne On Jardiance Avoid nephrotoxic agents including NSAIDs        Other   Encounter for general adult medical examination with abnormal findings - Primary    Physical exam as  documented. Fasting blood tests today.      Other Visit Diagnoses     Need for immunization against influenza       Relevant Orders   Flu Vaccine QUAD High Dose(Fluad) (Completed)   Mixed hyperlipidemia       Relevant Medications   metoprolol succinate (TOPROL-XL) 50 MG 24 hr tablet   hydrALAZINE (APRESOLINE) 50 MG tablet   atorvastatin (LIPITOR) 80 MG tablet   Other Relevant Orders   Lipid Profile       Meds ordered this encounter  Medications   glipiZIDE (GLUCOTROL XL) 5 MG 24 hr tablet    Sig: Take 1 tablet (5 mg total) by mouth daily with breakfast.    Dispense:  90 tablet    Refill:  1   metoprolol succinate (TOPROL-XL) 50 MG 24 hr tablet    Sig: Take 1 tablet (50 mg total) by mouth daily.    Dispense:  90 tablet    Refill:  3   hydrALAZINE (APRESOLINE) 50 MG tablet    Sig: Take 1 tablet (50 mg total) by mouth 2 (two) times daily.    Dispense:  180 tablet    Refill:  1   atorvastatin (LIPITOR) 80 MG tablet    Sig: Take 1 tablet (80 mg total) by mouth daily at 6 PM.    Dispense:  90 tablet    Refill:  3   triamcinolone ointment (KENALOG) 0.5 %     Sig: Apply 1 Application topically 2 (two) times daily. As needed    Dispense:  30 g    Refill:  2     Follow-up: Return in about 3 months (around 03/22/2023) for DM and HTN.    Lindell Spar, MD

## 2022-12-23 NOTE — Assessment & Plan Note (Signed)
BP Readings from Last 1 Encounters:  12/22/22 134/76   Overall remains well-controlled with Amlodipine, Metoprolol and Hydralazine Counseled for compliance with the medications Advised DASH diet and moderate exercise/walking as tolerated

## 2023-01-18 ENCOUNTER — Other Ambulatory Visit: Payer: Self-pay | Admitting: Internal Medicine

## 2023-01-18 DIAGNOSIS — I5032 Chronic diastolic (congestive) heart failure: Secondary | ICD-10-CM | POA: Diagnosis not present

## 2023-01-18 DIAGNOSIS — D638 Anemia in other chronic diseases classified elsewhere: Secondary | ICD-10-CM | POA: Diagnosis not present

## 2023-01-18 DIAGNOSIS — E1122 Type 2 diabetes mellitus with diabetic chronic kidney disease: Secondary | ICD-10-CM | POA: Diagnosis not present

## 2023-01-18 DIAGNOSIS — N189 Chronic kidney disease, unspecified: Secondary | ICD-10-CM | POA: Diagnosis not present

## 2023-01-18 DIAGNOSIS — E1129 Type 2 diabetes mellitus with other diabetic kidney complication: Secondary | ICD-10-CM | POA: Diagnosis not present

## 2023-01-18 DIAGNOSIS — R809 Proteinuria, unspecified: Secondary | ICD-10-CM | POA: Diagnosis not present

## 2023-01-18 DIAGNOSIS — I10 Essential (primary) hypertension: Secondary | ICD-10-CM

## 2023-01-18 DIAGNOSIS — E211 Secondary hyperparathyroidism, not elsewhere classified: Secondary | ICD-10-CM | POA: Diagnosis not present

## 2023-01-18 DIAGNOSIS — I129 Hypertensive chronic kidney disease with stage 1 through stage 4 chronic kidney disease, or unspecified chronic kidney disease: Secondary | ICD-10-CM | POA: Diagnosis not present

## 2023-01-25 DIAGNOSIS — I5032 Chronic diastolic (congestive) heart failure: Secondary | ICD-10-CM | POA: Diagnosis not present

## 2023-01-25 DIAGNOSIS — E1122 Type 2 diabetes mellitus with diabetic chronic kidney disease: Secondary | ICD-10-CM | POA: Diagnosis not present

## 2023-01-25 DIAGNOSIS — I129 Hypertensive chronic kidney disease with stage 1 through stage 4 chronic kidney disease, or unspecified chronic kidney disease: Secondary | ICD-10-CM | POA: Diagnosis not present

## 2023-01-25 DIAGNOSIS — E211 Secondary hyperparathyroidism, not elsewhere classified: Secondary | ICD-10-CM | POA: Diagnosis not present

## 2023-01-25 DIAGNOSIS — D638 Anemia in other chronic diseases classified elsewhere: Secondary | ICD-10-CM | POA: Diagnosis not present

## 2023-01-25 DIAGNOSIS — E1129 Type 2 diabetes mellitus with other diabetic kidney complication: Secondary | ICD-10-CM | POA: Diagnosis not present

## 2023-01-25 DIAGNOSIS — R809 Proteinuria, unspecified: Secondary | ICD-10-CM | POA: Diagnosis not present

## 2023-01-25 DIAGNOSIS — N189 Chronic kidney disease, unspecified: Secondary | ICD-10-CM | POA: Diagnosis not present

## 2023-02-25 ENCOUNTER — Other Ambulatory Visit: Payer: Self-pay | Admitting: Internal Medicine

## 2023-02-25 DIAGNOSIS — I10 Essential (primary) hypertension: Secondary | ICD-10-CM

## 2023-03-01 DIAGNOSIS — M0609 Rheumatoid arthritis without rheumatoid factor, multiple sites: Secondary | ICD-10-CM | POA: Diagnosis not present

## 2023-03-01 DIAGNOSIS — E669 Obesity, unspecified: Secondary | ICD-10-CM | POA: Diagnosis not present

## 2023-03-01 DIAGNOSIS — Z79899 Other long term (current) drug therapy: Secondary | ICD-10-CM | POA: Diagnosis not present

## 2023-03-01 DIAGNOSIS — Z6833 Body mass index (BMI) 33.0-33.9, adult: Secondary | ICD-10-CM | POA: Diagnosis not present

## 2023-03-01 DIAGNOSIS — D649 Anemia, unspecified: Secondary | ICD-10-CM | POA: Diagnosis not present

## 2023-03-01 DIAGNOSIS — R21 Rash and other nonspecific skin eruption: Secondary | ICD-10-CM | POA: Diagnosis not present

## 2023-03-01 DIAGNOSIS — N1831 Chronic kidney disease, stage 3a: Secondary | ICD-10-CM | POA: Diagnosis not present

## 2023-03-13 ENCOUNTER — Encounter: Payer: Self-pay | Admitting: Family Medicine

## 2023-03-13 ENCOUNTER — Encounter: Payer: HMO | Admitting: Family Medicine

## 2023-03-13 NOTE — Progress Notes (Signed)
Pt did realized had appt today and r/s for Wed 03/15/23.

## 2023-03-15 ENCOUNTER — Other Ambulatory Visit: Payer: Self-pay

## 2023-03-15 ENCOUNTER — Telehealth: Payer: Self-pay | Admitting: Internal Medicine

## 2023-03-15 ENCOUNTER — Ambulatory Visit (INDEPENDENT_AMBULATORY_CARE_PROVIDER_SITE_OTHER): Payer: HMO | Admitting: Family Medicine

## 2023-03-15 ENCOUNTER — Encounter: Payer: Self-pay | Admitting: Family Medicine

## 2023-03-15 VITALS — BP 130/69 | HR 71 | Ht 68.5 in | Wt 219.1 lb

## 2023-03-15 DIAGNOSIS — Z0000 Encounter for general adult medical examination without abnormal findings: Secondary | ICD-10-CM | POA: Diagnosis not present

## 2023-03-15 MED ORDER — BLOOD GLUCOSE MONITORING SUPPL DEVI: 1.0000 | Freq: Three times a day (TID) | 0 refills | Status: DC

## 2023-03-15 MED ORDER — LANCETS MISC. MISC: 1.0000 | Freq: Three times a day (TID) | 0 refills | Status: AC

## 2023-03-15 MED ORDER — BLOOD GLUCOSE TEST VI STRP: 1.0000 | ORAL_STRIP | Freq: Three times a day (TID) | 0 refills | Status: AC

## 2023-03-15 MED ORDER — LANCET DEVICE MISC: 1.0000 | Freq: Three times a day (TID) | 0 refills | Status: AC

## 2023-03-15 MED ORDER — LANCET DEVICE MISC: 1.0000 | Freq: Three times a day (TID) | 0 refills | Status: DC

## 2023-03-15 MED ORDER — LANCETS MISC. MISC: 1.0000 | Freq: Three times a day (TID) | 0 refills | Status: DC

## 2023-03-15 MED ORDER — BLOOD GLUCOSE TEST VI STRP: 1.0000 | ORAL_STRIP | Freq: Three times a day (TID) | 0 refills | Status: DC

## 2023-03-15 NOTE — Telephone Encounter (Signed)
Sent to [facility] 

## 2023-03-15 NOTE — Progress Notes (Addendum)
Subjective:   Darrell Marshall is a 88 y.o. male who presents for Medicare Annual/Subsequent preventive examination.  In Office Visit at [facility] visit with Randal Buba on 03/27/23  In person    Patient Location: Other:  Clinic at [facility]  Provider Location: Office/Clinic  I discussed the limitations of evaluation and management by telemedicine. The patient expressed understanding and agreed to proceed.   Review of Systems    Patient denies pain, fever, chills, chest pain, palpations ,shortness of breath, blurred vision,cough, abdominal pain, nausea, vomiting, headache, dizziness. Patient is not feeling nervous or anxious.         Objective:    There were no vitals filed for this visit. There is no height or weight on file to calculate BMI.     09/16/2021   10:02 AM 01/13/2020   10:33 AM 12/30/2019    8:39 AM 06/27/2019   11:21 AM 05/25/2017   12:00 PM 01/26/2016    4:11 PM 01/12/2016    8:51 PM  Advanced Directives  Does Patient Have a Medical Advance Directive? Yes No No No No No No  Type of Media planner of Healthcare Power of Attorney in Chart? No - copy requested        Would patient like information on creating a medical advance directive?  No - Patient declined No - Patient declined No - Patient declined  No - patient declined information No - patient declined information    Current Medications (verified) Outpatient Encounter Medications as of 03/15/2023  Medication Sig   amLODipine (NORVASC) 5 MG tablet TAKE 1 TABLET (5 MG TOTAL) BY MOUTH DAILY.   aspirin 81 MG chewable tablet Chew 1 tablet (81 mg total) by mouth daily.   atorvastatin (LIPITOR) 80 MG tablet Take 1 tablet (80 mg total) by mouth daily at 6 PM.   BD PEN NEEDLE NANO 2ND GEN 32G X 4 MM MISC USE TO INJECT LANTUS DAILY   calcitRIOL (ROCALTROL) 0.25 MCG capsule Take by mouth.   empagliflozin (JARDIANCE) 10 MG TABS  tablet 1 tablet Orally Once a day   famotidine (PEPCID) 40 MG tablet Take 40 mg by mouth daily.   glipiZIDE (GLUCOTROL XL) 5 MG 24 hr tablet Take 1 tablet (5 mg total) by mouth daily with breakfast.   hydrALAZINE (APRESOLINE) 50 MG tablet TAKE 1 TABLET BY MOUTH THREE TIMES A DAY   leflunomide (ARAVA) 20 MG tablet Take 20 mg by mouth daily.   metoprolol succinate (TOPROL-XL) 50 MG 24 hr tablet Take 1 tablet (50 mg total) by mouth daily.   Multiple Vitamins-Minerals (MULTIVITAMIN ADULTS 50+ PO) Take 1 tablet by mouth daily. Men   nitroGLYCERIN (NITROSTAT) 0.4 MG SL tablet Place 1 tablet (0.4 mg total) under the tongue every 5 (five) minutes x 3 doses as needed for chest pain.   tamsulosin (FLOMAX) 0.4 MG CAPS capsule TAKE 1 CAPSULE BY MOUTH EVERY DAY   triamcinolone ointment (KENALOG) 0.5 % Apply 1 Application topically 2 (two) times daily. As needed   vitamin B-12 (CYANOCOBALAMIN) 500 MCG tablet Take 1,000 mcg by mouth daily.    No facility-administered encounter medications on file as of 03/15/2023.    Allergies (verified) Losartan potassium   History: Past Medical History:  Diagnosis Date   Atherosclerotic heart disease of native coronary artery with unspecified angina pectoris    CAD (coronary artery disease), native coronary  artery    cath 01/12/2016 50% ost D1, 60% prox LCx, 100% distal RCA lesion treated with DES   Carpal tunnel syndrome, bilateral upper limbs    Chronic systolic (congestive) heart failure    CKD (chronic kidney disease), stage III 01/15/2016   Diabetes mellitus without complication    Essential (primary) hypertension    Hypertension    Hypertensive chronic kidney disease with stage 1 through stage 4 chronic kidney disease, or unspecified chronic kidney disease    Ischemic cardiomyopathy    Echo 01/13/2016 EF 40-45%   Kidney disease, chronic, stage III (moderate, EGFR 30-59 ml/min)    Obesity, unspecified    Reflux esophagitis    Rheumatoid arthritis, unspecified     ST elevation myocardial infarction (STEMI) of inferior wall 01/12/2016   Subsequent ST elevation (STEMI) myocardial infarction of anterior wall    Type 2 diabetes mellitus with diabetic nephropathy    Type 2 diabetes mellitus with unspecified complications    Past Surgical History:  Procedure Laterality Date   APPENDECTOMY     CARDIAC CATHETERIZATION N/A 01/12/2016   Procedure: Left Heart Cath and Coronary Angiography;  Surgeon: Tonny Bollman, MD;  Location: [facility] INVASIVE CV LAB;  Service: Cardiovascular;  Laterality: N/A;   CARDIAC CATHETERIZATION N/A 01/12/2016   Procedure: Coronary Stent Intervention;  Surgeon: Tonny Bollman, MD;  Location: [facility] INVASIVE CV LAB;  Service: Cardiovascular;  Laterality: N/A;   CATARACT EXTRACTION W/PHACO Right 12/30/2019   Procedure: CATARACT EXTRACTION PHACO AND INTRAOCULAR LENS PLACEMENT (IOC);  Surgeon: Fabio Pierce, MD;  Location: [facility];  Service: Ophthalmology;  Laterality: Right;  CDE: 4.06   CATARACT EXTRACTION W/PHACO Left 01/13/2020   Procedure: CATARACT EXTRACTION PHACO AND INTRAOCULAR LENS PLACEMENT (IOC);  Surgeon: Fabio Pierce, MD;  Location: [facility];  Service: Ophthalmology;  Laterality: Left;  CDE: 6.68   CORONARY STENT PLACEMENT  01/12/16   Family History  Problem Relation Age of Onset   Ovarian cancer Mother    Diabetes Mother    Hypertension Mother    Prostate cancer Father    Heart attack Neg Hx    Stroke Neg Hx    Social History   Socioeconomic History   Marital status: Widowed    Spouse name: Not on file   Number of children: Not on file   Years of education: Not on file   Highest education level: Not on file  Occupational History   Not on file  Tobacco Use   Smoking status: Never   Smokeless tobacco: Never  Vaping Use   Vaping Use: Never used  Substance and Sexual Activity   Alcohol use: No   Drug use: No   Sexual activity: Not on file  Other Topics Concern   Not on file  Social History Narrative   Not on file    Social Determinants of Health   Financial Resource Strain: Low Risk  (03/15/2023)   Overall Financial Resource Strain (CARDIA)    Difficulty of Paying Living Expenses: Not hard at all  Food Insecurity: No Food Insecurity (03/15/2023)   Hunger Vital Sign    Worried About Running Out of Food in the Last Year: Never true    Ran Out of Food in the Last Year: Never true  Transportation Needs: No Transportation Needs (03/15/2023)   PRAPARE - Administrator, Civil Service (Medical): No    Lack of Transportation (Non-Medical): No  Physical Activity: Sufficiently Active (03/15/2023)   Exercise Vital Sign    Days of Exercise  per Week: 7 days    Minutes of Exercise per Session: 60 min  Stress: No Stress Concern Present (03/15/2023)   [employer] of Occupational Health - Occupational Stress Questionnaire    Feeling of Stress : Not at all  Social Connections: Moderately Isolated (03/15/2023)   Social Connection and Isolation Panel [NHANES]    Frequency of Communication with Friends and Family: Twice a week    Frequency of Social Gatherings with Friends and Family: Twice a week    Attends Religious Services: More than 4 times per year    Active Member of [employer] or Organizations: No    Attends Banker Meetings: Never    Marital Status: Widowed    Tobacco Counseling Counseling given: Not Answered   Clinical Intake:                 Diabetic? Hemoglobin A1c 6.2         Activities of Daily Living     No data to display           Patient Care Team: Anabel Halon, MD as PCP - General (Internal Medicine) Wyline Mood Dorothe Pea, MD as PCP - Cardiology (Cardiology)  Indicate any recent Medical Services you may have received from other than [facility] providers in the past year (date may be approximate).     Assessment:   This is a routine wellness examination for Darrell Marshall.  Hearing/Vision screen No results found.  Dietary issues and exercise  activities discussed:  Discussed to follow a DASH diet which includes vegetables,fruits,whole grains, fat free or low fat diary,fish,poultry,beans,nuts and seeds,vegetable oils. Find an activity that you will enjoy and start to be active at least 5 days a week for 30 minutes each day.    Goals Addressed   None   Depression Screen    12/22/2022   10:28 AM 06/21/2022    9:49 AM 02/16/2022    9:28 AM 09/16/2021   10:02 AM 07/19/2021    8:49 AM 06/16/2016    2:50 PM 01/26/2016    4:16 PM  PHQ 2/9 Scores  PHQ - 2 Score 0 0 0 0 0 0 1  PHQ- 9 Score      0 1    Fall Risk    03/13/2023    1:50 PM 12/22/2022   10:28 AM 06/21/2022    9:49 AM 02/16/2022    9:28 AM 10/19/2021    9:07 AM  Fall Risk   Falls in the past year? 0 0 0 0 0  Number falls in past yr: 0 0 0 0 0  Injury with Fall? 0 0 0 0 0  Risk for fall due to : No Fall Risks  No Fall Risks No Fall Risks No Fall Risks  Follow up Falls evaluation completed  Falls evaluation completed Falls evaluation completed Falls evaluation completed    FALL RISK PREVENTION PERTAINING TO THE HOME:  Any stairs in or around the home? No  If so, are there any without handrails? No  Home free of loose throw rugs in walkways, pet beds, electrical cords, etc? Yes  Adequate lighting in your home to reduce risk of falls? Yes   ASSISTIVE DEVICES UTILIZED TO PREVENT FALLS:  Life alert? No  Use of a cane, walker or w/c? No  Grab bars in the bathroom? No  Shower chair or bench in shower? Yes  Elevated toilet seat or a handicapped toilet? No   TIMED UP AND GO:  Was the test  performed? Yes .  Length of time to ambulate 10 feet: 6 sec.   Gait slow and steady without use of assistive device  Cognitive Function:    09/16/2021   10:03 AM  MMSE - Mini Mental State Exam  Not completed: Unable to complete        03/13/2023    1:49 PM 09/16/2021   10:03 AM  6CIT Screen  What Year? 0 points 0 points  What month? 0 points 0 points  What time? 0 points 0  points  Count back from 20 0 points 0 points  Months in reverse  4 points  Repeat phrase  10 points  Total Score  14 points    Immunizations Immunization History  Administered Date(s) Administered   Fluad Quad(high Dose 65+) 07/19/2021, 12/22/2022   Influenza Split 08/04/2011, 11/07/2012   Influenza, High Dose Seasonal PF 08/24/2018   Influenza-Unspecified 08/06/2014, 09/22/2015, 08/07/2019   Moderna SARS-COV2 Booster Vaccination 09/30/2021   Moderna Sars-Covid-2 Vaccination 12/31/2019, 01/24/2020, 10/10/2020   Pneumococcal Conjugate-13 05/06/2014   Pneumococcal Polysaccharide-23 07/20/2016   Tdap 05/06/2014   Zoster Recombinat (Shingrix) 05/18/2018, 07/23/2018    TDAP status: Up to date  Flu Vaccine status: Due, Education has been provided regarding the importance of this vaccine. Advised may receive this vaccine at local pharmacy or Health Dept. Aware to provide a copy of the vaccination record if obtained from local pharmacy or Health Dept. Verbalized acceptance and understanding.  Pneumococcal vaccine status: Up to date  Covid-19 vaccine status: Information provided on how to obtain vaccines.   Qualifies for Shingles Vaccine? Yes   Zostavax completed Yes   Shingrix Completed?: Yes  Screening Tests Health Maintenance  Topic Date Due   COVID-19 Vaccine (4 - 2023-24 season) 07/22/2022   OPHTHALMOLOGY EXAM  11/25/2022   INFLUENZA VACCINE  06/22/2023   HEMOGLOBIN A1C  06/22/2023   FOOT EXAM  12/23/2023   Medicare Annual Wellness (AWV)  03/14/2024   DTaP/Tdap/Td (2 - Td or Tdap) 05/06/2024   Pneumonia Vaccine 14+ Years old  Completed   Zoster Vaccines- Shingrix  Completed   HPV VACCINES  Aged Out    Health Maintenance  Health Maintenance Due  Topic Date Due   COVID-19 Vaccine (4 - 2023-24 season) 07/22/2022   OPHTHALMOLOGY EXAM  11/25/2022    Colorectal cancer screening: No longer required.   Lung Cancer Screening: (Low Dose CT Chest recommended if Age 43-80  years, 30 pack-year currently smoking OR have quit w/in 15years.) does not qualify.   Lung Cancer Screening Referral: N/A  Additional Screening:  Hepatitis C Screening: does not qualify;   Vision Screening: Recommended annual ophthalmology exams for early detection of glaucoma and other disorders of the eye. Is the patient up to date with their annual eye exam?  Yes  Who is the provider or what is the name of the office in which the patient attends annual eye exams? [facility] If pt is not established with a provider, would they like to be referred to a provider to establish care? No .   Dental Screening: Recommended annual dental exams for proper oral hygiene  Community Resource Referral / Chronic Care Management: CRR required this visit?  No   CCM required this visit?  No      Plan:     I have personally reviewed and noted the following in the patient's chart:   Medical and social history Use of alcohol, tobacco or illicit drugs  Current medications and supplements including opioid prescriptions. Patient  is not currently taking opioid prescriptions. Functional ability and status Nutritional status Physical activity Advanced directives List of other physicians Hospitalizations, surgeries, and ER visits in previous 12 months Vitals Screenings to include cognitive, depression, and falls Referrals and appointments  In addition, I have reviewed and discussed with patient certain preventive protocols, quality metrics, and best practice recommendations. A written personalized care plan for preventive services as well as general preventive health recommendations were provided to patient.     Herbie Saxon, CMA   03/15/2023   Attestation Tenna Child Del Newman Nip, FNP                                03/15/23

## 2023-03-15 NOTE — Telephone Encounter (Signed)
Pharmacist called from [facility] said never filled test strips for patient before. New Prescription order for this.

## 2023-03-15 NOTE — Patient Instructions (Signed)
Health Maintenance, Male Adopting a healthy lifestyle and getting preventive care are important in promoting health and wellness. Ask your health care provider about: The right schedule for you to have regular tests and exams. Things you can do on your own to prevent diseases and keep yourself healthy. What should I know about diet, weight, and exercise? Eat a healthy diet  Eat a diet that includes plenty of vegetables, fruits, low-fat dairy products, and lean protein. Do not eat a lot of foods that are high in solid fats, added sugars, or sodium. Maintain a healthy weight Body mass index (BMI) is a measurement that can be used to identify possible weight problems. It estimates body fat based on height and weight. Your health care provider can help determine your BMI and help you achieve or maintain a healthy weight. Get regular exercise Get regular exercise. This is one of the most important things you can do for your health. Most adults should: Exercise for at least 150 minutes each week. The exercise should increase your heart rate and make you sweat (moderate-intensity exercise). Do strengthening exercises at least twice a week. This is in addition to the moderate-intensity exercise. Spend less time sitting. Even light physical activity can be beneficial. Watch cholesterol and blood lipids Have your blood tested for lipids and cholesterol at 86 years of age, then have this test every 5 years. You may need to have your cholesterol levels checked more often if: Your lipid or cholesterol levels are high. You are older than 86 years of age. You are at high risk for heart disease. What should I know about cancer screening? Many types of cancers can be detected early and may often be prevented. Depending on your health history and family history, you may need to have cancer screening at various ages. This may include screening for: Colorectal cancer. Prostate cancer. Skin cancer. Lung  cancer. What should I know about heart disease, diabetes, and high blood pressure? Blood pressure and heart disease High blood pressure causes heart disease and increases the risk of stroke. This is more likely to develop in people who have high blood pressure readings or are overweight. Talk with your health care provider about your target blood pressure readings. Have your blood pressure checked: Every 3-5 years if you are 18-39 years of age. Every year if you are 40 years old or older. If you are between the ages of 65 and 75 and are a current or former smoker, ask your health care provider if you should have a one-time screening for abdominal aortic aneurysm (AAA). Diabetes Have regular diabetes screenings. This checks your fasting blood sugar level. Have the screening done: Once every three years after age 45 if you are at a normal weight and have a low risk for diabetes. More often and at a younger age if you are overweight or have a high risk for diabetes. What should I know about preventing infection? Hepatitis B If you have a higher risk for hepatitis B, you should be screened for this virus. Talk with your health care provider to find out if you are at risk for hepatitis B infection. Hepatitis C Blood testing is recommended for: Everyone born from 1945 through 1965. Anyone with known risk factors for hepatitis C. Sexually transmitted infections (STIs) You should be screened each year for STIs, including gonorrhea and chlamydia, if: You are sexually active and are younger than 86 years of age. You are older than 86 years of age and your   health care provider tells you that you are at risk for this type of infection. Your sexual activity has changed since you were last screened, and you are at increased risk for chlamydia or gonorrhea. Ask your health care provider if you are at risk. Ask your health care provider about whether you are at high risk for HIV. Your health care provider  may recommend a prescription medicine to help prevent HIV infection. If you choose to take medicine to prevent HIV, you should first get tested for HIV. You should then be tested every 3 months for as long as you are taking the medicine. Follow these instructions at home: Alcohol use Do not drink alcohol if your health care provider tells you not to drink. If you drink alcohol: Limit how much you have to 0-2 drinks a day. Know how much alcohol is in your drink. In the [place] one drink equals one 12 oz bottle of beer (355 mL), one 5 oz glass of wine (148 mL), or one 1 oz glass of hard liquor (44 mL). Lifestyle Do not use any products that contain nicotine or tobacco. These products include cigarettes, chewing tobacco, and vaping devices, such as e-cigarettes. If you need help quitting, ask your health care provider. Do not use street drugs. Do not share needles. Ask your health care provider for help if you need support or information about quitting drugs. General instructions Schedule regular health, dental, and eye exams. Stay current with your vaccines. Tell your health care provider if: You often feel depressed. You have ever been abused or do not feel safe at home. Summary Adopting a healthy lifestyle and getting preventive care are important in promoting health and wellness. Follow your health care provider's instructions about healthy diet, exercising, and getting tested or screened for diseases. Follow your health care provider's instructions on monitoring your cholesterol and blood pressure. This information is not intended to replace advice given to you by your health care provider. Make sure you discuss any questions you have with your health care provider. Document Revised: 03/29/2021 Document Reviewed: 03/29/2021 Elsevier Patient Education  2023 Elsevier Inc.  

## 2023-03-16 DIAGNOSIS — E119 Type 2 diabetes mellitus without complications: Secondary | ICD-10-CM | POA: Diagnosis not present

## 2023-03-16 LAB — HM DIABETES EYE EXAM

## 2023-03-23 ENCOUNTER — Encounter: Payer: Self-pay | Admitting: Internal Medicine

## 2023-03-23 ENCOUNTER — Ambulatory Visit (INDEPENDENT_AMBULATORY_CARE_PROVIDER_SITE_OTHER): Payer: HMO | Admitting: Internal Medicine

## 2023-03-23 VITALS — BP 132/70 | HR 86 | Ht 68.5 in | Wt 220.6 lb

## 2023-03-23 DIAGNOSIS — E785 Hyperlipidemia, unspecified: Secondary | ICD-10-CM

## 2023-03-23 DIAGNOSIS — Z9861 Coronary angioplasty status: Secondary | ICD-10-CM

## 2023-03-23 DIAGNOSIS — N184 Chronic kidney disease, stage 4 (severe): Secondary | ICD-10-CM | POA: Diagnosis not present

## 2023-03-23 DIAGNOSIS — Z794 Long term (current) use of insulin: Secondary | ICD-10-CM

## 2023-03-23 DIAGNOSIS — I5032 Chronic diastolic (congestive) heart failure: Secondary | ICD-10-CM

## 2023-03-23 DIAGNOSIS — I2510 Atherosclerotic heart disease of native coronary artery without angina pectoris: Secondary | ICD-10-CM

## 2023-03-23 DIAGNOSIS — L280 Lichen simplex chronicus: Secondary | ICD-10-CM | POA: Diagnosis not present

## 2023-03-23 DIAGNOSIS — E1121 Type 2 diabetes mellitus with diabetic nephropathy: Secondary | ICD-10-CM

## 2023-03-23 LAB — POCT GLYCOSYLATED HEMOGLOBIN (HGB A1C): HbA1c, POC (controlled diabetic range): 5.4 % (ref 0.0–7.0)

## 2023-03-23 NOTE — Assessment & Plan Note (Signed)
Euvolemic currently No LE edema or dyspnea currently On Jardiance 

## 2023-03-23 NOTE — Assessment & Plan Note (Signed)
On aspirin and statin On beta-blocker Followed by cardiology 

## 2023-03-23 NOTE — Assessment & Plan Note (Signed)
Kenalog cream prescribed Advised to use lotion/moisturizer 

## 2023-03-23 NOTE — Progress Notes (Signed)
Established Patient Office Visit  Subjective:  Patient ID: Darrell Marshall, male    DOB: 1935-09-27  Age: 88 y.o. MRN: [id number]  CC:  Chief Complaint  Patient presents with   Diabetes    Follow up for diabetes and hypertension     HPI Darrell Marshall is a 88 y.o. male with past medical history of CAD s/p stent placement, HTN, type II DM with CKD, RA and HLD who presents for f/u of his chronic medical conditions.  CAD and HTN: BP is well-controlled. Takes medications regularly. Patient denies headache, dizziness, chest pain, dyspnea or palpitations.  He follows up with cardiology for history of CAD.  He takes aspirin and statin.  He also takes metoprolol.   Type II DM: He takes Glipizide 5 mg QD now. He used to take Lantus 16 units at nighttime. He is also placed on Jardiance for CKD. His blood glucose remains controlled.  His last HbA1c was 5.4 today. His blood glucose has been around 110s in the morning.  Denies any fatigue, polyuria, polyphagia or recent change in weight.  He also takes statin.   CKD: Follows up with nephrology.  Denies any dysuria or hematuria. Denies any LE swelling currently.     Past Medical History:  Diagnosis Date   Atherosclerotic heart disease of native coronary artery with unspecified angina pectoris (HCC)    CAD (coronary artery disease), native coronary artery    cath 01/12/2016 50% ost D1, 60% prox LCx, 100% distal RCA lesion treated with DES   Carpal tunnel syndrome, bilateral upper limbs    Chronic systolic (congestive) heart failure (HCC)    CKD (chronic kidney disease), stage III (HCC) 01/15/2016   Diabetes mellitus without complication (HCC)    Essential (primary) hypertension    Hypertension    Hypertensive chronic kidney disease with stage 1 through stage 4 chronic kidney disease, or unspecified chronic kidney disease    Ischemic cardiomyopathy    Echo 01/13/2016 EF 40-45%   Kidney disease, chronic, stage III (moderate, EGFR 30-59 ml/min)  (HCC)    Obesity, unspecified    Reflux esophagitis    Rheumatoid arthritis, unspecified (HCC)    ST elevation myocardial infarction (STEMI) of inferior wall (HCC) 01/12/2016   Subsequent ST elevation (STEMI) myocardial infarction of anterior wall (HCC)    Type 2 diabetes mellitus with diabetic nephropathy (HCC)    Type 2 diabetes mellitus with unspecified complications [facility])     Past Surgical History:  Procedure Laterality Date   APPENDECTOMY     CARDIAC CATHETERIZATION N/A 01/12/2016   Procedure: Left Heart Cath and Coronary Angiography;  Surgeon: Tonny Bollman, MD;  Location: [facility] INVASIVE CV LAB;  Service: Cardiovascular;  Laterality: N/A;   CARDIAC CATHETERIZATION N/A 01/12/2016   Procedure: Coronary Stent Intervention;  Surgeon: Tonny Bollman, MD;  Location: [facility] INVASIVE CV LAB;  Service: Cardiovascular;  Laterality: N/A;   CATARACT EXTRACTION W/PHACO Right 12/30/2019   Procedure: CATARACT EXTRACTION PHACO AND INTRAOCULAR LENS PLACEMENT (IOC);  Surgeon: Fabio Pierce, MD;  Location: [facility];  Service: Ophthalmology;  Laterality: Right;  CDE: 4.06   CATARACT EXTRACTION W/PHACO Left 01/13/2020   Procedure: CATARACT EXTRACTION PHACO AND INTRAOCULAR LENS PLACEMENT (IOC);  Surgeon: Fabio Pierce, MD;  Location: [facility];  Service: Ophthalmology;  Laterality: Left;  CDE: 6.68   CORONARY STENT PLACEMENT  01/12/16    Family History  Problem Relation Age of Onset   Ovarian cancer Mother    Diabetes Mother    Hypertension Mother  Prostate cancer Father    Heart attack Neg Hx    Stroke Neg Hx     Social History   Socioeconomic History   Marital status: Widowed    Spouse name: Not on file   Number of children: Not on file   Years of education: Not on file   Highest education level: Not on file  Occupational History   Not on file  Tobacco Use   Smoking status: Never   Smokeless tobacco: Never  Vaping Use   Vaping Use: Never used  Substance and Sexual Activity   Alcohol use: No    Drug use: No   Sexual activity: Not on file  Other Topics Concern   Not on file  Social History Narrative   Not on file   Social Determinants of Health   Financial Resource Strain: Low Risk  (03/15/2023)   Overall Financial Resource Strain (CARDIA)    Difficulty of Paying Living Expenses: Not hard at all  Food Insecurity: No Food Insecurity (03/15/2023)   Hunger Vital Sign    Worried About Running Out of Food in the Last Year: Never true    Ran Out of Food in the Last Year: Never true  Transportation Needs: No Transportation Needs (03/15/2023)   PRAPARE - Administrator, Civil Service (Medical): No    Lack of Transportation (Non-Medical): No  Physical Activity: Sufficiently Active (03/15/2023)   Exercise Vital Sign    Days of Exercise per Week: 7 days    Minutes of Exercise per Session: 60 min  Stress: No Stress Concern Present (03/15/2023)   [employer] of Occupational Health - Occupational Stress Questionnaire    Feeling of Stress : Not at all  Social Connections: Moderately Isolated (03/15/2023)   Social Connection and Isolation Panel [NHANES]    Frequency of Communication with Friends and Family: Twice a week    Frequency of Social Gatherings with Friends and Family: Twice a week    Attends Religious Services: More than 4 times per year    Active Member of [employer] or Organizations: No    Attends Banker Meetings: Never    Marital Status: Widowed  Intimate Partner Violence: Not At Risk (03/15/2023)   Humiliation, Afraid, Rape, and Kick questionnaire    Fear of Current or Ex-Partner: No    Emotionally Abused: No    Physically Abused: No    Sexually Abused: No    Outpatient Medications Prior to Visit  Medication Sig Dispense Refill   amLODipine (NORVASC) 5 MG tablet TAKE 1 TABLET (5 MG TOTAL) BY MOUTH DAILY. 90 tablet 1   aspirin 81 MG chewable tablet Chew 1 tablet (81 mg total) by mouth daily.     atorvastatin (LIPITOR) 80 MG tablet Take 1 tablet  (80 mg total) by mouth daily at 6 PM. 90 tablet 3   BD PEN NEEDLE NANO 2ND GEN 32G X 4 MM MISC USE TO INJECT LANTUS DAILY 100 each 5   Blood Glucose Monitoring Suppl DEVI 1 each by Does not apply route in the morning, at noon, and at bedtime. May substitute to any manufacturer covered by patient's insurance. 1 each 0   calcitRIOL (ROCALTROL) 0.25 MCG capsule Take by mouth.     empagliflozin (JARDIANCE) 10 MG TABS tablet 1 tablet Orally Once a day     famotidine (PEPCID) 40 MG tablet Take 40 mg by mouth daily.     glipiZIDE (GLUCOTROL XL) 5 MG 24 hr tablet Take 1 tablet (  5 mg total) by mouth daily with breakfast. 90 tablet 1   Glucose Blood (BLOOD GLUCOSE TEST STRIPS) STRP 1 each by In Vitro route in the morning, at noon, and at bedtime. May substitute to any manufacturer covered by patient's insurance. 100 strip 0   hydrALAZINE (APRESOLINE) 50 MG tablet TAKE 1 TABLET BY MOUTH THREE TIMES A DAY 270 tablet 2   Lancet Device MISC 1 each by Does not apply route in the morning, at noon, and at bedtime. May substitute to any manufacturer covered by patient's insurance. 1 each 0   Lancets Misc. MISC 1 each by Does not apply route in the morning, at noon, and at bedtime. May substitute to any manufacturer covered by patient's insurance. 100 each 0   leflunomide (ARAVA) 20 MG tablet Take 20 mg by mouth daily.     metoprolol succinate (TOPROL-XL) 50 MG 24 hr tablet Take 1 tablet (50 mg total) by mouth daily. 90 tablet 3   Multiple Vitamins-Minerals (MULTIVITAMIN ADULTS 50+ PO) Take 1 tablet by mouth daily. Men     nitroGLYCERIN (NITROSTAT) 0.4 MG SL tablet Place 1 tablet (0.4 mg total) under the tongue every 5 (five) minutes x 3 doses as needed for chest pain. 25 tablet 3   tamsulosin (FLOMAX) 0.4 MG CAPS capsule TAKE 1 CAPSULE BY MOUTH EVERY DAY 90 capsule 1   triamcinolone ointment (KENALOG) 0.5 % Apply 1 Application topically 2 (two) times daily. As needed 30 g 2   vitamin B-12 (CYANOCOBALAMIN) 500 MCG  tablet Take 1,000 mcg by mouth daily.      No facility-administered medications prior to visit.    Allergies  Allergen Reactions   Losartan Potassium Other (See Comments)    ROS Review of Systems  Constitutional:  Negative for chills and fever.  HENT:  Negative for congestion and sore throat.   Eyes:  Negative for pain and discharge.  Respiratory:  Negative for cough and shortness of breath.   Cardiovascular:  Negative for chest pain and palpitations.  Gastrointestinal:  Negative for constipation, diarrhea, nausea and vomiting.  Endocrine: Negative for polydipsia and polyuria.  Genitourinary:  Negative for dysuria and hematuria.  Musculoskeletal:  Positive for arthralgias. Negative for neck pain and neck stiffness.  Skin:  Positive for rash.  Neurological:  Negative for dizziness, weakness, numbness and headaches.  Psychiatric/Behavioral:  Negative for agitation and behavioral problems.       Objective:    Physical Exam Vitals reviewed.  Constitutional:      General: He is not in acute distress.    Appearance: He is not diaphoretic.  HENT:     Head: Normocephalic and atraumatic.     Nose: Nose normal.     Mouth/Throat:     Mouth: Mucous membranes are moist.  Eyes:     General: No scleral icterus.    Extraocular Movements: Extraocular movements intact.  Cardiovascular:     Rate and Rhythm: Normal rate and regular rhythm.     Pulses: Normal pulses.     Heart sounds: Normal heart sounds. No murmur heard. Pulmonary:     Breath sounds: Normal breath sounds. No wheezing or rales.  Abdominal:     Palpations: Abdomen is soft.     Tenderness: There is no abdominal tenderness.  Musculoskeletal:     Cervical back: Neck supple. No tenderness.     Right lower leg: No edema.     Left lower leg: No edema.  Skin:    General: Skin is warm.  Findings: Rash (Lichenified eczema over left leg near ankle) present.  Neurological:     General: No focal deficit present.      Mental Status: He is alert and oriented to person, place, and time.     Sensory: No sensory deficit.     Motor: No weakness.  Psychiatric:        Mood and Affect: Mood normal.        Behavior: Behavior normal.     BP 132/70 (BP Location: Left Arm)   Pulse 86   Ht 5' 8.5" (1.74 m)   Wt 220 lb 9.6 oz (100.1 kg)   SpO2 95%   BMI 33.05 kg/m  Wt Readings from Last 3 Encounters:  03/23/23 220 lb 9.6 oz (100.1 kg)  03/15/23 219 lb 1.9 oz (99.4 kg)  12/22/22 232 lb (105.2 kg)    Lab Results  Component Value Date   TSH 3.390 12/22/2022   Lab Results  Component Value Date   WBC 6.8 06/27/2019   HGB 12.5 (L) 06/27/2019   HCT 42.4 06/27/2019   MCV 71.7 (L) 06/27/2019   PLT 220 06/27/2019   Lab Results  Component Value Date   NA 141 12/22/2022   K 4.2 12/22/2022   CO2 19 (L) 12/22/2022   GLUCOSE 131 (H) 12/22/2022   BUN 26 12/22/2022   CREATININE 2.70 (H) 12/22/2022   BILITOT 0.3 12/22/2022   ALKPHOS 100 12/22/2022   AST 24 12/22/2022   ALT 16 12/22/2022   PROT 6.4 12/22/2022   ALBUMIN 4.0 12/22/2022   CALCIUM 9.4 12/22/2022   ANIONGAP 11 06/27/2019   EGFR 22 (L) 12/22/2022   Lab Results  Component Value Date   CHOL 88 (L) 12/22/2022   Lab Results  Component Value Date   HDL 33 (L) 12/22/2022   Lab Results  Component Value Date   LDLCALC 34 12/22/2022   Lab Results  Component Value Date   TRIG 115 12/22/2022   Lab Results  Component Value Date   CHOLHDL 2.7 12/22/2022   Lab Results  Component Value Date   HGBA1C 5.4 03/23/2023      Assessment & Plan:   Problem List Items Addressed This Visit       Cardiovascular and Mediastinum   CAD S/P percutaneous coronary angioplasty    On aspirin and statin On beta-blocker Followed by cardiology      (HFpEF) heart failure with preserved ejection fraction (HCC)    Euvolemic currently No LE edema or dyspnea currently On Jardiance        Endocrine   Type 2 diabetes mellitus with diabetic  nephropathy (HCC) - Primary    Lab Results  Component Value Date   HGBA1C 5.4 03/23/2023  Well-controlled On Lantus 12 units nightly - had switched to Glipizide 5 mg QD as he has very tightly controlled glycemic profile currently, plan to DC Glipizide On Jardiance now for CKD and HFpEF Advised to follow diabetic diet On statin Diabetic eye exam: Advised to follow up with Ophthalmology for diabetic eye exam      Relevant Orders   POCT glycosylated hemoglobin (Hb A1C) (Completed)     Musculoskeletal and Integument   Lichenified eczema    Kenalog cream prescribed Advised to use lotion/moisturizer        Genitourinary   CKD (chronic kidney disease) stage 4, GFR 15-29 ml/min (HCC)    Followed by Nephrology - Dr. Wolfgang Phoenix On Jardiance Avoid nephrotoxic agents including NSAIDs  Other   Dyslipidemia    On Lipitor      No orders of the defined types were placed in this encounter.   Follow-up: Return in about 4 months (around 07/24/2023) for DM and HTN.    Anabel Halon, MD

## 2023-03-23 NOTE — Assessment & Plan Note (Signed)
On Lipitor 

## 2023-03-23 NOTE — Assessment & Plan Note (Signed)
Lab Results  Component Value Date   HGBA1C 5.4 03/23/2023   Well-controlled On Lantus 12 units nightly - had switched to Glipizide 5 mg QD as he has very tightly controlled glycemic profile currently, plan to DC Glipizide On Jardiance now for CKD and HFpEF Advised to follow diabetic diet On statin Diabetic eye exam: Advised to follow up with Ophthalmology for diabetic eye exam

## 2023-03-23 NOTE — Patient Instructions (Addendum)
Please continue to take medications as prescribed.  Please continue to follow low carb diet and ambulate as tolerated.  Please home medications for review in the next visit.

## 2023-03-23 NOTE — Assessment & Plan Note (Signed)
Followed by Nephrology - Dr. Bhutani On Jardiance Avoid nephrotoxic agents including NSAIDs 

## 2023-03-30 DIAGNOSIS — Z79899 Other long term (current) drug therapy: Secondary | ICD-10-CM | POA: Diagnosis not present

## 2023-03-30 DIAGNOSIS — I129 Hypertensive chronic kidney disease with stage 1 through stage 4 chronic kidney disease, or unspecified chronic kidney disease: Secondary | ICD-10-CM | POA: Diagnosis not present

## 2023-03-30 DIAGNOSIS — R809 Proteinuria, unspecified: Secondary | ICD-10-CM | POA: Diagnosis not present

## 2023-03-30 DIAGNOSIS — D638 Anemia in other chronic diseases classified elsewhere: Secondary | ICD-10-CM | POA: Diagnosis not present

## 2023-03-30 DIAGNOSIS — E211 Secondary hyperparathyroidism, not elsewhere classified: Secondary | ICD-10-CM | POA: Diagnosis not present

## 2023-03-30 DIAGNOSIS — E1122 Type 2 diabetes mellitus with diabetic chronic kidney disease: Secondary | ICD-10-CM | POA: Diagnosis not present

## 2023-04-06 DIAGNOSIS — I129 Hypertensive chronic kidney disease with stage 1 through stage 4 chronic kidney disease, or unspecified chronic kidney disease: Secondary | ICD-10-CM | POA: Diagnosis not present

## 2023-04-06 DIAGNOSIS — E1129 Type 2 diabetes mellitus with other diabetic kidney complication: Secondary | ICD-10-CM | POA: Diagnosis not present

## 2023-04-06 DIAGNOSIS — I5032 Chronic diastolic (congestive) heart failure: Secondary | ICD-10-CM | POA: Diagnosis not present

## 2023-04-06 DIAGNOSIS — E1122 Type 2 diabetes mellitus with diabetic chronic kidney disease: Secondary | ICD-10-CM | POA: Diagnosis not present

## 2023-05-30 ENCOUNTER — Other Ambulatory Visit: Payer: Self-pay | Admitting: Internal Medicine

## 2023-05-30 DIAGNOSIS — I10 Essential (primary) hypertension: Secondary | ICD-10-CM

## 2023-06-13 DIAGNOSIS — N189 Chronic kidney disease, unspecified: Secondary | ICD-10-CM | POA: Diagnosis not present

## 2023-06-13 DIAGNOSIS — R809 Proteinuria, unspecified: Secondary | ICD-10-CM | POA: Diagnosis not present

## 2023-06-13 DIAGNOSIS — D631 Anemia in chronic kidney disease: Secondary | ICD-10-CM | POA: Diagnosis not present

## 2023-06-13 DIAGNOSIS — I129 Hypertensive chronic kidney disease with stage 1 through stage 4 chronic kidney disease, or unspecified chronic kidney disease: Secondary | ICD-10-CM | POA: Diagnosis not present

## 2023-06-13 DIAGNOSIS — E211 Secondary hyperparathyroidism, not elsewhere classified: Secondary | ICD-10-CM | POA: Diagnosis not present

## 2023-06-13 DIAGNOSIS — E1122 Type 2 diabetes mellitus with diabetic chronic kidney disease: Secondary | ICD-10-CM | POA: Diagnosis not present

## 2023-06-13 DIAGNOSIS — E611 Iron deficiency: Secondary | ICD-10-CM | POA: Diagnosis not present

## 2023-06-13 DIAGNOSIS — I5032 Chronic diastolic (congestive) heart failure: Secondary | ICD-10-CM | POA: Diagnosis not present

## 2023-06-13 DIAGNOSIS — E1129 Type 2 diabetes mellitus with other diabetic kidney complication: Secondary | ICD-10-CM | POA: Diagnosis not present

## 2023-06-22 ENCOUNTER — Other Ambulatory Visit: Payer: Self-pay | Admitting: Internal Medicine

## 2023-06-22 DIAGNOSIS — Z794 Long term (current) use of insulin: Secondary | ICD-10-CM

## 2023-06-23 ENCOUNTER — Telehealth: Payer: Self-pay | Admitting: Internal Medicine

## 2023-06-23 NOTE — Telephone Encounter (Signed)
Spoke to pharmacist

## 2023-06-23 NOTE — Telephone Encounter (Signed)
[facility] calling needing clarification on test strips. Call back # [phone] Pharmacist Line. Please advise Thank You

## 2023-06-23 NOTE — Telephone Encounter (Signed)
Spoke with [facility] gave her the correct fax number so she can fax over Rx requests

## 2023-06-27 ENCOUNTER — Telehealth: Payer: Self-pay | Admitting: Internal Medicine

## 2023-06-27 ENCOUNTER — Other Ambulatory Visit: Payer: Self-pay

## 2023-06-27 DIAGNOSIS — E1121 Type 2 diabetes mellitus with diabetic nephropathy: Secondary | ICD-10-CM

## 2023-06-27 MED ORDER — TAMSULOSIN HCL 0.4 MG PO CAPS: 0.4000 mg | ORAL_CAPSULE | Freq: Every day | ORAL | 1 refills | Status: DC

## 2023-06-27 MED ORDER — BLOOD GLUCOSE MONITORING SUPPL DEVI: 1.0000 | Freq: Three times a day (TID) | 0 refills | Status: AC

## 2023-06-27 MED ORDER — GLIPIZIDE ER 5 MG PO TB24: 5.0000 mg | ORAL_TABLET | Freq: Every day | ORAL | 1 refills | Status: DC

## 2023-06-27 MED ORDER — BD PEN NEEDLE NANO 2ND GEN 32G X 4 MM MISC: 1.0000 | Freq: Every day | 5 refills | Status: AC

## 2023-06-27 NOTE — Telephone Encounter (Signed)
Dot Lanes called from [facility] pharmacy [phone] saying patient need med refill.  Prescription Request  06/27/2023  LOV: 03/23/2023  What is the name of the medication or equipment? tamsulosin (FLOMAX) 0.4 MG CAPS capsule   glipiZIDE (GLUCOTROL XL) 5 MG 24 hr tablet   BD PEN NEEDLE NANO 2ND GEN 32G X 4 MM MISC   Blood Glucose Monitoring Suppl DEVI   Have you contacted your pharmacy to request a refill? No   Which pharmacy would you like this sent to?  [facility] [phone] and fax # [phone]    Patient notified that their request is being sent to the clinical staff for review and that they should receive a response within 2 business days.   Please advise at pharmacy [facility]

## 2023-06-27 NOTE — Telephone Encounter (Signed)
Refills sent to pharmacy. 

## 2023-07-27 ENCOUNTER — Ambulatory Visit: Payer: HMO | Admitting: Internal Medicine

## 2023-07-27 ENCOUNTER — Ambulatory Visit
Admission: EM | Admit: 2023-07-27 | Discharge: 2023-07-27 | Disposition: A | Discharge status: Home / Self Care | Payer: PPO | Attending: Nurse Practitioner | Admitting: Nurse Practitioner

## 2023-07-27 DIAGNOSIS — S21201A Unspecified open wound of right back wall of thorax without penetration into thoracic cavity, initial encounter: Secondary | ICD-10-CM | POA: Diagnosis not present

## 2023-07-27 DIAGNOSIS — L720 Epidermal cyst: Secondary | ICD-10-CM

## 2023-07-27 MED ORDER — DOXYCYCLINE HYCLATE 100 MG PO TABS: 100.0000 mg | ORAL_TABLET | Freq: Two times a day (BID) | ORAL | 0 refills | Status: AC

## 2023-07-27 NOTE — Discharge Instructions (Signed)
Take medication as prescribed. Cleanse the area with an antibacterial soap such as Dial gold bar soap 1-2 times daily. Keep the area covered if it begins to drain. Stop use of peroxide. As discussed, it is recommended that you follow-up with your primary care physician to discuss referral to dermatology for further evaluation. Follow-up as needed.

## 2023-07-27 NOTE — ED Provider Notes (Signed)
[facility]    CSN: [id number] Arrival date & time: 07/27/23  1256      History   Chief Complaint No chief complaint on file.   HPI Darrell Marshall is a 88 y.o. male.   The history is provided by the patient.   Patient presents for complaints of "boils" to his back that been present for the past year.  He states approximately 2 weeks ago, one of the areas on his back "burst" and drained.  He states that the areas are not painful or tender to touch.  He states that the areas are "more aggravating", but they have not been causing any issues for him.  He states the area that opened, he has been cleaning with peroxide and covering it with a bandage.  He denies fever, chills, chest pain, abdominal pain, itching, or foul-smelling drainage.  Past Medical History:  Diagnosis Date   Atherosclerotic heart disease of native coronary artery with unspecified angina pectoris (HCC)    CAD (coronary artery disease), native coronary artery    cath 01/12/2016 50% ost D1, 60% prox LCx, 100% distal RCA lesion treated with DES   Carpal tunnel syndrome, bilateral upper limbs    Chronic systolic (congestive) heart failure (HCC)    CKD (chronic kidney disease), stage III (HCC) 01/15/2016   Diabetes mellitus without complication (HCC)    Essential (primary) hypertension    Hypertension    Hypertensive chronic kidney disease with stage 1 through stage 4 chronic kidney disease, or unspecified chronic kidney disease    Ischemic cardiomyopathy    Echo 01/13/2016 EF 40-45%   Kidney disease, chronic, stage III (moderate, EGFR 30-59 ml/min) (HCC)    Obesity, unspecified    Reflux esophagitis    Rheumatoid arthritis, unspecified (HCC)    ST elevation myocardial infarction (STEMI) of inferior wall (HCC) 01/12/2016   Subsequent ST elevation (STEMI) myocardial infarction of anterior wall (HCC)    Type 2 diabetes mellitus with diabetic nephropathy (HCC)    Type 2 diabetes mellitus with unspecified  complications [facility])     Patient Active Problem List   Diagnosis Date Noted   Encounter for general adult medical examination with abnormal findings 12/22/2022   Lichenified eczema 10/21/2021   Essential (primary) hypertension    Atherosclerotic heart disease of native coronary artery with unspecified angina pectoris (HCC)    Carpal tunnel syndrome, bilateral upper limbs    (HFpEF) heart failure with preserved ejection fraction (HCC)    Obesity, unspecified    Rheumatoid arthritis, unspecified (HCC)    Type 2 diabetes mellitus with diabetic nephropathy (HCC)    Dyslipidemia 01/22/2016   CKD (chronic kidney disease) stage 4, GFR 15-29 ml/min (HCC) 01/15/2016   CAD S/P percutaneous coronary angioplasty    Ischemic cardiomyopathy     Past Surgical History:  Procedure Laterality Date   APPENDECTOMY     CARDIAC CATHETERIZATION N/A 01/12/2016   Procedure: Left Heart Cath and Coronary Angiography;  Surgeon: Tonny Bollman, MD;  Location: [facility] INVASIVE CV LAB;  Service: Cardiovascular;  Laterality: N/A;   CARDIAC CATHETERIZATION N/A 01/12/2016   Procedure: Coronary Stent Intervention;  Surgeon: Tonny Bollman, MD;  Location: [facility] INVASIVE CV LAB;  Service: Cardiovascular;  Laterality: N/A;   CATARACT EXTRACTION W/PHACO Right 12/30/2019   Procedure: CATARACT EXTRACTION PHACO AND INTRAOCULAR LENS PLACEMENT (IOC);  Surgeon: Fabio Pierce, MD;  Location: [facility];  Service: Ophthalmology;  Laterality: Right;  CDE: 4.06   CATARACT EXTRACTION W/PHACO Left 01/13/2020   Procedure: CATARACT  EXTRACTION PHACO AND INTRAOCULAR LENS PLACEMENT (IOC);  Surgeon: Fabio Pierce, MD;  Location: [facility];  Service: Ophthalmology;  Laterality: Left;  CDE: 6.68   CORONARY STENT PLACEMENT  01/12/16       Home Medications    Prior to Admission medications   Medication Sig Start Date End Date Taking? Authorizing Provider  doxycycline (VIBRA-TABS) 100 MG tablet Take 1 tablet (100 mg total) by mouth 2 (two) times daily for  5 days. 07/27/23 08/01/23 Yes Haeley Fordham-Warren, Sadie Haber, NP  amLODipine (NORVASC) 5 MG tablet TAKE 1 TABLET (5 MG TOTAL) BY MOUTH DAILY. 05/31/23   Anabel Halon, MD  aspirin 81 MG chewable tablet Chew 1 tablet (81 mg total) by mouth daily. 01/15/16   Azalee Course, PA  atorvastatin (LIPITOR) 80 MG tablet Take 1 tablet (80 mg total) by mouth daily at 6 PM. 12/22/22   Anabel Halon, MD  Blood Glucose Monitoring Suppl DEVI 1 each by Does not apply route in the morning, at noon, and at bedtime. May substitute to any manufacturer covered by patient's insurance. 06/27/23   Anabel Halon, MD  empagliflozin (JARDIANCE) 10 MG TABS tablet 1 tablet Orally Once a day    [provider]  famotidine (PEPCID) 40 MG tablet Take 40 mg by mouth daily. 01/16/14   [provider]  glipiZIDE (GLUCOTROL XL) 5 MG 24 hr tablet Take 1 tablet (5 mg total) by mouth daily with breakfast. 06/27/23   Anabel Halon, MD  hydrALAZINE (APRESOLINE) 50 MG tablet TAKE 1 TABLET BY MOUTH THREE TIMES A DAY 01/18/23   Anabel Halon, MD  Insulin Pen Needle (BD PEN NEEDLE NANO 2ND GEN) 32G X 4 MM MISC 1 each by Does not apply route daily. 06/27/23   Anabel Halon, MD  leflunomide (ARAVA) 20 MG tablet Take 20 mg by mouth daily.    [provider]  metoprolol succinate (TOPROL-XL) 50 MG 24 hr tablet Take 1 tablet (50 mg total) by mouth daily. 12/22/22   Anabel Halon, MD  Multiple Vitamins-Minerals (MULTIVITAMIN ADULTS 50+ PO) Take 1 tablet by mouth daily. Men    [provider]  nitroGLYCERIN (NITROSTAT) 0.4 MG SL tablet Place 1 tablet (0.4 mg total) under the tongue every 5 (five) minutes x 3 doses as needed for chest pain. 01/15/16   Azalee Course, PA  tamsulosin (FLOMAX) 0.4 MG CAPS capsule Take 1 capsule (0.4 mg total) by mouth daily. 06/27/23   Anabel Halon, MD  triamcinolone ointment (KENALOG) 0.5 % Apply 1 Application topically 2 (two) times daily. As needed 12/22/22   Anabel Halon, MD  vitamin B-12  (CYANOCOBALAMIN) 500 MCG tablet Take 1,000 mcg by mouth daily.     [provider]    Family History Family History  Problem Relation Age of Onset   Ovarian cancer Mother    Diabetes Mother    Hypertension Mother    Prostate cancer Father    Heart attack Neg Hx    Stroke Neg Hx     Social History Social History   Tobacco Use   Smoking status: Never   Smokeless tobacco: Never  Vaping Use   Vaping status: Never Used  Substance Use Topics   Alcohol use: No   Drug use: No     Allergies   Losartan potassium   Review of Systems Review of Systems Per HPI  Physical Exam Triage Vital Signs ED Triage Vitals  Encounter Vitals Group  BP 07/27/23 1304 (!) 174/74     Systolic BP Percentile --      Diastolic BP Percentile --      Pulse Rate 07/27/23 1304 80     Resp 07/27/23 1304 18     Temp 07/27/23 1304 97.7 F (36.5 C)     Temp Source 07/27/23 1304 Oral     SpO2 07/27/23 1304 97 %     Weight --      Height --      Head Circumference --      Peak Flow --      Pain Score 07/27/23 1305 0     Pain Loc --      Pain Education --      Exclude from Growth Chart --    No data found.  Updated Vital Signs BP (!) 174/74 (BP Location: Right Arm)   Pulse 80   Temp 97.7 F (36.5 C) (Oral)   Resp 18   SpO2 97%   Visual Acuity Right Eye Distance:   Left Eye Distance:   Bilateral Distance:    Right Eye Near:   Left Eye Near:    Bilateral Near:     Physical Exam Vitals and nursing note reviewed.  Constitutional:      Appearance: Normal appearance.  HENT:     Head: Normocephalic.  Eyes:     Pupils: Pupils are equal, round, and reactive to light.  Pulmonary:     Effort: Pulmonary effort is normal.  Musculoskeletal:     Cervical back: Normal range of motion.  Skin:    General: Skin is warm and dry.          Comments: 3 cysts noted to the back.   Neurological:     General: No focal deficit present.     Mental Status: He is alert and oriented to  person, place, and time.  Psychiatric:        Mood and Affect: Mood normal.        Behavior: Behavior normal.      UC Treatments / Results  Labs (all labs ordered are listed, but only abnormal results are displayed) Labs Reviewed - No data to display  EKG   Radiology No results found.  Procedures Procedures (including critical care time)  Medications Ordered in UC Medications - No data to display  Initial Impression / Assessment and Plan / UC Course  I have reviewed the triage vital signs and the nursing notes.  Pertinent labs & imaging results that were available during my care of the patient were reviewed by me and considered in my medical decision making (see chart for details).  The patient is well-appearing, he is in no acute distress, vital signs are stable.  Symptoms appear to be consistent with epidermoid cysts.  Patient with 3 cyst present, he has had 1 that "burst" over the past 2 weeks.  Will cover for possible infection for the open wound with doxycycline 100 mg twice daily for the next 5 days.  Supportive care recommendations were provided and discussed with the patient to include keeping the open wound clean and dry, recommend using an antibacterial soap, keeping the area covered if it begins to drain, and stopping use of peroxide to the area.  Advised patient to follow-up with his primary care physician to discuss referral to dermatology for further evaluation of the cyst.  Patient is in agreement with this plan of care and verbalizes understanding.  All questions were answered.  Patient stable for discharge.  Final Clinical Impressions(s) / UC Diagnoses   Final diagnoses:  Epidermoid cyst of skin of back  Open wound of right side of back, initial encounter     Discharge Instructions      Take medication as prescribed. Cleanse the area with an antibacterial soap such as Dial gold bar soap 1-2 times daily. Keep the area covered if it begins to drain. Stop  use of peroxide. As discussed, it is recommended that you follow-up with your primary care physician to discuss referral to dermatology for further evaluation. Follow-up as needed.     ED Prescriptions     Medication Sig Dispense Auth. Provider   doxycycline (VIBRA-TABS) 100 MG tablet Take 1 tablet (100 mg total) by mouth 2 (two) times daily for 5 days. 10 tablet Errin Chewning-Warren, Sadie Haber, NP      PDMP not reviewed this encounter.   Abran Cantor, NP 07/27/23 [phone]

## 2023-07-27 NOTE — ED Triage Notes (Signed)
Pt reports he has 2 boils on his back x 3 weeks. States one had popped.

## 2023-08-01 ENCOUNTER — Ambulatory Visit: Payer: PPO | Attending: Cardiology | Admitting: Cardiology

## 2023-08-01 NOTE — Progress Notes (Deleted)
Clinical Summary Darrell Marshall is a 88 y.o.male  seen today for follow up of the following medical problems.    1. CAD - admit 12/2015 with inferior STEMI, received DES to RCA. Reperfusion complicated by VT, received defibrilation.  - echo 12/2015 LVEF 40-45%.  - echo Jan 2018 LVEF 50-55%, grade I diastolic dysfunction - echo 04/2020 LVEF 50-55%, grade I dd, normal RV       - no recent chest pain. No SOB/DOE - compliant with meds   2. HTN - he is compliant with meds   - unclear if swelling in the past was related to norvasc. Retrying, taking 5mg  daily without swelling.        3. Hyperlipidemia - - labs followed by pcp, family reporst recently checked - compliant with statin   4. CKD - followed by Dr Wolfgang Phoenix Past Medical History:  Diagnosis Date   Atherosclerotic heart disease of native coronary artery with unspecified angina pectoris (HCC)    CAD (coronary artery disease), native coronary artery    cath 01/12/2016 50% ost D1, 60% prox LCx, 100% distal RCA lesion treated with DES   Carpal tunnel syndrome, bilateral upper limbs    Chronic systolic (congestive) heart failure (HCC)    CKD (chronic kidney disease), stage III (HCC) 01/15/2016   Diabetes mellitus without complication (HCC)    Essential (primary) hypertension    Hypertension    Hypertensive chronic kidney disease with stage 1 through stage 4 chronic kidney disease, or unspecified chronic kidney disease    Ischemic cardiomyopathy    Echo 01/13/2016 EF 40-45%   Kidney disease, chronic, stage III (moderate, EGFR 30-59 ml/min) (HCC)    Obesity, unspecified    Reflux esophagitis    Rheumatoid arthritis, unspecified (HCC)    ST elevation myocardial infarction (STEMI) of inferior wall (HCC) 01/12/2016   Subsequent ST elevation (STEMI) myocardial infarction of anterior wall (HCC)    Type 2 diabetes mellitus with diabetic nephropathy (HCC)    Type 2 diabetes mellitus with unspecified complications (HCC)       Allergies  Allergen Reactions   Losartan Potassium Other (See Comments)     Current Outpatient Medications  Medication Sig Dispense Refill   amLODipine (NORVASC) 5 MG tablet TAKE 1 TABLET (5 MG TOTAL) BY MOUTH DAILY. 90 tablet 1   aspirin 81 MG chewable tablet Chew 1 tablet (81 mg total) by mouth daily.     atorvastatin (LIPITOR) 80 MG tablet Take 1 tablet (80 mg total) by mouth daily at 6 PM. 90 tablet 3   Blood Glucose Monitoring Suppl DEVI 1 each by Does not apply route in the morning, at noon, and at bedtime. May substitute to any manufacturer covered by patient's insurance. 1 each 0   doxycycline (VIBRA-TABS) 100 MG tablet Take 1 tablet (100 mg total) by mouth 2 (two) times daily for 5 days. 10 tablet 0   empagliflozin (JARDIANCE) 10 MG TABS tablet 1 tablet Orally Once a day     famotidine (PEPCID) 40 MG tablet Take 40 mg by mouth daily.     glipiZIDE (GLUCOTROL XL) 5 MG 24 hr tablet Take 1 tablet (5 mg total) by mouth daily with breakfast. 90 tablet 1   hydrALAZINE (APRESOLINE) 50 MG tablet TAKE 1 TABLET BY MOUTH THREE TIMES A DAY 270 tablet 2   Insulin Pen Needle (BD PEN NEEDLE NANO 2ND GEN) 32G X 4 MM MISC 1 each by Does not apply route daily. 100 each 5  leflunomide (ARAVA) 20 MG tablet Take 20 mg by mouth daily.     metoprolol succinate (TOPROL-XL) 50 MG 24 hr tablet Take 1 tablet (50 mg total) by mouth daily. 90 tablet 3   Multiple Vitamins-Minerals (MULTIVITAMIN ADULTS 50+ PO) Take 1 tablet by mouth daily. Men     nitroGLYCERIN (NITROSTAT) 0.4 MG SL tablet Place 1 tablet (0.4 mg total) under the tongue every 5 (five) minutes x 3 doses as needed for chest pain. 25 tablet 3   tamsulosin (FLOMAX) 0.4 MG CAPS capsule Take 1 capsule (0.4 mg total) by mouth daily. 90 capsule 1   triamcinolone ointment (KENALOG) 0.5 % Apply 1 Application topically 2 (two) times daily. As needed 30 g 2   vitamin B-12 (CYANOCOBALAMIN) 500 MCG tablet Take 1,000 mcg by mouth daily.      No current  facility-administered medications for this visit.     Past Surgical History:  Procedure Laterality Date   APPENDECTOMY     CARDIAC CATHETERIZATION N/A 01/12/2016   Procedure: Left Heart Cath and Coronary Angiography;  Surgeon: Tonny Bollman, MD;  Location: [facility] INVASIVE CV LAB;  Service: Cardiovascular;  Laterality: N/A;   CARDIAC CATHETERIZATION N/A 01/12/2016   Procedure: Coronary Stent Intervention;  Surgeon: Tonny Bollman, MD;  Location: [facility] INVASIVE CV LAB;  Service: Cardiovascular;  Laterality: N/A;   CATARACT EXTRACTION W/PHACO Right 12/30/2019   Procedure: CATARACT EXTRACTION PHACO AND INTRAOCULAR LENS PLACEMENT (IOC);  Surgeon: Fabio Pierce, MD;  Location: [facility];  Service: Ophthalmology;  Laterality: Right;  CDE: 4.06   CATARACT EXTRACTION W/PHACO Left 01/13/2020   Procedure: CATARACT EXTRACTION PHACO AND INTRAOCULAR LENS PLACEMENT (IOC);  Surgeon: Fabio Pierce, MD;  Location: [facility];  Service: Ophthalmology;  Laterality: Left;  CDE: 6.68   CORONARY STENT PLACEMENT  01/12/16     Allergies  Allergen Reactions   Losartan Potassium Other (See Comments)      Family History  Problem Relation Age of Onset   Ovarian cancer Mother    Diabetes Mother    Hypertension Mother    Prostate cancer Father    Heart attack Neg Hx    Stroke Neg Hx      Social History Darrell Marshall reports that he has never smoked. He has never used smokeless tobacco. Darrell Marshall reports no history of alcohol use.   Review of Systems CONSTITUTIONAL: No weight loss, fever, chills, weakness or fatigue.  HEENT: Eyes: No visual loss, blurred vision, double vision or yellow sclerae.No hearing loss, sneezing, congestion, runny nose or sore throat.  SKIN: No rash or itching.  CARDIOVASCULAR:  RESPIRATORY: No shortness of breath, cough or sputum.  GASTROINTESTINAL: No anorexia, nausea, vomiting or diarrhea. No abdominal pain or blood.  GENITOURINARY: No burning on urination, no polyuria NEUROLOGICAL: No  headache, dizziness, syncope, paralysis, ataxia, numbness or tingling in the extremities. No change in bowel or bladder control.  MUSCULOSKELETAL: No muscle, back pain, joint pain or stiffness.  LYMPHATICS: No enlarged nodes. No history of splenectomy.  PSYCHIATRIC: No history of depression or anxiety.  ENDOCRINOLOGIC: No reports of sweating, cold or heat intolerance. No polyuria or polydipsia.  [name] Kitchen   Physical Examination There were no vitals filed for this visit. There were no vitals filed for this visit.  Gen: resting comfortably, no acute distress HEENT: no scleral icterus, pupils equal round and reactive, no palptable cervical adenopathy,  CV Resp: Clear to auscultation bilaterally GI: abdomen is soft, non-tender, non-distended, normal bowel sounds, no hepatosplenomegaly MSK: extremities are warm, no edema.  Skin: warm, no rash Neuro:  no focal deficits Psych: appropriate affect   Diagnostic Studies  Jan 2018 echo Study Conclusions   - Left ventricle: The cavity size was normal. Wall thickness was   increased in a pattern of moderate LVH. Systolic function was   normal. The estimated ejection fraction was in the range of 50%   to 55%. Doppler parameters are consistent with abnormal left   ventricular relaxation (grade 1 diastolic dysfunction). - Aortic valve: Mildly calcified annulus. Trileaflet; mildly   thickened leaflets. Valve area (VTI): 2.26 cm^2. Valve area   (Vmax): 2.1 cm^2. Valve area (Vmean): 2.01 cm^2. - Technically adequate study.     Assessment and Plan  1. CAD -no symptoms, continue current meds   2. HTN - elevated today. Had been at goal at nephrology appt last month,they report was also doing well at recent pcp visit - he will call Friday with udpated home bp's, if remains elevated would increase his hydralazine.    3. Hyperlipidemia - request pcp labs, continue statin      Antoine Poche, M.D., F.A.C.C.

## 2023-08-07 DIAGNOSIS — N189 Chronic kidney disease, unspecified: Secondary | ICD-10-CM | POA: Diagnosis not present

## 2023-08-07 DIAGNOSIS — E1122 Type 2 diabetes mellitus with diabetic chronic kidney disease: Secondary | ICD-10-CM | POA: Diagnosis not present

## 2023-08-07 DIAGNOSIS — R809 Proteinuria, unspecified: Secondary | ICD-10-CM | POA: Diagnosis not present

## 2023-08-07 DIAGNOSIS — E211 Secondary hyperparathyroidism, not elsewhere classified: Secondary | ICD-10-CM | POA: Diagnosis not present

## 2023-08-07 DIAGNOSIS — D631 Anemia in chronic kidney disease: Secondary | ICD-10-CM | POA: Diagnosis not present

## 2023-08-07 DIAGNOSIS — I129 Hypertensive chronic kidney disease with stage 1 through stage 4 chronic kidney disease, or unspecified chronic kidney disease: Secondary | ICD-10-CM | POA: Diagnosis not present

## 2023-08-08 ENCOUNTER — Telehealth: Payer: Self-pay | Admitting: Internal Medicine

## 2023-08-08 DIAGNOSIS — E1129 Type 2 diabetes mellitus with other diabetic kidney complication: Secondary | ICD-10-CM | POA: Diagnosis not present

## 2023-08-08 DIAGNOSIS — I129 Hypertensive chronic kidney disease with stage 1 through stage 4 chronic kidney disease, or unspecified chronic kidney disease: Secondary | ICD-10-CM | POA: Diagnosis not present

## 2023-08-08 DIAGNOSIS — E1122 Type 2 diabetes mellitus with diabetic chronic kidney disease: Secondary | ICD-10-CM | POA: Diagnosis not present

## 2023-08-08 DIAGNOSIS — E211 Secondary hyperparathyroidism, not elsewhere classified: Secondary | ICD-10-CM | POA: Diagnosis not present

## 2023-08-08 NOTE — Telephone Encounter (Signed)
Called in regard to boils. Patient states that they are not getting any better wants to have referral placed for dermatology .  Wants a call back in regard Can call back on [phone] or [phone]

## 2023-08-09 NOTE — Telephone Encounter (Signed)
scheduled

## 2023-08-09 NOTE — Telephone Encounter (Signed)
Called patient, patient hung up the phone , called the patient back on  home # no answer just rings.  Called patient on mobile # no answer.

## 2023-08-09 NOTE — Telephone Encounter (Signed)
Spoke to niece [phone] said per patient he has already discuss this with Dr Allena Katz needs a referral to dermatologist. Patient has recently lost his daughter, niece is handling his appointments. He also doing a lot of sleeping concern cyst on back causing him to sleep being diabetic since his daughter passed. Does patient need a follow up. . Does patient need a follow up with Dr Allena Katz before referral to dermatologist.

## 2023-08-09 NOTE — Telephone Encounter (Signed)
FYI, Patient niece scheduled an in office visit with Dr Allena Katz 09.25.2024

## 2023-08-16 ENCOUNTER — Ambulatory Visit (INDEPENDENT_AMBULATORY_CARE_PROVIDER_SITE_OTHER): Payer: PPO | Admitting: Internal Medicine

## 2023-08-16 ENCOUNTER — Encounter: Payer: Self-pay | Admitting: Internal Medicine

## 2023-08-16 VITALS — BP 148/84 | HR 82 | Ht 68.5 in | Wt 210.6 lb

## 2023-08-16 DIAGNOSIS — Z23 Encounter for immunization: Secondary | ICD-10-CM

## 2023-08-16 DIAGNOSIS — F5101 Primary insomnia: Secondary | ICD-10-CM | POA: Diagnosis not present

## 2023-08-16 DIAGNOSIS — N184 Chronic kidney disease, stage 4 (severe): Secondary | ICD-10-CM | POA: Diagnosis not present

## 2023-08-16 DIAGNOSIS — E1121 Type 2 diabetes mellitus with diabetic nephropathy: Secondary | ICD-10-CM | POA: Diagnosis not present

## 2023-08-16 DIAGNOSIS — I10 Essential (primary) hypertension: Secondary | ICD-10-CM | POA: Diagnosis not present

## 2023-08-16 DIAGNOSIS — Z794 Long term (current) use of insulin: Secondary | ICD-10-CM | POA: Diagnosis not present

## 2023-08-16 DIAGNOSIS — L720 Epidermal cyst: Secondary | ICD-10-CM | POA: Insufficient documentation

## 2023-08-16 MED ORDER — MUPIROCIN 2 % EX OINT
1.0000 | TOPICAL_OINTMENT | Freq: Two times a day (BID) | CUTANEOUS | 0 refills | Status: AC
Start: 2023-08-16 — End: ?

## 2023-08-16 MED ORDER — TRAZODONE HCL 50 MG PO TABS
25.0000 mg | ORAL_TABLET | Freq: Every evening | ORAL | 3 refills | Status: DC | PRN
Start: 2023-08-16 — End: 2023-09-07

## 2023-08-16 NOTE — Assessment & Plan Note (Signed)
Followed by Nephrology - Dr. Wolfgang Phoenix On Jardiance Avoid nephrotoxic agents including NSAIDs

## 2023-08-16 NOTE — Patient Instructions (Addendum)
Please apply Mupirocin ointment over the bursted cyst.  You are being referred to Dermatology. [address], [place], [place] [address] Phone: [phone]  Please take Trazodone as needed for insomnia.  Please maintain simple sleep hygiene. - Maintain dark and non-noisy environment in the bedroom. - Please use the bedroom for sleep and sexual activity only. - Do not use electronic devices in the bedroom. - Please take dinner at least 2 hours before bedtime. - Please avoid caffeinated products in the evening, including coffee, soft drinks. - Please try to maintain the regular sleep-wake cycle - Go to bed and wake up at the same time.

## 2023-08-16 NOTE — Assessment & Plan Note (Addendum)
Current cysts are not inflamed or infected Referred to dermatology for further management Advised to avoid scratching the cysts Recently completed doxycycline for inflamed, ruptured cyst - Mupirocin ointment for local care

## 2023-08-16 NOTE — Assessment & Plan Note (Signed)
Sleep hygiene discussed, material provided Started trazodone as needed for insomnia

## 2023-08-16 NOTE — Assessment & Plan Note (Signed)
BP Readings from Last 1 Encounters:  08/16/23 (!) 148/84   Elevated today as he has not had Hydralazine afternoon dose Overall remains well-controlled with Amlodipine, Metoprolol and Hydralazine Counseled for compliance with the medications Advised DASH diet and moderate exercise/walking as tolerated

## 2023-08-16 NOTE — Assessment & Plan Note (Signed)
Lab Results  Component Value Date   HGBA1C 5.4 03/23/2023   Well-controlled On Glipizide 5 mg QD, was on Lantus, DCed as he has very tightly controlled glycemic profile currently, plan to DC Glipizide as well On Jardiance now for CKD and HFpEF Advised to follow diabetic diet On statin Diabetic eye exam: Advised to follow up with Ophthalmology for diabetic eye exam

## 2023-08-16 NOTE — Progress Notes (Signed)
Established Patient Office Visit  Subjective:  Patient ID: Darrell Marshall, male    DOB: [birth year]  Age: 88 y.o. MRN: [id number]  CC:  Chief Complaint  Patient presents with   Referral    Dermatology referral for cyst on his back    Insomnia    Patient is having difficulty sleeping     HPI Darrell Marshall is a 88 y.o. male with past medical history of CAD s/p stent placement, HTN, type II DM with CKD, RA and HLD who presents for f/u of recent urgent care visit and for insomnia.  He went to ER on 09/05 for a bump on his back, which had ruptured spontaneously.  He was found to have inflamed epidermoid cyst, which had spontaneously drained.  He was given Doxycycline, which he has completed now.  He denies any fever or chills currently.  He still has at least 4 other cyst on the back, which are asymptomatic currently.  Denies any recent change in size or shape recently.  He also reports difficulty initiating sleep.  He recently lost his daughter about a month ago.  Of note, he reports that he has had chronic insomnia.  Denies anhedonia, SI or HI currently.  CAD and HTN: BP is well-controlled. Takes medications regularly. Patient denies headache, dizziness, chest pain, dyspnea or palpitations.  He follows up with cardiology for history of CAD.  He takes aspirin and statin.  He also takes metoprolol.   Type II DM: He takes Glipizide 5 mg QD now. He used to take Lantus 16 units at nighttime. He is also placed on Jardiance for CKD. His blood glucose remains controlled.  His last HbA1c was 5.4. His blood glucose has been around 110s in the morning.  Denies any fatigue, polyuria, polyphagia or recent change in weight.  He also takes statin.   CKD: Follows up with nephrology.  Denies any dysuria or hematuria. Denies any LE swelling currently.     Past Medical History:  Diagnosis Date   Atherosclerotic heart disease of native coronary artery with unspecified angina pectoris (HCC)    CAD  (coronary artery disease), native coronary artery    cath 01/12/2016 50% ost D1, 60% prox LCx, 100% distal RCA lesion treated with DES   Carpal tunnel syndrome, bilateral upper limbs    Chronic systolic (congestive) heart failure (HCC)    CKD (chronic kidney disease), stage III (HCC) 01/15/2016   Diabetes mellitus without complication (HCC)    Essential (primary) hypertension    Hypertension    Hypertensive chronic kidney disease with stage 1 through stage 4 chronic kidney disease, or unspecified chronic kidney disease    Ischemic cardiomyopathy    Echo 01/13/2016 EF 40-45%   Kidney disease, chronic, stage III (moderate, EGFR 30-59 ml/min) (HCC)    Obesity, unspecified    Reflux esophagitis    Rheumatoid arthritis, unspecified (HCC)    ST elevation myocardial infarction (STEMI) of inferior wall (HCC) 01/12/2016   Subsequent ST elevation (STEMI) myocardial infarction of anterior wall (HCC)    Type 2 diabetes mellitus with diabetic nephropathy (HCC)    Type 2 diabetes mellitus with unspecified complications [facility])     Past Surgical History:  Procedure Laterality Date   APPENDECTOMY     CARDIAC CATHETERIZATION N/A 01/12/2016   Procedure: Left Heart Cath and Coronary Angiography;  Surgeon: Tonny Bollman, MD;  Location: [facility] INVASIVE CV LAB;  Service: Cardiovascular;  Laterality: N/A;   CARDIAC CATHETERIZATION N/A 01/12/2016   Procedure: Coronary  Stent Intervention;  Surgeon: Tonny Bollman, MD;  Location: [facility] INVASIVE CV LAB;  Service: Cardiovascular;  Laterality: N/A;   CATARACT EXTRACTION W/PHACO Right 12/30/2019   Procedure: CATARACT EXTRACTION PHACO AND INTRAOCULAR LENS PLACEMENT (IOC);  Surgeon: Fabio Pierce, MD;  Location: [facility] ORS;  Service: Ophthalmology;  Laterality: Right;  CDE: 4.06   CATARACT EXTRACTION W/PHACO Left 01/13/2020   Procedure: CATARACT EXTRACTION PHACO AND INTRAOCULAR LENS PLACEMENT (IOC);  Surgeon: Fabio Pierce, MD;  Location: [facility];  Service: Ophthalmology;  Laterality:  Left;  CDE: 6.68   CORONARY STENT PLACEMENT  01/12/16    Family History  Problem Relation Age of Onset   Ovarian cancer Mother    Diabetes Mother    Hypertension Mother    Prostate cancer Father    Heart attack Neg Hx    Stroke Neg Hx     Social History   Socioeconomic History   Marital status: Widowed    Spouse name: Not on file   Number of children: Not on file   Years of education: Not on file   Highest education level: Not on file  Occupational History   Not on file  Tobacco Use   Smoking status: Never   Smokeless tobacco: Never  Vaping Use   Vaping status: Never Used  Substance and Sexual Activity   Alcohol use: No   Drug use: No   Sexual activity: Not on file  Other Topics Concern   Not on file  Social History Narrative   Not on file   Social Determinants of Health   Financial Resource Strain: Low Risk  (03/15/2023)   Overall Financial Resource Strain (CARDIA)    Difficulty of Paying Living Expenses: Not hard at all  Food Insecurity: No Food Insecurity (03/15/2023)   Hunger Vital Sign    Worried About Running Out of Food in the Last Year: Never true    Ran Out of Food in the Last Year: Never true  Transportation Needs: No Transportation Needs (03/15/2023)   PRAPARE - Administrator, Civil Service (Medical): No    Lack of Transportation (Non-Medical): No  Physical Activity: Sufficiently Active (03/15/2023)   Exercise Vital Sign    Days of Exercise per Week: 7 days    Minutes of Exercise per Session: 60 min  Stress: No Stress Concern Present (03/15/2023)   [employer] of Occupational Health - Occupational Stress Questionnaire    Feeling of Stress : Not at all  Social Connections: Moderately Isolated (03/15/2023)   Social Connection and Isolation Panel [NHANES]    Frequency of Communication with Friends and Family: Twice a week    Frequency of Social Gatherings with Friends and Family: Twice a week    Attends Religious Services: More than 4  times per year    Active Member of [employer] or Organizations: No    Attends Banker Meetings: Never    Marital Status: Widowed  Intimate Partner Violence: Not At Risk (03/15/2023)   Humiliation, Afraid, Rape, and Kick questionnaire    Fear of Current or Ex-Partner: No    Emotionally Abused: No    Physically Abused: No    Sexually Abused: No    Outpatient Medications Prior to Visit  Medication Sig Dispense Refill   amLODipine (NORVASC) 5 MG tablet TAKE 1 TABLET (5 MG TOTAL) BY MOUTH DAILY. 90 tablet 1   aspirin 81 MG chewable tablet Chew 1 tablet (81 mg total) by mouth daily.     atorvastatin (LIPITOR) 80 MG  tablet Take 1 tablet (80 mg total) by mouth daily at 6 PM. 90 tablet 3   Blood Glucose Monitoring Suppl DEVI 1 each by Does not apply route in the morning, at noon, and at bedtime. May substitute to any manufacturer covered by patient's insurance. 1 each 0   empagliflozin (JARDIANCE) 10 MG TABS tablet 1 tablet Orally Once a day     famotidine (PEPCID) 40 MG tablet Take 40 mg by mouth daily.     glipiZIDE (GLUCOTROL XL) 5 MG 24 hr tablet Take 1 tablet (5 mg total) by mouth daily with breakfast. 90 tablet 1   hydrALAZINE (APRESOLINE) 50 MG tablet TAKE 1 TABLET BY MOUTH THREE TIMES A DAY 270 tablet 2   Insulin Pen Needle (BD PEN NEEDLE NANO 2ND GEN) 32G X 4 MM MISC 1 each by Does not apply route daily. 100 each 5   leflunomide (ARAVA) 20 MG tablet Take 20 mg by mouth daily.     metoprolol succinate (TOPROL-XL) 50 MG 24 hr tablet Take 1 tablet (50 mg total) by mouth daily. 90 tablet 3   Multiple Vitamins-Minerals (MULTIVITAMIN ADULTS 50+ PO) Take 1 tablet by mouth daily. Men     nitroGLYCERIN (NITROSTAT) 0.4 MG SL tablet Place 1 tablet (0.4 mg total) under the tongue every 5 (five) minutes x 3 doses as needed for chest pain. 25 tablet 3   tamsulosin (FLOMAX) 0.4 MG CAPS capsule Take 1 capsule (0.4 mg total) by mouth daily. 90 capsule 1   triamcinolone ointment (KENALOG) 0.5 %  Apply 1 Application topically 2 (two) times daily. As needed 30 g 2   vitamin B-12 (CYANOCOBALAMIN) 500 MCG tablet Take 1,000 mcg by mouth daily.      No facility-administered medications prior to visit.    Allergies  Allergen Reactions   Losartan Potassium Other (See Comments)    ROS Review of Systems  Constitutional:  Negative for chills and fever.  HENT:  Negative for congestion and sore throat.   Eyes:  Negative for pain and discharge.  Respiratory:  Negative for cough and shortness of breath.   Cardiovascular:  Negative for chest pain and palpitations.  Gastrointestinal:  Negative for constipation, diarrhea, nausea and vomiting.  Endocrine: Negative for polydipsia and polyuria.  Genitourinary:  Negative for dysuria and hematuria.  Musculoskeletal:  Positive for arthralgias. Negative for neck pain and neck stiffness.  Skin:        Bumps on back  Neurological:  Negative for dizziness, weakness, numbness and headaches.  Psychiatric/Behavioral:  Negative for agitation and behavioral problems.       Objective:    Physical Exam Vitals reviewed.  Constitutional:      General: He is not in acute distress.    Appearance: He is not diaphoretic.  HENT:     Head: Normocephalic and atraumatic.     Nose: Nose normal.     Mouth/Throat:     Mouth: Mucous membranes are moist.  Eyes:     General: No scleral icterus.    Extraocular Movements: Extraocular movements intact.  Cardiovascular:     Rate and Rhythm: Normal rate and regular rhythm.     Pulses: Normal pulses.     Heart sounds: Normal heart sounds. No murmur heard. Pulmonary:     Breath sounds: Normal breath sounds. No wheezing or rales.  Musculoskeletal:     Cervical back: Neck supple. No tenderness.     Right lower leg: No edema.     Left lower leg: No  edema.  Skin:    General: Skin is warm.     Findings: Rash (Lichenified eczema over left leg near ankle) present.     Comments: Has cysts  over back - 2 cysts over  left scapula area, 1 cyst over thoracic spine area, 1 ruptured cyst over right thoracic area, 1 cyst over lumar lumbar area - see image in media section, nontender cysts  Neurological:     General: No focal deficit present.     Mental Status: He is alert and oriented to person, place, and time.     Sensory: No sensory deficit.     Motor: No weakness.  Psychiatric:        Mood and Affect: Mood normal.        Behavior: Behavior normal.     BP (!) 148/84 (BP Location: Right Arm)   Pulse 82   Ht 5' 8.5" (1.74 m)   Wt 210 lb 9.6 oz (95.5 kg)   SpO2 97%   BMI 31.56 kg/m  Wt Readings from Last 3 Encounters:  08/16/23 210 lb 9.6 oz (95.5 kg)  03/23/23 220 lb 9.6 oz (100.1 kg)  03/15/23 219 lb 1.9 oz (99.4 kg)    Lab Results  Component Value Date   TSH 3.390 12/22/2022   Lab Results  Component Value Date   WBC 6.8 06/27/2019   HGB 12.5 (L) 06/27/2019   HCT 42.4 06/27/2019   MCV 71.7 (L) 06/27/2019   PLT 220 06/27/2019   Lab Results  Component Value Date   NA 141 12/22/2022   K 4.2 12/22/2022   CO2 19 (L) 12/22/2022   GLUCOSE 131 (H) 12/22/2022   BUN 26 12/22/2022   CREATININE 2.70 (H) 12/22/2022   BILITOT 0.3 12/22/2022   ALKPHOS 100 12/22/2022   AST 24 12/22/2022   ALT 16 12/22/2022   PROT 6.4 12/22/2022   ALBUMIN 4.0 12/22/2022   CALCIUM 9.4 12/22/2022   ANIONGAP 11 06/27/2019   EGFR 22 (L) 12/22/2022   Lab Results  Component Value Date   CHOL 88 (L) 12/22/2022   Lab Results  Component Value Date   HDL 33 (L) 12/22/2022   Lab Results  Component Value Date   LDLCALC 34 12/22/2022   Lab Results  Component Value Date   TRIG 115 12/22/2022   Lab Results  Component Value Date   CHOLHDL 2.7 12/22/2022   Lab Results  Component Value Date   HGBA1C 5.4 03/23/2023      Assessment & Plan:   Problem List Items Addressed This Visit       Cardiovascular and Mediastinum   Essential (primary) hypertension    BP Readings from Last 1 Encounters:   08/16/23 (!) 148/84   Elevated today as he has not had Hydralazine afternoon dose Overall remains well-controlled with Amlodipine, Metoprolol and Hydralazine Counseled for compliance with the medications Advised DASH diet and moderate exercise/walking as tolerated        Endocrine   Type 2 diabetes mellitus with diabetic nephropathy (HCC)    Lab Results  Component Value Date   HGBA1C 5.4 03/23/2023   Well-controlled On Glipizide 5 mg QD, was on Lantus, DCed as he has very tightly controlled glycemic profile currently, plan to DC Glipizide as well On Jardiance now for CKD and HFpEF Advised to follow diabetic diet On statin Diabetic eye exam: Advised to follow up with Ophthalmology for diabetic eye exam        Musculoskeletal and Integument   Epidermoid cyst  of skin of back - Primary    Current cysts are not inflamed or infected Referred to dermatology for further management Advised to avoid scratching the cysts Recently completed doxycycline for inflamed, ruptured cyst - Mupirocin ointment for local care      Relevant Medications   mupirocin ointment (BACTROBAN) 2 %   Other Relevant Orders   Ambulatory referral to Dermatology     Genitourinary   CKD (chronic kidney disease) stage 4, GFR 15-29 ml/min (HCC)    Followed by Nephrology - Dr. Wolfgang Phoenix On Jardiance Avoid nephrotoxic agents including NSAIDs        Other   Primary insomnia    Sleep hygiene discussed, material provided Started trazodone as needed for insomnia      Relevant Medications   traZODone (DESYREL) 50 MG tablet   Other Visit Diagnoses     Encounter for immunization       Relevant Orders   Flu Vaccine Trivalent High Dose (Fluad) (Completed)       Meds ordered this encounter  Medications   traZODone (DESYREL) 50 MG tablet    Sig: Take 0.5-1 tablets (25-50 mg total) by mouth at bedtime as needed for sleep.    Dispense:  30 tablet    Refill:  3   mupirocin ointment (BACTROBAN) 2 %     Sig: Apply 1 Application topically 2 (two) times daily.    Dispense:  22 g    Refill:  0    Follow-up: Return if symptoms worsen or fail to improve.    Anabel Halon, MD

## 2023-09-06 DIAGNOSIS — R21 Rash and other nonspecific skin eruption: Secondary | ICD-10-CM | POA: Diagnosis not present

## 2023-09-06 DIAGNOSIS — E669 Obesity, unspecified: Secondary | ICD-10-CM | POA: Diagnosis not present

## 2023-09-06 DIAGNOSIS — Z6832 Body mass index (BMI) 32.0-32.9, adult: Secondary | ICD-10-CM | POA: Diagnosis not present

## 2023-09-06 DIAGNOSIS — D649 Anemia, unspecified: Secondary | ICD-10-CM | POA: Diagnosis not present

## 2023-09-06 DIAGNOSIS — N1831 Chronic kidney disease, stage 3a: Secondary | ICD-10-CM | POA: Diagnosis not present

## 2023-09-06 DIAGNOSIS — M0609 Rheumatoid arthritis without rheumatoid factor, multiple sites: Secondary | ICD-10-CM | POA: Diagnosis not present

## 2023-09-06 DIAGNOSIS — Z79899 Other long term (current) drug therapy: Secondary | ICD-10-CM | POA: Diagnosis not present

## 2023-09-07 ENCOUNTER — Other Ambulatory Visit: Payer: Self-pay | Admitting: Internal Medicine

## 2023-09-07 DIAGNOSIS — F5101 Primary insomnia: Secondary | ICD-10-CM

## 2023-09-11 DIAGNOSIS — L720 Epidermal cyst: Secondary | ICD-10-CM | POA: Diagnosis not present

## 2023-10-05 DIAGNOSIS — L720 Epidermal cyst: Secondary | ICD-10-CM | POA: Diagnosis not present

## 2023-10-17 DIAGNOSIS — N189 Chronic kidney disease, unspecified: Secondary | ICD-10-CM | POA: Diagnosis not present

## 2023-10-17 DIAGNOSIS — E1122 Type 2 diabetes mellitus with diabetic chronic kidney disease: Secondary | ICD-10-CM | POA: Diagnosis not present

## 2023-10-17 DIAGNOSIS — I129 Hypertensive chronic kidney disease with stage 1 through stage 4 chronic kidney disease, or unspecified chronic kidney disease: Secondary | ICD-10-CM | POA: Diagnosis not present

## 2023-10-17 DIAGNOSIS — I5032 Chronic diastolic (congestive) heart failure: Secondary | ICD-10-CM | POA: Diagnosis not present

## 2023-10-17 DIAGNOSIS — D631 Anemia in chronic kidney disease: Secondary | ICD-10-CM | POA: Diagnosis not present

## 2023-10-17 DIAGNOSIS — E211 Secondary hyperparathyroidism, not elsewhere classified: Secondary | ICD-10-CM | POA: Diagnosis not present

## 2023-10-17 DIAGNOSIS — R809 Proteinuria, unspecified: Secondary | ICD-10-CM | POA: Diagnosis not present

## 2023-10-17 DIAGNOSIS — E1129 Type 2 diabetes mellitus with other diabetic kidney complication: Secondary | ICD-10-CM | POA: Diagnosis not present

## 2023-10-23 DIAGNOSIS — L720 Epidermal cyst: Secondary | ICD-10-CM | POA: Diagnosis not present

## 2023-11-21 ENCOUNTER — Encounter: Payer: Self-pay | Admitting: Internal Medicine

## 2023-11-21 ENCOUNTER — Ambulatory Visit (INDEPENDENT_AMBULATORY_CARE_PROVIDER_SITE_OTHER): Payer: PPO | Admitting: Internal Medicine

## 2023-11-21 VITALS — BP 146/72 | HR 81 | Ht 68.5 in | Wt 220.6 lb

## 2023-11-21 DIAGNOSIS — N184 Chronic kidney disease, stage 4 (severe): Secondary | ICD-10-CM

## 2023-11-21 DIAGNOSIS — E1121 Type 2 diabetes mellitus with diabetic nephropathy: Secondary | ICD-10-CM

## 2023-11-21 DIAGNOSIS — I10 Essential (primary) hypertension: Secondary | ICD-10-CM

## 2023-11-21 DIAGNOSIS — Z794 Long term (current) use of insulin: Secondary | ICD-10-CM

## 2023-11-21 DIAGNOSIS — F5101 Primary insomnia: Secondary | ICD-10-CM

## 2023-11-21 DIAGNOSIS — E782 Mixed hyperlipidemia: Secondary | ICD-10-CM | POA: Insufficient documentation

## 2023-11-21 MED ORDER — METOPROLOL SUCCINATE ER 50 MG PO TB24: 50.0000 mg | ORAL_TABLET | Freq: Every day | ORAL | 3 refills | Status: DC

## 2023-11-21 MED ORDER — AMLODIPINE BESYLATE 5 MG PO TABS
5.0000 mg | ORAL_TABLET | Freq: Every day | ORAL | 3 refills | Status: DC
Start: 2023-11-21 — End: 2024-09-26

## 2023-11-21 MED ORDER — HYDRALAZINE HCL 50 MG PO TABS: 50.0000 mg | ORAL_TABLET | Freq: Three times a day (TID) | ORAL | 2 refills | Status: DC

## 2023-11-21 MED ORDER — ATORVASTATIN CALCIUM 80 MG PO TABS: 80.0000 mg | ORAL_TABLET | Freq: Every day | ORAL | 3 refills | Status: DC

## 2023-11-21 NOTE — Assessment & Plan Note (Signed)
 Sleep hygiene discussed, material provided On trazodone 25 mg as needed for insomnia - advised to take 50 mg as needed as he still has insomnia

## 2023-11-21 NOTE — Assessment & Plan Note (Addendum)
 BP Readings from Last 1 Encounters:  11/21/23 (!) 146/72   Elevated today as he has run out of amlodipine  and metoprolol  Overall remains well-controlled with Amlodipine  5 mg QD, Metoprolol  50 mg QD and Hydralazine  50 mg 3 times daily Counseled for compliance with the medications Advised DASH diet and moderate exercise/walking as tolerated

## 2023-11-21 NOTE — Assessment & Plan Note (Addendum)
On Lipitor 80 mg once daily - needs to be compliant Checked lipid profile

## 2023-11-21 NOTE — Progress Notes (Signed)
 Established Patient Office Visit  Subjective:  Patient ID: Darrell Marshall, male    DOB: Dec 01, 1934  Age: 88 y.o. MRN: [id number]  CC:  Chief Complaint  Patient presents with   Diabetes    Three month follow up     HPI Darrell Marshall is a 88 y.o. male with past medical history of CAD s/p stent placement, HTN, type II DM with CKD, RA and HLD who presents for f/u of his chronic medical conditions.  CAD and HTN: BP is elevated today, but he has apparently run out of amlodipine  and metoprolol  while switching pharmacy. Takes hydralazine  50 mg 3 times daily regularly. Patient denies headache, dizziness, chest pain, dyspnea or palpitations.  He follows up with cardiology for history of CAD.  He takes aspirin  and statin.   Type II DM: He takes Glipizide  5 mg QD now. He used to take Lantus 16 units at nighttime. He is also placed on Jardiance for CKD. His blood glucose remains controlled.  His HbA1c was 5.4 today. His blood glucose has been around 110s in the morning.  Denies any episode of hypoglycemia.  Denies any fatigue, polyuria, polyphagia or recent change in weight.  He also takes statin.  CKD: Follows up with nephrology.  Denies any dysuria or hematuria. Denies any LE swelling currently.  Insomnia: He still has difficulty maintaining sleep despite taking trazodone  25 mg QD. Denies anhedonia, SI or HI currently.   Past Medical History:  Diagnosis Date   Atherosclerotic heart disease of native coronary artery with unspecified angina pectoris (HCC)    CAD (coronary artery disease), native coronary artery    cath 01/12/2016 50% ost D1, 60% prox LCx, 100% distal RCA lesion treated with DES   Carpal tunnel syndrome, bilateral upper limbs    Chronic systolic (congestive) heart failure (HCC)    CKD (chronic kidney disease), stage III (HCC) 01/15/2016   Diabetes mellitus without complication (HCC)    Essential (primary) hypertension    Hypertension    Hypertensive chronic kidney disease with  stage 1 through stage 4 chronic kidney disease, or unspecified chronic kidney disease    Ischemic cardiomyopathy    Echo 01/13/2016 EF 40-45%   Kidney disease, chronic, stage III (moderate, EGFR 30-59 ml/min) (HCC)    Obesity, unspecified    Reflux esophagitis    Rheumatoid arthritis, unspecified (HCC)    ST elevation myocardial infarction (STEMI) of inferior wall (HCC) 01/12/2016   Subsequent ST elevation (STEMI) myocardial infarction of anterior wall (HCC)    Type 2 diabetes mellitus with diabetic nephropathy (HCC)    Type 2 diabetes mellitus with unspecified complications [facility])     Past Surgical History:  Procedure Laterality Date   APPENDECTOMY     CARDIAC CATHETERIZATION N/A 01/12/2016   Procedure: Left Heart Cath and Coronary Angiography;  Surgeon: Ozell Fell, MD;  Location: [facility] INVASIVE CV LAB;  Service: Cardiovascular;  Laterality: N/A;   CARDIAC CATHETERIZATION N/A 01/12/2016   Procedure: Coronary Stent Intervention;  Surgeon: Ozell Fell, MD;  Location: [facility] INVASIVE CV LAB;  Service: Cardiovascular;  Laterality: N/A;   CATARACT EXTRACTION W/PHACO Right 12/30/2019   Procedure: CATARACT EXTRACTION PHACO AND INTRAOCULAR LENS PLACEMENT (IOC);  Surgeon: Harrie Agent, MD;  Location: [facility];  Service: Ophthalmology;  Laterality: Right;  CDE: 4.06   CATARACT EXTRACTION W/PHACO Left 01/13/2020   Procedure: CATARACT EXTRACTION PHACO AND INTRAOCULAR LENS PLACEMENT (IOC);  Surgeon: Harrie Agent, MD;  Location: [facility];  Service: Ophthalmology;  Laterality: Left;  CDE:  6.68   CORONARY STENT PLACEMENT  01/12/16    Family History  Problem Relation Age of Onset   Ovarian cancer Mother    Diabetes Mother    Hypertension Mother    Prostate cancer Father    Heart attack Neg Hx    Stroke Neg Hx     Social History   Socioeconomic History   Marital status: Widowed    Spouse name: Not on file   Number of children: Not on file   Years of education: Not on file   Highest education level:  Not on file  Occupational History   Not on file  Tobacco Use   Smoking status: Never   Smokeless tobacco: Never  Vaping Use   Vaping status: Never Used  Substance and Sexual Activity   Alcohol use: No   Drug use: No   Sexual activity: Not on file  Other Topics Concern   Not on file  Social History Narrative   Not on file   Social Drivers of Health   Financial Resource Strain: Low Risk  (03/15/2023)   Overall Financial Resource Strain (CARDIA)    Difficulty of Paying Living Expenses: Not hard at all  Food Insecurity: No Food Insecurity (03/15/2023)   Hunger Vital Sign    Worried About Running Out of Food in the Last Year: Never true    Ran Out of Food in the Last Year: Never true  Transportation Needs: No Transportation Needs (03/15/2023)   PRAPARE - Administrator, Civil Service (Medical): No    Lack of Transportation (Non-Medical): No  Physical Activity: Sufficiently Active (03/15/2023)   Exercise Vital Sign    Days of Exercise per Week: 7 days    Minutes of Exercise per Session: 60 min  Stress: No Stress Concern Present (03/15/2023)   [employer] of Occupational Health - Occupational Stress Questionnaire    Feeling of Stress : Not at all  Social Connections: Moderately Isolated (03/15/2023)   Social Connection and Isolation Panel [NHANES]    Frequency of Communication with Friends and Family: Twice a week    Frequency of Social Gatherings with Friends and Family: Twice a week    Attends Religious Services: More than 4 times per year    Active Member of [employer] or Organizations: No    Attends Banker Meetings: Never    Marital Status: Widowed  Intimate Partner Violence: Not At Risk (03/15/2023)   Humiliation, Afraid, Rape, and Kick questionnaire    Fear of Current or Ex-Partner: No    Emotionally Abused: No    Physically Abused: No    Sexually Abused: No    Outpatient Medications Prior to Visit  Medication Sig Dispense Refill   aspirin  81  MG chewable tablet Chew 1 tablet (81 mg total) by mouth daily.     Blood Glucose Monitoring Suppl DEVI 1 each by Does not apply route in the morning, at noon, and at bedtime. May substitute to any manufacturer covered by patient's insurance. 1 each 0   empagliflozin (JARDIANCE) 10 MG TABS tablet 1 tablet Orally Once a day     famotidine  (PEPCID ) 40 MG tablet Take 40 mg by mouth daily.     glipiZIDE  (GLUCOTROL  XL) 5 MG 24 hr tablet Take 1 tablet (5 mg total) by mouth daily with breakfast. 90 tablet 1   Insulin  Pen Needle (BD PEN NEEDLE NANO 2ND GEN) 32G X 4 MM MISC 1 each by Does not apply route daily. 100 each  5   leflunomide (ARAVA) 20 MG tablet Take 20 mg by mouth daily.     Multiple Vitamins-Minerals (MULTIVITAMIN ADULTS 50+ PO) Take 1 tablet by mouth daily. Men     mupirocin  ointment (BACTROBAN ) 2 % Apply 1 Application topically 2 (two) times daily. 22 g 0   nitroGLYCERIN  (NITROSTAT ) 0.4 MG SL tablet Place 1 tablet (0.4 mg total) under the tongue every 5 (five) minutes x 3 doses as needed for chest pain. 25 tablet 3   tamsulosin  (FLOMAX ) 0.4 MG CAPS capsule Take 1 capsule (0.4 mg total) by mouth daily. 90 capsule 1   traZODone  (DESYREL ) 50 MG tablet TAKE 0.5-1 TABLETS BY MOUTH AT BEDTIME AS NEEDED FOR SLEEP. 90 tablet 2   triamcinolone  ointment (KENALOG ) 0.5 % Apply 1 Application topically 2 (two) times daily. As needed 30 g 2   vitamin B-12 (CYANOCOBALAMIN ) 500 MCG tablet Take 1,000 mcg by mouth daily.      amLODipine  (NORVASC ) 5 MG tablet TAKE 1 TABLET (5 MG TOTAL) BY MOUTH DAILY. 90 tablet 1   atorvastatin  (LIPITOR ) 80 MG tablet Take 1 tablet (80 mg total) by mouth daily at 6 PM. 90 tablet 3   hydrALAZINE  (APRESOLINE ) 50 MG tablet TAKE 1 TABLET BY MOUTH THREE TIMES A DAY 270 tablet 2   metoprolol  succinate (TOPROL -XL) 50 MG 24 hr tablet Take 1 tablet (50 mg total) by mouth daily. 90 tablet 3   No facility-administered medications prior to visit.    Allergies  Allergen Reactions    Losartan  Potassium Other (See Comments)    ROS Review of Systems  Constitutional:  Negative for chills and fever.  HENT:  Negative for congestion and sore throat.   Eyes:  Negative for pain and discharge.  Respiratory:  Negative for cough and shortness of breath.   Cardiovascular:  Negative for chest pain and palpitations.  Gastrointestinal:  Negative for constipation, diarrhea, nausea and vomiting.  Endocrine: Negative for polydipsia and polyuria.  Genitourinary:  Negative for dysuria and hematuria.  Musculoskeletal:  Positive for arthralgias. Negative for neck pain and neck stiffness.  Skin:        Bumps on back  Neurological:  Negative for dizziness, weakness, numbness and headaches.  Psychiatric/Behavioral:  Negative for agitation and behavioral problems.       Objective:    Physical Exam Vitals reviewed.  Constitutional:      General: He is not in acute distress.    Appearance: He is not diaphoretic.  HENT:     Head: Normocephalic and atraumatic.     Nose: Nose normal.     Mouth/Throat:     Mouth: Mucous membranes are moist.  Eyes:     General: No scleral icterus.    Extraocular Movements: Extraocular movements intact.  Cardiovascular:     Rate and Rhythm: Normal rate and regular rhythm.     Pulses: Normal pulses.     Heart sounds: Normal heart sounds. No murmur heard. Pulmonary:     Breath sounds: Normal breath sounds. No wheezing or rales.  Musculoskeletal:     Cervical back: Neck supple. No tenderness.     Right lower leg: No edema.     Left lower leg: No edema.  Skin:    General: Skin is warm.     Findings: Rash (Lichenified eczema over left leg near ankle) present.     Comments: Has cysts  over back - 2 cysts over left scapula area, 1 cyst over thoracic spine area, 1 ruptured cyst over  right thoracic area, 1 cyst over lower lumbar area - see image in media section, nontender cysts  Neurological:     General: No focal deficit present.     Mental Status: He  is alert and oriented to person, place, and time.     Sensory: No sensory deficit.     Motor: No weakness.  Psychiatric:        Mood and Affect: Mood normal.        Behavior: Behavior normal.     BP (!) 146/72 (BP Location: Right Arm)   Pulse 81   Ht 5' 8.5 (1.74 m)   Wt 220 lb 9.6 oz (100.1 kg)   SpO2 96%   BMI 33.05 kg/m  Wt Readings from Last 3 Encounters:  11/21/23 220 lb 9.6 oz (100.1 kg)  08/16/23 210 lb 9.6 oz (95.5 kg)  03/23/23 220 lb 9.6 oz (100.1 kg)    Lab Results  Component Value Date   TSH 3.390 12/22/2022   Lab Results  Component Value Date   WBC 6.8 06/27/2019   HGB 12.5 (L) 06/27/2019   HCT 42.4 06/27/2019   MCV 71.7 (L) 06/27/2019   PLT 220 06/27/2019   Lab Results  Component Value Date   NA 141 12/22/2022   K 4.2 12/22/2022   CO2 19 (L) 12/22/2022   GLUCOSE 131 (H) 12/22/2022   BUN 26 12/22/2022   CREATININE 2.70 (H) 12/22/2022   BILITOT 0.3 12/22/2022   ALKPHOS 100 12/22/2022   AST 24 12/22/2022   ALT 16 12/22/2022   PROT 6.4 12/22/2022   ALBUMIN 4.0 12/22/2022   CALCIUM  9.4 12/22/2022   ANIONGAP 11 06/27/2019   EGFR 22 (L) 12/22/2022   Lab Results  Component Value Date   CHOL 88 (L) 12/22/2022   Lab Results  Component Value Date   HDL 33 (L) 12/22/2022   Lab Results  Component Value Date   LDLCALC 34 12/22/2022   Lab Results  Component Value Date   TRIG 115 12/22/2022   Lab Results  Component Value Date   CHOLHDL 2.7 12/22/2022   Lab Results  Component Value Date   HGBA1C 5.4 03/23/2023      Assessment & Plan:   Problem List Items Addressed This Visit       Cardiovascular and Mediastinum   Essential (primary) hypertension - Primary   BP Readings from Last 1 Encounters:  11/21/23 (!) 146/72   Elevated today as he has run out of amlodipine  and metoprolol  Overall remains well-controlled with Amlodipine  5 mg QD, Metoprolol  50 mg QD and Hydralazine  50 mg 3 times daily Counseled for compliance with the  medications Advised DASH diet and moderate exercise/walking as tolerated      Relevant Medications   amLODipine  (NORVASC ) 5 MG tablet   atorvastatin  (LIPITOR ) 80 MG tablet   hydrALAZINE  (APRESOLINE ) 50 MG tablet   metoprolol  succinate (TOPROL -XL) 50 MG 24 hr tablet     Endocrine   Type 2 diabetes mellitus with diabetic nephropathy (HCC)   Lab Results  Component Value Date   HGBA1C 5.4 03/23/2023   Well-controlled On Glipizide  5 mg QD, was on Lantus, DCed as he has very tightly controlled glycemic profile currently, plan to DC Glipizide  later as well On Jardiance now for CKD and HFpEF Advised to follow diabetic diet On statin Diabetic eye exam: Advised to follow up with Ophthalmology for diabetic eye exam      Relevant Medications   atorvastatin  (LIPITOR ) 80 MG tablet   Other  Relevant Orders   Bayer DCA Hb A1c Waived     Genitourinary   CKD (chronic kidney disease) stage 4, GFR 15-29 ml/min (HCC)   Last BMP reviewed - GFR stays around 25 now Followed by Nephrology - Dr. Rachele On Jardiance Avoid nephrotoxic agents including NSAIDs        Other   Primary insomnia   Sleep hygiene discussed, material provided On trazodone  25 mg as needed for insomnia - advised to take 50 mg as needed as he still has insomnia      Mixed hyperlipidemia   On Lipitor  80 mg once daily - needs to be compliant Checked lipid profile      Relevant Medications   amLODipine  (NORVASC ) 5 MG tablet   atorvastatin  (LIPITOR ) 80 MG tablet   hydrALAZINE  (APRESOLINE ) 50 MG tablet   metoprolol  succinate (TOPROL -XL) 50 MG 24 hr tablet    Meds ordered this encounter  Medications   amLODipine  (NORVASC ) 5 MG tablet    Sig: Take 1 tablet (5 mg total) by mouth daily.    Dispense:  90 tablet    Refill:  3   atorvastatin  (LIPITOR ) 80 MG tablet    Sig: Take 1 tablet (80 mg total) by mouth daily at 6 PM.    Dispense:  90 tablet    Refill:  3   hydrALAZINE  (APRESOLINE ) 50 MG tablet    Sig: Take 1  tablet (50 mg total) by mouth 3 (three) times daily.    Dispense:  270 tablet    Refill:  2   metoprolol  succinate (TOPROL -XL) 50 MG 24 hr tablet    Sig: Take 1 tablet (50 mg total) by mouth daily.    Dispense:  90 tablet    Refill:  3    Follow-up: Return in about 3 months (around 02/19/2024) for Annual physical.    Suzzane MARLA Blanch, MD

## 2023-11-21 NOTE — Assessment & Plan Note (Addendum)
 Lab Results  Component Value Date   HGBA1C 5.4 03/23/2023   Well-controlled On Glipizide  5 mg QD, was on Lantus, DCed as he has very tightly controlled glycemic profile currently, plan to DC Glipizide  later as well On Jardiance now for CKD and HFpEF Advised to follow diabetic diet On statin Diabetic eye exam: Advised to follow up with Ophthalmology for diabetic eye exam

## 2023-11-21 NOTE — Patient Instructions (Addendum)
 Please start taking Amlodipine  and Metoprolol  as prescribed for blood pressure. Please continue taking Hydralazine  as prescribed.  Please continue to take other medications as prescribed.  Please continue to follow low carb diet and ambulate as tolerated.  Please bring your medicines in the next visit.

## 2023-11-21 NOTE — Assessment & Plan Note (Addendum)
 Last BMP reviewed - GFR stays around 25 now Followed by Nephrology - Dr. Wolfgang Phoenix On Jardiance Avoid nephrotoxic agents including NSAIDs

## 2023-11-25 LAB — BAYER DCA HB A1C WAIVED: HB A1C (BAYER DCA - WAIVED): 5.4 % (ref 4.8–5.6)

## 2023-12-30 ENCOUNTER — Other Ambulatory Visit: Payer: Self-pay | Admitting: Internal Medicine

## 2023-12-30 DIAGNOSIS — E1121 Type 2 diabetes mellitus with diabetic nephropathy: Secondary | ICD-10-CM

## 2024-01-24 ENCOUNTER — Emergency Department (HOSPITAL_COMMUNITY): Admission: EM | Admit: 2024-01-24 | Discharge: 2024-01-24 | Disposition: A | Discharge status: Home / Self Care

## 2024-01-24 ENCOUNTER — Other Ambulatory Visit: Payer: Self-pay

## 2024-01-24 ENCOUNTER — Encounter (HOSPITAL_COMMUNITY): Payer: Self-pay

## 2024-01-24 DIAGNOSIS — Z79899 Other long term (current) drug therapy: Secondary | ICD-10-CM | POA: Insufficient documentation

## 2024-01-24 DIAGNOSIS — N179 Acute kidney failure, unspecified: Secondary | ICD-10-CM | POA: Insufficient documentation

## 2024-01-24 DIAGNOSIS — Z794 Long term (current) use of insulin: Secondary | ICD-10-CM | POA: Diagnosis not present

## 2024-01-24 DIAGNOSIS — I129 Hypertensive chronic kidney disease with stage 1 through stage 4 chronic kidney disease, or unspecified chronic kidney disease: Secondary | ICD-10-CM | POA: Insufficient documentation

## 2024-01-24 DIAGNOSIS — N189 Chronic kidney disease, unspecified: Secondary | ICD-10-CM | POA: Diagnosis not present

## 2024-01-24 DIAGNOSIS — Z7982 Long term (current) use of aspirin: Secondary | ICD-10-CM | POA: Diagnosis not present

## 2024-01-24 LAB — CBC WITH DIFFERENTIAL/PLATELET
Abs Immature Granulocytes: 0.03 10*3/uL (ref 0.00–0.07)
Basophils Absolute: 0.1 10*3/uL (ref 0.0–0.1)
Basophils Relative: 1 %
Eosinophils Absolute: 0.4 10*3/uL (ref 0.0–0.5)
Eosinophils Relative: 5 %
HCT: 38.5 % — ABNORMAL LOW (ref 39.0–52.0)
Hemoglobin: 11.8 g/dL — ABNORMAL LOW (ref 13.0–17.0)
Immature Granulocytes: 0 %
Lymphocytes Relative: 19 %
Lymphs Abs: 1.6 10*3/uL (ref 0.7–4.0)
MCH: 21.7 pg — ABNORMAL LOW (ref 26.0–34.0)
MCHC: 30.6 g/dL (ref 30.0–36.0)
MCV: 70.6 fL — ABNORMAL LOW (ref 80.0–100.0)
Monocytes Absolute: 1 10*3/uL (ref 0.1–1.0)
Monocytes Relative: 12 %
Neutro Abs: 5 10*3/uL (ref 1.7–7.7)
Neutrophils Relative %: 63 %
Platelets: 267 10*3/uL (ref 150–400)
RBC: 5.45 MIL/uL (ref 4.22–5.81)
RDW: 18.5 % — ABNORMAL HIGH (ref 11.5–15.5)
WBC: 8 10*3/uL (ref 4.0–10.5)
nRBC: 0 % (ref 0.0–0.2)

## 2024-01-24 LAB — COMPREHENSIVE METABOLIC PANEL
ALT: 13 U/L (ref 0–44)
AST: 21 U/L (ref 15–41)
Albumin: 3.7 g/dL (ref 3.5–5.0)
Alkaline Phosphatase: 94 U/L (ref 38–126)
Anion gap: 9 (ref 5–15)
BUN: 30 mg/dL — ABNORMAL HIGH (ref 8–23)
CO2: 22 mmol/L (ref 22–32)
Calcium: 8.8 mg/dL — ABNORMAL LOW (ref 8.9–10.3)
Chloride: 105 mmol/L (ref 98–111)
Creatinine, Ser: 3.47 mg/dL — ABNORMAL HIGH (ref 0.61–1.24)
GFR, Estimated: 16 mL/min — ABNORMAL LOW (ref >60)
Glucose, Bld: 110 mg/dL — ABNORMAL HIGH (ref 70–99)
Potassium: 3.7 mmol/L (ref 3.5–5.1)
Sodium: 136 mmol/L (ref 135–145)
Total Bilirubin: 0.3 mg/dL (ref 0.0–1.2)
Total Protein: 7.6 g/dL (ref 6.5–8.1)

## 2024-01-24 MED ORDER — AMLODIPINE BESYLATE 5 MG PO TABS
5.0000 mg | ORAL_TABLET | Freq: Once | ORAL | Status: AC
  Administered 2024-01-24: 5 mg via ORAL
  Filled 2024-01-24: qty 1

## 2024-01-24 MED ORDER — HYDRALAZINE HCL 25 MG PO TABS
50.0000 mg | ORAL_TABLET | Freq: Once | ORAL | Status: AC
  Administered 2024-01-24: 50 mg via ORAL
  Filled 2024-01-24: qty 2

## 2024-01-24 NOTE — Discharge Instructions (Addendum)
 Please follow up with your doctor tomorrow in regards to your labs.  Your creatinine level was elevated.  Return to the ER for worsening symptoms.

## 2024-01-24 NOTE — ED Notes (Signed)
 pt stated he did not take his BP med today. Last two were over 180. EDP was notified.

## 2024-01-24 NOTE — ED Notes (Addendum)
 Pt's bp was elevated. Stated he has not taken his BP medication today. EDP was notified

## 2024-01-24 NOTE — ED Triage Notes (Signed)
 Pt arrived via POV from home reporting he received a phone call from his doctor advising him to come to the ED to have his blood checked prior to his appointment tomorrow. Pt presents in NAD.

## 2024-01-24 NOTE — ED Provider Notes (Signed)
 [facility] AT [facility] Provider Note   CSN: [id number] Arrival date & time: 01/24/24  1346     History  Chief Complaint  Patient presents with   Labs Only    Darrell Marshall is a 89 y.o. male.  89 year old male with past medical history of hypertension and chronic kidney disease presenting to the emergency department today requesting labs for his nephrology appointment tomorrow.  The patient has an appointment scheduled tomorrow.  He tried to go across the street where he normally goes but there is some Holiday representative so apparently called his doctor's office and he was told to come to the ER for further evaluation.  The patient denies any acute symptoms at this time.  He states he has been feeling well.  Denies any chest pain or shortness of breath.  He reports he has been urinating normally.  Denies any nausea or vomiting.        Home Medications Prior to Admission medications   Medication Sig Start Date End Date Taking? Authorizing Provider  amLODipine (NORVASC) 5 MG tablet Take 1 tablet (5 mg total) by mouth daily. 11/21/23   Anabel Halon, MD  aspirin 81 MG chewable tablet Chew 1 tablet (81 mg total) by mouth daily. 01/15/16   Azalee Course, PA  atorvastatin (LIPITOR) 80 MG tablet Take 1 tablet (80 mg total) by mouth daily at 6 PM. 11/21/23   Anabel Halon, MD  Blood Glucose Monitoring Suppl DEVI 1 each by Does not apply route in the morning, at noon, and at bedtime. May substitute to any manufacturer covered by patient's insurance. 06/27/23   Anabel Halon, MD  empagliflozin (JARDIANCE) 10 MG TABS tablet 1 tablet Orally Once a day    [provider]  famotidine (PEPCID) 40 MG tablet Take 40 mg by mouth daily. 01/16/14   [provider]  glipiZIDE (GLUCOTROL XL) 5 MG 24 hr tablet TAKE 1 TABLET BY MOUTH EVERY DAY WITH BREAKFAST 01/01/24   Anabel Halon, MD  hydrALAZINE (APRESOLINE) 50 MG tablet Take 1 tablet (50 mg total) by mouth 3  (three) times daily. 11/21/23   Anabel Halon, MD  Insulin Pen Needle (BD PEN NEEDLE NANO 2ND GEN) 32G X 4 MM MISC 1 each by Does not apply route daily. 06/27/23   Anabel Halon, MD  leflunomide (ARAVA) 20 MG tablet Take 20 mg by mouth daily.    [provider]  metoprolol succinate (TOPROL-XL) 50 MG 24 hr tablet Take 1 tablet (50 mg total) by mouth daily. 11/21/23   Anabel Halon, MD  Multiple Vitamins-Minerals (MULTIVITAMIN ADULTS 50+ PO) Take 1 tablet by mouth daily. Men    [provider]  mupirocin ointment (BACTROBAN) 2 % Apply 1 Application topically 2 (two) times daily. 08/16/23   Anabel Halon, MD  nitroGLYCERIN (NITROSTAT) 0.4 MG SL tablet Place 1 tablet (0.4 mg total) under the tongue every 5 (five) minutes x 3 doses as needed for chest pain. 01/15/16   Azalee Course, PA  tamsulosin (FLOMAX) 0.4 MG CAPS capsule Take 1 capsule (0.4 mg total) by mouth daily. 06/27/23   Anabel Halon, MD  traZODone (DESYREL) 50 MG tablet TAKE 0.5-1 TABLETS BY MOUTH AT BEDTIME AS NEEDED FOR SLEEP. 09/07/23   Anabel Halon, MD  triamcinolone ointment (KENALOG) 0.5 % Apply 1 Application topically 2 (two) times daily. As needed 12/22/22   Anabel Halon, MD  vitamin B-12 (CYANOCOBALAMIN) 500 MCG tablet  Take 1,000 mcg by mouth daily.     [provider]      Allergies    Losartan potassium    Review of Systems   Review of Systems  Reason unable to perform ROS: Negative ROS.  All other systems reviewed and are negative.   Physical Exam Updated Vital Signs BP (!) 162/88 (BP Location: Left Arm)   Pulse 62   Temp 97.9 F (36.6 C) (Oral)   Resp 18   Ht 5' 8.5" (1.74 m)   Wt 100 kg   SpO2 98%   BMI 33.03 kg/m  Physical Exam Vitals and nursing note reviewed.   Gen: NAD Eyes: PERRL, EOMI HEENT: no oropharyngeal swelling Neck: trachea midline Resp: clear to auscultation bilaterally Card: RRR, no murmurs, rubs, or gallops Abd: nontender, nondistended Extremities: no  calf tenderness, no edema Vascular: 2+ radial pulses bilaterally, 2+ DP pulses bilaterally Skin: no rashes Psyc: acting appropriately   ED Results / Procedures / Treatments   Labs (all labs ordered are listed, but only abnormal results are displayed) Labs Reviewed  COMPREHENSIVE METABOLIC PANEL - Abnormal; Notable for the following components:      Result Value   Glucose, Bld 110 (*)    BUN 30 (*)    Creatinine, Ser 3.47 (*)    Calcium 8.8 (*)    GFR, Estimated 16 (*)    All other components within normal limits  CBC WITH DIFFERENTIAL/PLATELET - Abnormal; Notable for the following components:   Hemoglobin 11.8 (*)    HCT 38.5 (*)    MCV 70.6 (*)    MCH 21.7 (*)    RDW 18.5 (*)    All other components within normal limits    EKG None  Radiology No results found.  Procedures Procedures    Medications Ordered in ED Medications  amLODipine (NORVASC) tablet 5 mg (5 mg Oral Given 01/24/24 1858)  hydrALAZINE (APRESOLINE) tablet 50 mg (50 mg Oral Given 01/24/24 1858)    ED Course/ Medical Decision Making/ A&P                                 Medical Decision Making 89 year old male with past medical history of chronic kidney disease and hypertension presenting to the emergency department today with request to have his labs checked.  The patient's creatinine is elevated compared to his baseline.  His blood pressure is also elevated here.  He states that he forgot to take his blood pressure medications this morning.  I have given him a dose here.  I will try to get in touch with one of our nephrologist here to see if any further workup is needed from the emergent standpoint but this seems to be more or less his routine lab testing and he is denying any symptoms with this.  He does not have any symptoms of endorgan dysfunction on physical exam such as CVA or any chest pain to suggest heart strain.  I will reevaluate for ultimate disposition.  The patient does have an AKI here.  I  did try to get in touch with his nephrologist.  I was unable to do so.  Calls placed to the on-call nephrologist and a secure chat was sent as well.  The patient's blood pressure improved with his blood pressure medications.  He is feeling better and is requesting discharge.  I did discuss this with the patient and offered admission for nephrology evaluation  versus going home with outpatient follow-up tomorrow.  He is ultimately discharged through shared decision making.  Amount and/or Complexity of Data Reviewed Labs: ordered.  Risk Prescription drug management.           Final Clinical Impression(s) / ED Diagnoses Final diagnoses:  AKI (acute kidney injury) [facility])    Rx / DC Orders ED Discharge Orders     None         Durwin Glaze, MD 01/24/24 2011

## 2024-01-25 ENCOUNTER — Other Ambulatory Visit (HOSPITAL_COMMUNITY): Payer: Self-pay | Admitting: Nephrology

## 2024-01-25 DIAGNOSIS — N184 Chronic kidney disease, stage 4 (severe): Secondary | ICD-10-CM | POA: Diagnosis not present

## 2024-01-25 DIAGNOSIS — I129 Hypertensive chronic kidney disease with stage 1 through stage 4 chronic kidney disease, or unspecified chronic kidney disease: Secondary | ICD-10-CM

## 2024-01-25 DIAGNOSIS — N179 Acute kidney failure, unspecified: Secondary | ICD-10-CM | POA: Diagnosis not present

## 2024-01-25 DIAGNOSIS — R809 Proteinuria, unspecified: Secondary | ICD-10-CM | POA: Diagnosis not present

## 2024-01-25 DIAGNOSIS — I5032 Chronic diastolic (congestive) heart failure: Secondary | ICD-10-CM | POA: Diagnosis not present

## 2024-02-05 ENCOUNTER — Ambulatory Visit (HOSPITAL_COMMUNITY): Admission: RE | Admit: 2024-02-05 | Source: Ambulatory Visit

## 2024-02-05 ENCOUNTER — Encounter (HOSPITAL_COMMUNITY): Payer: Self-pay

## 2024-02-19 ENCOUNTER — Encounter: Payer: PPO | Admitting: Internal Medicine

## 2024-02-20 ENCOUNTER — Ambulatory Visit: Payer: Self-pay

## 2024-02-20 DIAGNOSIS — I10 Essential (primary) hypertension: Secondary | ICD-10-CM | POA: Diagnosis not present

## 2024-02-20 DIAGNOSIS — E119 Type 2 diabetes mellitus without complications: Secondary | ICD-10-CM | POA: Diagnosis not present

## 2024-02-20 DIAGNOSIS — T148XXA Other injury of unspecified body region, initial encounter: Secondary | ICD-10-CM | POA: Diagnosis not present

## 2024-02-20 DIAGNOSIS — Z888 Allergy status to other drugs, medicaments and biological substances status: Secondary | ICD-10-CM | POA: Diagnosis not present

## 2024-02-20 DIAGNOSIS — W109XXA Fall (on) (from) unspecified stairs and steps, initial encounter: Secondary | ICD-10-CM | POA: Diagnosis not present

## 2024-02-20 DIAGNOSIS — Z7984 Long term (current) use of oral hypoglycemic drugs: Secondary | ICD-10-CM | POA: Diagnosis not present

## 2024-02-20 DIAGNOSIS — S0990XA Unspecified injury of head, initial encounter: Secondary | ICD-10-CM | POA: Diagnosis not present

## 2024-02-20 DIAGNOSIS — R9082 White matter disease, unspecified: Secondary | ICD-10-CM | POA: Diagnosis not present

## 2024-02-20 DIAGNOSIS — S0081XA Abrasion of other part of head, initial encounter: Secondary | ICD-10-CM | POA: Diagnosis not present

## 2024-02-20 DIAGNOSIS — I252 Old myocardial infarction: Secondary | ICD-10-CM | POA: Diagnosis not present

## 2024-02-20 DIAGNOSIS — Z792 Long term (current) use of antibiotics: Secondary | ICD-10-CM | POA: Diagnosis not present

## 2024-02-20 NOTE — Telephone Encounter (Signed)
 Copied from CRM [id number]. Topic: Clinical - Red Word Triage >> Feb 20, 2024 11:49 AM Marlow Baars wrote: Red Word that prompted transfer to Nurse Triage: Velna Hatchet the patients niece called stating the patient fell on Sunday down a few steps and hit his head. She says he doesn't remember how long he was laying there. He hit his head and his right eye is puffy. He is also really weak. I will transfer her to E2C2 NT  Chief Complaint: fall on Sunday Symptoms: weakness, right eye puffy, lac to hand and forehead Frequency: happened on Sunday Pertinent Negatives: Patient denies NA Disposition: [x] ED /[] Urgent Care (no appt availability in office) / [] Appointment(In office/virtual)/ []  [facility]/ [] Home Care/ [] Refused Recommended Disposition /[] [facility]/ []  Follow-up with PCP Additional Notes: instructed to go to ER.  Pcp office updated.   Reason for Disposition  Injury (or injuries) that need emergency care  Answer Assessment - Initial Assessment Questions 1. MECHANISM: "How did the fall happen?"     He went out back door and fell, hit head, doesn't remember how long his was down 2. DOMESTIC VIOLENCE AND ELDER ABUSE SCREENING: "Did you fall because someone pushed you or tried to hurt you?" If Yes, ask: "Are you safe now?"     Na was home alone 3. ONSET: "When did the fall happen?" (e.g., minutes, hours, or days ago)     Sunday 4. LOCATION: "What part of the body hit the ground?" (e.g., back, buttocks, head, hips, knees, hands, head, stomach)     Head, right eye 5. INJURY: "Did you hurt (injure) yourself when you fell?" If Yes, ask: "What did you injure? Tell me more about this?" (e.g., body area; type of injury; pain severity)"     Right eye and hit head, very week 6. PAIN: "Is there any pain?" If Yes, ask: "How bad is the pain?" (e.g., Scale 1-10; or mild,  moderate, severe)   - NONE (0): No pain   - MILD (1-3): Doesn't interfere with normal activities    - MODERATE (4-7):  Interferes with normal activities or awakens from sleep    - SEVERE (8-10): Excruciating pain, unable to do any normal activities      Patient not on phone, niece is calling and is not with him 7. SIZE: For cuts, bruises, or swelling, ask: "How large is it?" (e.g., inches or centimeters)      Right hand scrapped and right side of head above eye that is open. 8. PREGNANCY: "Is there any chance you are pregnant?" "When was your last menstrual period?"     na 9. OTHER SYMPTOMS: "Do you have any other symptoms?" (e.g., dizziness, fever, weakness; new onset or worsening).      weakness 10. CAUSE: "What do you think caused the fall (or falling)?" (e.g., tripped, dizzy spell)       Unknown, patient not on phone.  Niece is calling and is not with him.  Protocols used: Falls and [facility]

## 2024-02-21 ENCOUNTER — Telehealth: Payer: Self-pay

## 2024-02-21 NOTE — Transitions of Care (Post Inpatient/ED Visit) (Signed)
 02/21/2024  Name: Darrell WEATHERLY Sr. MRN: [id number] DOB: [birth year]  Today's TOC FU Call Status: Today's TOC FU Call Status:: Successful TOC FU Call Completed TOC FU Call Complete Date: 02/21/24 Patient's Name and Date of Birth confirmed.  Transition Care Management Follow-up Telephone Call Date of Discharge: 02/20/24 Discharge Facility: Other [facility]) Name of Other (Non-[facility]) Discharge Facility: [facility] Type of Discharge: Emergency Department Reason for ED Visit: Other: (Fall) How have you been since you were released from the hospital?: Better Any questions or concerns?: No  Items Reviewed: Did you receive and understand the discharge instructions provided?: Yes Medications obtained,verified, and reconciled?: Yes (Medications Reviewed) Any new allergies since your discharge?: No Dietary orders reviewed?: NA Do you have support at home?: Yes People in Home: other relative(s)  Medications Reviewed Today: Medications Reviewed Today     Reviewed by Anthoney Harada, LPN (Licensed Practical Nurse) on 02/21/24 at 1318  Med List Status: <None>   Medication Order Taking? Sig Documenting Provider Last Dose Status Informant  amLODipine (NORVASC) 5 MG tablet [id number] Yes Take 1 tablet (5 mg total) by mouth daily. Anabel Halon, MD Taking Active   aspirin 81 MG chewable tablet [id number] Yes Chew 1 tablet (81 mg total) by mouth daily. Azalee Course, [place] Taking Active Family Member  atorvastatin (LIPITOR) 80 MG tablet [id number] Yes Take 1 tablet (80 mg total) by mouth daily at 6 PM. Anabel Halon, MD Taking Active   Blood Glucose Monitoring Suppl DEVI [id number] Yes 1 each by Does not apply route in the morning, at noon, and at bedtime. May substitute to any manufacturer covered by patient's insurance. Anabel Halon, MD Taking Active   empagliflozin (JARDIANCE) 10 MG TABS tablet [id number] Yes 1 tablet Orally Once a day [provider] Taking Active   famotidine  (PEPCID) 40 MG tablet [id number] Yes Take 40 mg by mouth daily. [provider] Taking Active Family Member           Med Note Serina Cowper   Wed Jan 13, 2016  1:59 PM)    glipiZIDE (GLUCOTROL XL) 5 MG 24 hr tablet [id number] Yes TAKE 1 TABLET BY MOUTH EVERY DAY WITH BREAKFAST Anabel Halon, MD Taking Active   hydrALAZINE (APRESOLINE) 50 MG tablet [id number] Yes Take 1 tablet (50 mg total) by mouth 3 (three) times daily. Anabel Halon, MD Taking Active   Insulin Pen Needle (BD PEN NEEDLE NANO 2ND GEN) 32G X 4 MM MISC [id number] Yes 1 each by Does not apply route daily. Anabel Halon, MD Taking Active   leflunomide (ARAVA) 20 MG tablet [id number] Yes Take 20 mg by mouth daily. [provider] Taking Active Family Member  metoprolol succinate (TOPROL-XL) 50 MG 24 hr tablet [id number] Yes Take 1 tablet (50 mg total) by mouth daily. Anabel Halon, MD Taking Active   Multiple Vitamins-Minerals (MULTIVITAMIN ADULTS 50+ PO) [id number] Yes Take 1 tablet by mouth daily. Men [provider] Taking Active Family Member  mupirocin ointment (BACTROBAN) 2 % [id number] Yes Apply 1 Application topically 2 (two) times daily. Anabel Halon, MD Taking Active   nitroGLYCERIN (NITROSTAT) 0.4 MG SL tablet [id number] Yes Place 1 tablet (0.4 mg total) under the tongue every 5 (five) minutes x 3 doses as needed for chest pain. Azalee Course, [place] Taking Active Family Member  tamsulosin [facility]) 0.4 MG CAPS capsule [id number] Yes Take 1 capsule (0.4 mg total) by mouth daily. Anabel Halon, MD Taking  Active   traZODone (DESYREL) 50 MG tablet [id number] Yes TAKE 0.5-1 TABLETS BY MOUTH AT BEDTIME AS NEEDED FOR SLEEP. Anabel Halon, MD Taking Active   triamcinolone ointment (KENALOG) 0.5 % [id number] Yes Apply 1 Application topically 2 (two) times daily. As needed Anabel Halon, MD Taking Active   vitamin B-12 (CYANOCOBALAMIN) 500 MCG tablet [id number] Yes Take 1,000 mcg by mouth daily.  [provider] Taking Active Family Member            Home Care and Equipment/Supplies: Were Home Health Services Ordered?: NA Any new equipment or medical supplies ordered?: NA  Functional Questionnaire: Do you need assistance with bathing/showering or dressing?: No Do you need assistance with meal preparation?: No Do you need assistance with eating?: No Do you have difficulty maintaining continence: No Do you need assistance with getting out of bed/getting out of a chair/moving?: No Do you have difficulty managing or taking your medications?: No  Follow up appointments reviewed: PCP Follow-up appointment confirmed?: NA Specialist Hospital Follow-up appointment confirmed?: NA Do you need transportation to your follow-up appointment?: No Do you understand care options if your condition(s) worsen?: Yes-patient verbalized understanding    SIGNATURE Kandis Fantasia, LPN [facility] Health Advisor [facility] You Are. We Are. [facility] Direct Dial [phone]

## 2024-03-26 ENCOUNTER — Ambulatory Visit (INDEPENDENT_AMBULATORY_CARE_PROVIDER_SITE_OTHER): Payer: PPO | Admitting: Internal Medicine

## 2024-03-26 ENCOUNTER — Encounter: Payer: Self-pay | Admitting: Internal Medicine

## 2024-03-26 VITALS — BP 136/76 | HR 97 | Ht 68.5 in | Wt 221.0 lb

## 2024-03-26 DIAGNOSIS — Z7984 Long term (current) use of oral hypoglycemic drugs: Secondary | ICD-10-CM

## 2024-03-26 DIAGNOSIS — M069 Rheumatoid arthritis, unspecified: Secondary | ICD-10-CM | POA: Diagnosis not present

## 2024-03-26 DIAGNOSIS — E1121 Type 2 diabetes mellitus with diabetic nephropathy: Secondary | ICD-10-CM

## 2024-03-26 DIAGNOSIS — F5101 Primary insomnia: Secondary | ICD-10-CM | POA: Diagnosis not present

## 2024-03-26 DIAGNOSIS — Z0001 Encounter for general adult medical examination with abnormal findings: Secondary | ICD-10-CM | POA: Diagnosis not present

## 2024-03-26 DIAGNOSIS — L280 Lichen simplex chronicus: Secondary | ICD-10-CM | POA: Diagnosis not present

## 2024-03-26 DIAGNOSIS — E782 Mixed hyperlipidemia: Secondary | ICD-10-CM

## 2024-03-26 DIAGNOSIS — I5032 Chronic diastolic (congestive) heart failure: Secondary | ICD-10-CM

## 2024-03-26 DIAGNOSIS — N184 Chronic kidney disease, stage 4 (severe): Secondary | ICD-10-CM

## 2024-03-26 MED ORDER — TRAZODONE HCL 50 MG PO TABS: 50.0000 mg | ORAL_TABLET | Freq: Every evening | ORAL | 2 refills | Status: AC | PRN

## 2024-03-26 NOTE — Patient Instructions (Signed)
 Please start taking Trazodone  as needed for insomnia.  Please start taking Amlodipine , Metoprolol  and Hydralazine  for blood pressure.  Please continue to take medications as prescribed.  Please continue to follow low carb diet and ambulate as tolerated.

## 2024-03-26 NOTE — Assessment & Plan Note (Signed)
 Last BMP reviewed - GFR stays around 20 now, last GFR 16 Followed by Nephrology - Dr. Carrolyn Clan Avoid nephrotoxic agents including NSAIDs Maintain adequate hydration

## 2024-03-26 NOTE — Assessment & Plan Note (Signed)
Kenalog cream prescribed Advised to use lotion/moisturizer 

## 2024-03-26 NOTE — Assessment & Plan Note (Addendum)
 Euvolemic currently No LE edema or dyspnea currently Was on Jardiance, but was recently discontinued due to AKI Had angioedema with ACEi/ARB On Amlodipine  and Metoprolol 

## 2024-03-26 NOTE — Assessment & Plan Note (Signed)
 Lab Results  Component Value Date   HGBA1C 5.4 11/21/2023   Well-controlled On Glipizide  5 mg QD, was on Lantus, DCed as he has very tightly controlled glycemic profile currently, plan to DC Glipizide  later as well DCed Jardiance recently due to AKI by Nephrology Advised to follow diabetic diet On statin Diabetic eye exam: Advised to follow up with Ophthalmology for diabetic eye exam

## 2024-03-26 NOTE — Assessment & Plan Note (Signed)
On Arava Followed by Rheumatology 

## 2024-03-26 NOTE — Assessment & Plan Note (Signed)
 On Lipitor 80 mg once daily - needs to be compliant Checked lipid profile

## 2024-03-26 NOTE — Assessment & Plan Note (Signed)
 Sleep hygiene discussed, material provided Needs to avoid daytime naps On trazodone  25 mg as needed for insomnia - advised to take 50 mg as needed as he still has insomnia

## 2024-03-26 NOTE — Progress Notes (Signed)
 Established Patient Office Visit  Subjective:  Patient ID: Darrell KOOPMANN Sr., male    DOB: 1935-06-23  Age: 89 y.o. MRN: [id number]  CC:  Chief Complaint  Patient presents with   Annual Exam    HPI Darrell EPTING Sr. is a 89 y.o. male with past medical history of CAD s/p stent placement, HTN, type II DM with CKD, RA and HLD who presents for annual physical.  CAD and HTN: BP is elevated today, but he has apparently run out of amlodipine  and metoprolol . Takes hydralazine  50 mg 3 times daily regularly. Patient denies headache, dizziness, chest pain, dyspnea or palpitations.  He follows up with cardiology for history of CAD.  He takes aspirin  and statin.  Type II DM: He takes Glipizide  5 mg QD now. His blood glucose remains controlled.  Jardiance was recently discontinued due to AKI.  His HbA1c was 5.6 today. His blood glucose has been around 110s in the morning.  Denies any episode of hypoglycemia.  Denies any fatigue, polyuria, polyphagia or recent change in weight.  He also takes statin.  CKD: Follows up with nephrology.  Denies any dysuria or hematuria. Denies any LE swelling currently.  Insomnia: He still has difficulty maintaining sleep despite taking trazodone  25 mg QD.  He has stopped taking it.  Of note, he reports daytime naps.  Denies anhedonia, SI or HI currently.   Past Medical History:  Diagnosis Date   Atherosclerotic heart disease of native coronary artery with unspecified angina pectoris (HCC)    CAD (coronary artery disease), native coronary artery    cath 01/12/2016 50% ost D1, 60% prox LCx, 100% distal RCA lesion treated with DES   Carpal tunnel syndrome, bilateral upper limbs    Chronic systolic (congestive) heart failure (HCC)    CKD (chronic kidney disease), stage III (HCC) 01/15/2016   Diabetes mellitus without complication (HCC)    Essential (primary) hypertension    Hypertension    Hypertensive chronic kidney disease with stage 1 through stage 4 chronic kidney  disease, or unspecified chronic kidney disease    Ischemic cardiomyopathy    Echo 01/13/2016 EF 40-45%   Kidney disease, chronic, stage III (moderate, EGFR 30-59 ml/min) (HCC)    Obesity, unspecified    Reflux esophagitis    Rheumatoid arthritis, unspecified (HCC)    ST elevation myocardial infarction (STEMI) of inferior wall (HCC) 01/12/2016   Subsequent ST elevation (STEMI) myocardial infarction of anterior wall (HCC)    Type 2 diabetes mellitus with diabetic nephropathy (HCC)    Type 2 diabetes mellitus with unspecified complications [facility])     Past Surgical History:  Procedure Laterality Date   APPENDECTOMY     CARDIAC CATHETERIZATION N/A 01/12/2016   Procedure: Left Heart Cath and Coronary Angiography;  Surgeon: Arnoldo Lapping, MD;  Location: [facility] INVASIVE CV LAB;  Service: Cardiovascular;  Laterality: N/A;   CARDIAC CATHETERIZATION N/A 01/12/2016   Procedure: Coronary Stent Intervention;  Surgeon: Arnoldo Lapping, MD;  Location: [facility] INVASIVE CV LAB;  Service: Cardiovascular;  Laterality: N/A;   CATARACT EXTRACTION W/PHACO Right 12/30/2019   Procedure: CATARACT EXTRACTION PHACO AND INTRAOCULAR LENS PLACEMENT (IOC);  Surgeon: Tarri Farm, MD;  Location: [facility];  Service: Ophthalmology;  Laterality: Right;  CDE: 4.06   CATARACT EXTRACTION W/PHACO Left 01/13/2020   Procedure: CATARACT EXTRACTION PHACO AND INTRAOCULAR LENS PLACEMENT (IOC);  Surgeon: Tarri Farm, MD;  Location: [facility];  Service: Ophthalmology;  Laterality: Left;  CDE: 6.68   CORONARY STENT PLACEMENT  01/12/16  Family History  Problem Relation Age of Onset   Ovarian cancer Mother    Diabetes Mother    Hypertension Mother    Prostate cancer Father    Heart attack Neg Hx    Stroke Neg Hx     Social History   Socioeconomic History   Marital status: Widowed    Spouse name: Not on file   Number of children: Not on file   Years of education: Not on file   Highest education level: Not on file  Occupational History    Not on file  Tobacco Use   Smoking status: Never   Smokeless tobacco: Never  Vaping Use   Vaping status: Never Used  Substance and Sexual Activity   Alcohol use: No   Drug use: No   Sexual activity: Not on file  Other Topics Concern   Not on file  Social History Narrative   Not on file   Social Drivers of Health   Financial Resource Strain: Low Risk  (03/15/2023)   Overall Financial Resource Strain (CARDIA)    Difficulty of Paying Living Expenses: Not hard at all  Food Insecurity: No Food Insecurity (03/15/2023)   Hunger Vital Sign    Worried About Running Out of Food in the Last Year: Never true    Ran Out of Food in the Last Year: Never true  Transportation Needs: No Transportation Needs (03/15/2023)   PRAPARE - Administrator, Civil Service (Medical): No    Lack of Transportation (Non-Medical): No  Physical Activity: Sufficiently Active (03/15/2023)   Exercise Vital Sign    Days of Exercise per Week: 7 days    Minutes of Exercise per Session: 60 min  Stress: No Stress Concern Present (03/15/2023)   [employer] of Occupational Health - Occupational Stress Questionnaire    Feeling of Stress : Not at all  Social Connections: Moderately Isolated (03/15/2023)   Social Connection and Isolation Panel [NHANES]    Frequency of Communication with Friends and Family: Twice a week    Frequency of Social Gatherings with Friends and Family: Twice a week    Attends Religious Services: More than 4 times per year    Active Member of [employer] or Organizations: No    Attends Banker Meetings: Never    Marital Status: Widowed  Intimate Partner Violence: Not At Risk (03/15/2023)   Humiliation, Afraid, Rape, and Kick questionnaire    Fear of Current or Ex-Partner: No    Emotionally Abused: No    Physically Abused: No    Sexually Abused: No    Outpatient Medications Prior to Visit  Medication Sig Dispense Refill   amLODipine  (NORVASC ) 5 MG tablet Take 1 tablet (5  mg total) by mouth daily. 90 tablet 3   aspirin  81 MG chewable tablet Chew 1 tablet (81 mg total) by mouth daily.     atorvastatin  (LIPITOR ) 80 MG tablet Take 1 tablet (80 mg total) by mouth daily at 6 PM. 90 tablet 3   Blood Glucose Monitoring Suppl DEVI 1 each by Does not apply route in the morning, at noon, and at bedtime. May substitute to any manufacturer covered by patient's insurance. 1 each 0   famotidine (PEPCID) 40 MG tablet Take 40 mg by mouth daily.     glipiZIDE  (GLUCOTROL  XL) 5 MG 24 hr tablet TAKE 1 TABLET BY MOUTH EVERY DAY WITH BREAKFAST 90 tablet 1   hydrALAZINE  (APRESOLINE ) 50 MG tablet Take 1 tablet (50 mg total) by  mouth 3 (three) times daily. 270 tablet 2   Insulin  Pen Needle (BD PEN NEEDLE NANO 2ND GEN) 32G X 4 MM MISC 1 each by Does not apply route daily. 100 each 5   leflunomide (ARAVA) 20 MG tablet Take 20 mg by mouth daily.     metoprolol  succinate (TOPROL -XL) 50 MG 24 hr tablet Take 1 tablet (50 mg total) by mouth daily. 90 tablet 3   Multiple Vitamins-Minerals (MULTIVITAMIN ADULTS 50+ PO) Take 1 tablet by mouth daily. Men     mupirocin  ointment (BACTROBAN ) 2 % Apply 1 Application topically 2 (two) times daily. 22 g 0   nitroGLYCERIN  (NITROSTAT ) 0.4 MG SL tablet Place 1 tablet (0.4 mg total) under the tongue every 5 (five) minutes x 3 doses as needed for chest pain. 25 tablet 3   tamsulosin  (FLOMAX ) 0.4 MG CAPS capsule Take 1 capsule (0.4 mg total) by mouth daily. 90 capsule 1   triamcinolone  ointment (KENALOG ) 0.5 % Apply 1 Application topically 2 (two) times daily. As needed 30 g 2   vitamin B-12 (CYANOCOBALAMIN) 500 MCG tablet Take 1,000 mcg by mouth daily.      empagliflozin (JARDIANCE) 10 MG TABS tablet 1 tablet Orally Once a day     traZODone  (DESYREL ) 50 MG tablet TAKE 0.5-1 TABLETS BY MOUTH AT BEDTIME AS NEEDED FOR SLEEP. 90 tablet 2   No facility-administered medications prior to visit.    Allergies  Allergen Reactions   Losartan  Potassium Other (See  Comments)    ROS Review of Systems  Constitutional:  Negative for chills and fever.  HENT:  Negative for congestion and sore throat.   Eyes:  Negative for pain and discharge.  Respiratory:  Negative for cough and shortness of breath.   Cardiovascular:  Negative for chest pain and palpitations.  Gastrointestinal:  Negative for constipation, diarrhea, nausea and vomiting.  Endocrine: Negative for polydipsia and polyuria.  Genitourinary:  Negative for dysuria and hematuria.  Musculoskeletal:  Positive for arthralgias. Negative for neck pain and neck stiffness.  Skin:        Bumps on back  Neurological:  Negative for dizziness, weakness, numbness and headaches.  Psychiatric/Behavioral:  Positive for sleep disturbance. Negative for agitation and behavioral problems.       Objective:    Physical Exam Vitals reviewed.  Constitutional:      General: He is not in acute distress.    Appearance: He is not diaphoretic.  HENT:     Head: Normocephalic and atraumatic.     Nose: Nose normal.     Mouth/Throat:     Mouth: Mucous membranes are moist.  Eyes:     General: No scleral icterus.    Extraocular Movements: Extraocular movements intact.  Cardiovascular:     Rate and Rhythm: Normal rate and regular rhythm.     Heart sounds: Normal heart sounds. No murmur heard. Pulmonary:     Breath sounds: Normal breath sounds. No wheezing or rales.  Abdominal:     Palpations: Abdomen is soft.     Tenderness: There is no abdominal tenderness.  Musculoskeletal:     Cervical back: Neck supple. No tenderness.     Right lower leg: No edema.     Left lower leg: No edema.  Skin:    General: Skin is warm.     Findings: Rash (Lichenified eczema over left leg near ankle) present.  Neurological:     General: No focal deficit present.     Mental Status: He is alert  and oriented to person, place, and time.     Sensory: No sensory deficit.     Motor: No weakness.  Psychiatric:        Mood and Affect:  Mood normal.        Behavior: Behavior normal.     BP 136/76 (BP Location: Left Arm)   Pulse 97   Ht 5' 8.5" (1.74 m)   Wt 221 lb (100.2 kg)   SpO2 97%   BMI 33.11 kg/m  Wt Readings from Last 3 Encounters:  03/26/24 221 lb (100.2 kg)  01/24/24 220 lb 7.4 oz (100 kg)  11/21/23 220 lb 9.6 oz (100.1 kg)    Lab Results  Component Value Date   TSH 3.390 12/22/2022   Lab Results  Component Value Date   WBC 8.0 01/24/2024   HGB 11.8 (L) 01/24/2024   HCT 38.5 (L) 01/24/2024   MCV 70.6 (L) 01/24/2024   PLT 267 01/24/2024   Lab Results  Component Value Date   NA 136 01/24/2024   K 3.7 01/24/2024   CO2 22 01/24/2024   GLUCOSE 110 (H) 01/24/2024   BUN 30 (H) 01/24/2024   CREATININE 3.47 (H) 01/24/2024   BILITOT 0.3 01/24/2024   ALKPHOS 94 01/24/2024   AST 21 01/24/2024   ALT 13 01/24/2024   PROT 7.6 01/24/2024   ALBUMIN 3.7 01/24/2024   CALCIUM  8.8 (L) 01/24/2024   ANIONGAP 9 01/24/2024   EGFR 22 (L) 12/22/2022   Lab Results  Component Value Date   CHOL 88 (L) 12/22/2022   Lab Results  Component Value Date   HDL 33 (L) 12/22/2022   Lab Results  Component Value Date   LDLCALC 34 12/22/2022   Lab Results  Component Value Date   TRIG 115 12/22/2022   Lab Results  Component Value Date   CHOLHDL 2.7 12/22/2022   Lab Results  Component Value Date   HGBA1C 5.4 11/21/2023      Assessment & Plan:   Problem List Items Addressed This Visit       Cardiovascular and Mediastinum   (HFpEF) heart failure with preserved ejection fraction (HCC)   Euvolemic currently No LE edema or dyspnea currently Was on Jardiance, but was recently discontinued due to AKI Had angioedema with ACEi/ARB On Amlodipine  and Metoprolol         Endocrine   Type 2 diabetes mellitus with diabetic nephropathy (HCC)   Lab Results  Component Value Date   HGBA1C 5.4 11/21/2023   Well-controlled On Glipizide  5 mg QD, was on Lantus, DCed as he has very tightly controlled glycemic  profile currently, plan to DC Glipizide  later as well DCed Jardiance recently due to AKI by Nephrology Advised to follow diabetic diet On statin Diabetic eye exam: Advised to follow up with Ophthalmology for diabetic eye exam      Relevant Orders   Bayer DCA Hb A1c Waived     Musculoskeletal and Integument   Rheumatoid arthritis, unspecified (HCC)   On Arava Followed by Rheumatology      Lichenified eczema   Kenalog  cream prescribed Advised to use lotion/moisturizer        Genitourinary   CKD (chronic kidney disease) stage 4, GFR 15-29 ml/min (HCC)   Last BMP reviewed - GFR stays around 20 now, last GFR 16 Followed by Nephrology - Dr. Carrolyn Clan Avoid nephrotoxic agents including NSAIDs Maintain adequate hydration        Other   Encounter for general adult medical examination with abnormal findings -  Primary   Physical exam as documented. Fasting blood tests reviewed today.      Primary insomnia   Sleep hygiene discussed, material provided Needs to avoid daytime naps On trazodone  25 mg as needed for insomnia - advised to take 50 mg as needed as he still has insomnia      Relevant Medications   traZODone  (DESYREL ) 50 MG tablet   Mixed hyperlipidemia   On Lipitor  80 mg once daily - needs to be compliant Checked lipid profile        Meds ordered this encounter  Medications   traZODone  (DESYREL ) 50 MG tablet    Sig: Take 1 tablet (50 mg total) by mouth at bedtime as needed for sleep.    Dispense:  90 tablet    Refill:  2    Dx code-f51.01    Follow-up: Return in about 6 months (around 09/26/2024) for DM and CKD.    Meldon Sport, MD

## 2024-03-26 NOTE — Assessment & Plan Note (Addendum)
Physical exam as documented. Fasting blood tests reviewed today.

## 2024-03-27 LAB — BAYER DCA HB A1C WAIVED: HB A1C (BAYER DCA - WAIVED): 5.6 % (ref 4.8–5.6)

## 2024-04-18 DIAGNOSIS — Z79899 Other long term (current) drug therapy: Secondary | ICD-10-CM | POA: Diagnosis not present

## 2024-04-18 DIAGNOSIS — M170 Bilateral primary osteoarthritis of knee: Secondary | ICD-10-CM | POA: Diagnosis not present

## 2024-04-18 DIAGNOSIS — D649 Anemia, unspecified: Secondary | ICD-10-CM | POA: Diagnosis not present

## 2024-04-18 DIAGNOSIS — M0609 Rheumatoid arthritis without rheumatoid factor, multiple sites: Secondary | ICD-10-CM | POA: Diagnosis not present

## 2024-04-18 DIAGNOSIS — R21 Rash and other nonspecific skin eruption: Secondary | ICD-10-CM | POA: Diagnosis not present

## 2024-04-18 DIAGNOSIS — N1831 Chronic kidney disease, stage 3a: Secondary | ICD-10-CM | POA: Diagnosis not present

## 2024-04-19 LAB — LAB REPORT - SCANNED: EGFR: 14

## 2024-05-20 ENCOUNTER — Other Ambulatory Visit: Payer: Self-pay

## 2024-05-20 MED ORDER — TAMSULOSIN HCL 0.4 MG PO CAPS: 0.4000 mg | ORAL_CAPSULE | Freq: Every day | ORAL | 1 refills | Status: AC

## 2024-06-11 DIAGNOSIS — E611 Iron deficiency: Secondary | ICD-10-CM | POA: Diagnosis not present

## 2024-06-11 DIAGNOSIS — D631 Anemia in chronic kidney disease: Secondary | ICD-10-CM | POA: Diagnosis not present

## 2024-06-11 DIAGNOSIS — R809 Proteinuria, unspecified: Secondary | ICD-10-CM | POA: Diagnosis not present

## 2024-06-11 DIAGNOSIS — E211 Secondary hyperparathyroidism, not elsewhere classified: Secondary | ICD-10-CM | POA: Diagnosis not present

## 2024-06-12 ENCOUNTER — Other Ambulatory Visit (HOSPITAL_COMMUNITY): Payer: Self-pay | Admitting: Nephrology

## 2024-06-12 DIAGNOSIS — N179 Acute kidney failure, unspecified: Secondary | ICD-10-CM | POA: Diagnosis not present

## 2024-06-12 DIAGNOSIS — E211 Secondary hyperparathyroidism, not elsewhere classified: Secondary | ICD-10-CM | POA: Diagnosis not present

## 2024-06-12 DIAGNOSIS — E611 Iron deficiency: Secondary | ICD-10-CM | POA: Diagnosis not present

## 2024-06-12 DIAGNOSIS — I5032 Chronic diastolic (congestive) heart failure: Secondary | ICD-10-CM | POA: Diagnosis not present

## 2024-06-12 DIAGNOSIS — I129 Hypertensive chronic kidney disease with stage 1 through stage 4 chronic kidney disease, or unspecified chronic kidney disease: Secondary | ICD-10-CM

## 2024-06-12 DIAGNOSIS — R809 Proteinuria, unspecified: Secondary | ICD-10-CM | POA: Diagnosis not present

## 2024-06-12 DIAGNOSIS — N184 Chronic kidney disease, stage 4 (severe): Secondary | ICD-10-CM | POA: Diagnosis not present

## 2024-06-12 DIAGNOSIS — D631 Anemia in chronic kidney disease: Secondary | ICD-10-CM | POA: Diagnosis not present

## 2024-06-19 ENCOUNTER — Ambulatory Visit (HOSPITAL_COMMUNITY)
Admission: RE | Admit: 2024-06-19 | Discharge: 2024-06-19 | Disposition: A | Discharge status: Home / Self Care | Source: Ambulatory Visit | Attending: Nephrology | Admitting: Nephrology

## 2024-06-19 ENCOUNTER — Ambulatory Visit

## 2024-06-19 DIAGNOSIS — N184 Chronic kidney disease, stage 4 (severe): Secondary | ICD-10-CM | POA: Insufficient documentation

## 2024-06-19 DIAGNOSIS — I129 Hypertensive chronic kidney disease with stage 1 through stage 4 chronic kidney disease, or unspecified chronic kidney disease: Secondary | ICD-10-CM | POA: Insufficient documentation

## 2024-06-19 DIAGNOSIS — N281 Cyst of kidney, acquired: Secondary | ICD-10-CM | POA: Diagnosis not present

## 2024-07-25 ENCOUNTER — Other Ambulatory Visit (HOSPITAL_COMMUNITY)
Admission: RE | Admit: 2024-07-25 | Discharge: 2024-07-25 | Disposition: A | Discharge status: Home / Self Care | Source: Ambulatory Visit | Attending: Nephrology | Admitting: Nephrology

## 2024-07-25 DIAGNOSIS — N189 Chronic kidney disease, unspecified: Secondary | ICD-10-CM | POA: Diagnosis present

## 2024-07-25 DIAGNOSIS — D631 Anemia in chronic kidney disease: Secondary | ICD-10-CM | POA: Diagnosis not present

## 2024-07-25 DIAGNOSIS — E1122 Type 2 diabetes mellitus with diabetic chronic kidney disease: Secondary | ICD-10-CM | POA: Diagnosis not present

## 2024-07-25 DIAGNOSIS — N185 Chronic kidney disease, stage 5: Secondary | ICD-10-CM | POA: Diagnosis not present

## 2024-07-25 DIAGNOSIS — N179 Acute kidney failure, unspecified: Secondary | ICD-10-CM | POA: Diagnosis not present

## 2024-07-25 LAB — RENAL FUNCTION PANEL
Albumin: 3.3 g/dL — ABNORMAL LOW (ref 3.5–5.0)
Anion gap: 13 (ref 5–15)
BUN: 35 mg/dL — ABNORMAL HIGH (ref 8–23)
CO2: 19 mmol/L — ABNORMAL LOW (ref 22–32)
Calcium: 8.7 mg/dL — ABNORMAL LOW (ref 8.9–10.3)
Chloride: 105 mmol/L (ref 98–111)
Creatinine, Ser: 3.6 mg/dL — ABNORMAL HIGH (ref 0.61–1.24)
GFR, Estimated: 16 mL/min — ABNORMAL LOW (ref >60)
Glucose, Bld: 132 mg/dL — ABNORMAL HIGH (ref 70–99)
Phosphorus: 2.7 mg/dL (ref 2.5–4.6)
Potassium: 3.6 mmol/L (ref 3.5–5.1)
Sodium: 137 mmol/L (ref 135–145)

## 2024-07-25 LAB — CBC
HCT: 33.6 % — ABNORMAL LOW (ref 39.0–52.0)
Hemoglobin: 10.7 g/dL — ABNORMAL LOW (ref 13.0–17.0)
MCH: 21.5 pg — ABNORMAL LOW (ref 26.0–34.0)
MCHC: 31.8 g/dL (ref 30.0–36.0)
MCV: 67.5 fL — ABNORMAL LOW (ref 80.0–100.0)
Platelets: 298 K/uL (ref 150–400)
RBC: 4.98 MIL/uL (ref 4.22–5.81)
RDW: 17.2 % — ABNORMAL HIGH (ref 11.5–15.5)
WBC: 10.1 K/uL (ref 4.0–10.5)
nRBC: 0 % (ref 0.0–0.2)

## 2024-07-25 LAB — FERRITIN: Ferritin: 288 ng/mL (ref 24–336)

## 2024-07-25 LAB — IRON AND TIBC
Iron: 32 ug/dL — ABNORMAL LOW (ref 45–182)
Saturation Ratios: 13 % — ABNORMAL LOW (ref 17.9–39.5)
TIBC: 248 ug/dL — ABNORMAL LOW (ref 250–450)
UIBC: 216 ug/dL

## 2024-08-19 ENCOUNTER — Encounter (INDEPENDENT_AMBULATORY_CARE_PROVIDER_SITE_OTHER): Payer: Self-pay | Admitting: *Deleted

## 2024-08-29 NOTE — Progress Notes (Unsigned)
 [facility]   [phone] [phone]   Clinic New Consult Note   Patient Care Team: Tobie Suzzane POUR, MD as PCP - General (Internal Medicine) Alvan Dorn FALCON, MD as PCP - Cardiology (Cardiology) 08/29/2024  CHIEF COMPLAINTS/PURPOSE OF CONSULTATION: Anemia   REFERRING PHYSICIAN: Rachele Gaynell RAMAN, MD   Discussed the use of AI scribe software for clinical note transcription with the patient, who gave verbal consent to proceed.  History of Present Illness      MEDICAL HISTORY:  Past Medical History:  Diagnosis Date   Atherosclerotic heart disease of native coronary artery with unspecified angina pectoris    CAD (coronary artery disease), native coronary artery    cath 01/12/2016 50% ost D1, 60% prox LCx, 100% distal RCA lesion treated with DES   Carpal tunnel syndrome, bilateral upper limbs    Chronic systolic (congestive) heart failure (HCC)    CKD (chronic kidney disease), stage III (HCC) 01/15/2016   Diabetes mellitus without complication (HCC)    Essential (primary) hypertension    Hypertension    Hypertensive chronic kidney disease with stage 1 through stage 4 chronic kidney disease, or unspecified chronic kidney disease    Ischemic cardiomyopathy    Echo 01/13/2016 EF 40-45%   Kidney disease, chronic, stage III (moderate, EGFR 30-59 ml/min) (HCC)    Obesity, unspecified    Reflux esophagitis    Rheumatoid arthritis, unspecified (HCC)    ST elevation myocardial infarction (STEMI) of inferior wall (HCC) 01/12/2016   Subsequent ST elevation (STEMI) myocardial infarction of anterior wall (HCC)    Type 2 diabetes mellitus with diabetic nephropathy (HCC)    Type 2 diabetes mellitus with unspecified complications (HCC)     SURGICAL HISTORY: Past Surgical History:  Procedure Laterality Date   APPENDECTOMY     CARDIAC CATHETERIZATION N/A 01/12/2016   Procedure: Left Heart Cath and Coronary Angiography;  Surgeon: Ozell Fell, MD;   Location: [facility] INVASIVE CV LAB;  Service: Cardiovascular;  Laterality: N/A;   CARDIAC CATHETERIZATION N/A 01/12/2016   Procedure: Coronary Stent Intervention;  Surgeon: Ozell Fell, MD;  Location: [facility] INVASIVE CV LAB;  Service: Cardiovascular;  Laterality: N/A;   CATARACT EXTRACTION W/PHACO Right 12/30/2019   Procedure: CATARACT EXTRACTION PHACO AND INTRAOCULAR LENS PLACEMENT (IOC);  Surgeon: Harrie Agent, MD;  Location: [facility];  Service: Ophthalmology;  Laterality: Right;  CDE: 4.06   CATARACT EXTRACTION W/PHACO Left 01/13/2020   Procedure: CATARACT EXTRACTION PHACO AND INTRAOCULAR LENS PLACEMENT (IOC);  Surgeon: Harrie Agent, MD;  Location: [facility];  Service: Ophthalmology;  Laterality: Left;  CDE: 6.68   CORONARY STENT PLACEMENT  01/12/16    SOCIAL HISTORY: Social History   Socioeconomic History   Marital status: Widowed    Spouse name: Not on file   Number of children: Not on file   Years of education: Not on file   Highest education level: Not on file  Occupational History   Not on file  Tobacco Use   Smoking status: Never   Smokeless tobacco: Never  Vaping Use   Vaping status: Never Used  Substance and Sexual Activity   Alcohol use: No   Drug use: No   Sexual activity: Not on file  Other Topics Concern   Not on file  Social History Narrative   Not on file   Social Drivers of Health   Financial Resource Strain: Low Risk  (03/15/2023)   Overall Financial Resource Strain (CARDIA)    Difficulty of Paying Living Expenses: Not hard  at all  Food Insecurity: No Food Insecurity (03/15/2023)   Hunger Vital Sign    Worried About Running Out of Food in the Last Year: Never true    Ran Out of Food in the Last Year: Never true  Transportation Needs: No Transportation Needs (03/15/2023)   PRAPARE - Administrator, Civil Service (Medical): No    Lack of Transportation (Non-Medical): No  Physical Activity: Sufficiently Active (03/15/2023)   Exercise Vital Sign    Days of  Exercise per Week: 7 days    Minutes of Exercise per Session: 60 min  Stress: No Stress Concern Present (03/15/2023)   [employer] of Occupational Health - Occupational Stress Questionnaire    Feeling of Stress : Not at all  Social Connections: Moderately Isolated (03/15/2023)   Social Connection and Isolation Panel    Frequency of Communication with Friends and Family: Twice a week    Frequency of Social Gatherings with Friends and Family: Twice a week    Attends Religious Services: More than 4 times per year    Active Member of [employer] or Organizations: No    Attends Banker Meetings: Never    Marital Status: Widowed  Intimate Partner Violence: Not At Risk (03/15/2023)   Humiliation, Afraid, Rape, and Kick questionnaire    Fear of Current or Ex-Partner: No    Emotionally Abused: No    Physically Abused: No    Sexually Abused: No    FAMILY HISTORY: Family History  Problem Relation Age of Onset   Ovarian cancer Mother    Diabetes Mother    Hypertension Mother    Prostate cancer Father    Heart attack Neg Hx    Stroke Neg Hx     ALLERGIES:  is allergic to losartan  potassium.  MEDICATIONS:  Current Outpatient Medications  Medication Sig Dispense Refill   amLODipine  (NORVASC ) 5 MG tablet Take 1 tablet (5 mg total) by mouth daily. 90 tablet 3   aspirin  81 MG chewable tablet Chew 1 tablet (81 mg total) by mouth daily.     atorvastatin  (LIPITOR ) 80 MG tablet Take 1 tablet (80 mg total) by mouth daily at 6 PM. 90 tablet 3   Blood Glucose Monitoring Suppl DEVI 1 each by Does not apply route in the morning, at noon, and at bedtime. May substitute to any manufacturer covered by patient's insurance. 1 each 0   famotidine (PEPCID) 40 MG tablet Take 40 mg by mouth daily.     glipiZIDE  (GLUCOTROL  XL) 5 MG 24 hr tablet TAKE 1 TABLET BY MOUTH EVERY DAY WITH BREAKFAST 90 tablet 1   hydrALAZINE  (APRESOLINE ) 50 MG tablet Take 1 tablet (50 mg total) by mouth 3 (three) times  daily. 270 tablet 2   Insulin  Pen Needle (BD PEN NEEDLE NANO 2ND GEN) 32G X 4 MM MISC 1 each by Does not apply route daily. 100 each 5   leflunomide (ARAVA) 20 MG tablet Take 20 mg by mouth daily.     metoprolol  succinate (TOPROL -XL) 50 MG 24 hr tablet Take 1 tablet (50 mg total) by mouth daily. 90 tablet 3   Multiple Vitamins-Minerals (MULTIVITAMIN ADULTS 50+ PO) Take 1 tablet by mouth daily. Men     mupirocin  ointment (BACTROBAN ) 2 % Apply 1 Application topically 2 (two) times daily. 22 g 0   nitroGLYCERIN  (NITROSTAT ) 0.4 MG SL tablet Place 1 tablet (0.4 mg total) under the tongue every 5 (five) minutes x 3 doses as needed for chest pain.  25 tablet 3   tamsulosin  (FLOMAX ) 0.4 MG CAPS capsule Take 1 capsule (0.4 mg total) by mouth daily. 90 capsule 1   traZODone  (DESYREL ) 50 MG tablet Take 1 tablet (50 mg total) by mouth at bedtime as needed for sleep. 90 tablet 2   triamcinolone  ointment (KENALOG ) 0.5 % Apply 1 Application topically 2 (two) times daily. As needed 30 g 2   vitamin B-12 (CYANOCOBALAMIN) 500 MCG tablet Take 1,000 mcg by mouth daily.      No current facility-administered medications for this visit.    REVIEW OF SYSTEMS:   Constitutional: Denies fevers, chills or abnormal night sweats Eyes: Denies blurriness of vision, double vision or watery eyes Ears, nose, mouth, throat, and face: Denies mucositis or sore throat Respiratory: Denies cough, dyspnea or wheezes Cardiovascular: Denies palpitation, chest discomfort or lower extremity swelling Gastrointestinal:  Denies nausea, heartburn or change in bowel habits Skin: Denies abnormal skin rashes Lymphatics: Denies new lymphadenopathy or easy bruising Neurological:Denies numbness, tingling or new weaknesses Behavioral/Psych: Mood is stable, no new changes  All other systems were reviewed with the patient and are negative.  PHYSICAL EXAMINATION: ECOG PERFORMANCE STATUS: {CHL ONC ECOG PS:[id number]}  There were no vitals filed  for this visit. There were no vitals filed for this visit.  GENERAL:alert, no distress and comfortable SKIN: skin color, texture, turgor are normal, no rashes or significant lesions EYES: normal, conjunctiva are pink and non-injected, sclera clear OROPHARYNX:no exudate, no erythema and lips, buccal mucosa, and tongue normal  NECK: supple, thyroid  normal size, non-tender, without nodularity LYMPH:  no palpable lymphadenopathy in the cervical, axillary or inguinal LUNGS: clear to auscultation and percussion with normal breathing effort HEART: regular rate & rhythm and no murmurs and no lower extremity edema ABDOMEN:abdomen soft, non-tender and normal bowel sounds Musculoskeletal:no cyanosis of digits and no clubbing  PSYCH: alert & oriented x 3 with fluent speech NEURO: no focal motor/sensory deficits  Physical Exam   LABORATORY DATA:  I have reviewed the data as listed    Latest Ref Rng & Units 07/25/2024   10:55 AM 01/24/2024    2:05 PM 06/27/2019    1:01 PM  CBC  WBC 4.0 - 10.5 K/uL 10.1  8.0  6.8   Hemoglobin 13.0 - 17.0 g/dL 89.2  88.1  87.4   Hematocrit 39.0 - 52.0 % 33.6  38.5  42.4   Platelets 150 - 400 K/uL 298  267  220     @cmpl @  RADIOGRAPHIC STUDIES: I have personally reviewed the radiological images as listed and agreed with the findings in the report. No results found.  ASSESSMENT & PLAN:   Assessment and Plan Assessment & Plan      No orders of the defined types were placed in this encounter.   All questions were answered. The patient knows to call the clinic with any problems, questions or concerns. I spent {CHL ONC TIME VISIT - DTPQU:[id number]} counseling the patient face to face. The total time spent in the appointment was {CHL ONC TIME VISIT - DTPQU:[id number]} including review of chart and various tests results, discussions about plan of care and coordination of care plan.     Onita Mattock, MD 08/29/2024 9:38 PM

## 2024-08-29 NOTE — H&P (View-Only) (Signed)
 [facility]   Telephone:([phone]) [phone] Fax:([phone]) [phone]   Clinic New Consult Note   Patient Care Team: Tobie Suzzane POUR, MD as PCP - General (Internal Medicine) Alvan Dorn FALCON, MD as PCP - Cardiology (Cardiology) 08/30/2024  CHIEF COMPLAINTS/PURPOSE OF CONSULTATION: Anemia   REFERRING PHYSICIAN: Rachele Gaynell RAMAN, MD   Discussed the use of AI scribe software for clinical note transcription with the patient, who gave verbal consent to proceed.  History of Present Illness Darrell PARISI Sr. is an 89 year old male with chronic kidney disease and coronary artery disease who presents for follow-up of anemia. He is accompanied by his sister, [name]. He was referred by his kidney doctor for evaluation of anemia.  Anemia was identified during a blood test, with hemoglobin levels trending down over the past five to seven years, reaching a level of 9.3 a month ago.  Last showed low serum iron and saturation, normal TIBC, and normal ferritin at 288.  There is a family history of thalassemia and sickle cell trait. His sister may also have the sickle cell trait.  He has longstanding history of hypertension and diabetes, chronic kidney disease is present with a creatinine level of 3.6, worsening over time. He has been under nephrology care for approximately three years.  Current medications include glipizide , amlodipine , atorvastatin , hydralazine , metoprolol , Jardiance, and a daily baby aspirin . Trazodone  is prescribed for sleep but is not taken regularly.     MEDICAL HISTORY:  Past Medical History:  Diagnosis Date   Atherosclerotic heart disease of native coronary artery with unspecified angina pectoris    CAD (coronary artery disease), native coronary artery    cath 01/12/2016 50% ost D1, 60% prox LCx, 100% distal RCA lesion treated with DES   Carpal tunnel syndrome, bilateral upper limbs    Chronic systolic (congestive) heart failure (HCC)    CKD (chronic kidney  disease), stage III (HCC) 01/15/2016   Diabetes mellitus without complication (HCC)    Essential (primary) hypertension    Hypertension    Hypertensive chronic kidney disease with stage 1 through stage 4 chronic kidney disease, or unspecified chronic kidney disease    Ischemic cardiomyopathy    Echo 01/13/2016 EF 40-45%   Kidney disease, chronic, stage III (moderate, EGFR 30-59 ml/min) (HCC)    Obesity, unspecified    Reflux esophagitis    Rheumatoid arthritis, unspecified (HCC)    ST elevation myocardial infarction (STEMI) of inferior wall (HCC) 01/12/2016   Subsequent ST elevation (STEMI) myocardial infarction of anterior wall (HCC)    Type 2 diabetes mellitus with diabetic nephropathy (HCC)    Type 2 diabetes mellitus with unspecified complications (HCC)     SURGICAL HISTORY: Past Surgical History:  Procedure Laterality Date   APPENDECTOMY     CARDIAC CATHETERIZATION N/A 01/12/2016   Procedure: Left Heart Cath and Coronary Angiography;  Surgeon: Ozell Fell, MD;  Location: [facility] INVASIVE CV LAB;  Service: Cardiovascular;  Laterality: N/A;   CARDIAC CATHETERIZATION N/A 01/12/2016   Procedure: Coronary Stent Intervention;  Surgeon: Ozell Fell, MD;  Location: [facility] INVASIVE CV LAB;  Service: Cardiovascular;  Laterality: N/A;   CATARACT EXTRACTION W/PHACO Right 12/30/2019   Procedure: CATARACT EXTRACTION PHACO AND INTRAOCULAR LENS PLACEMENT (IOC);  Surgeon: Harrie Agent, MD;  Location: [facility];  Service: Ophthalmology;  Laterality: Right;  CDE: 4.06   CATARACT EXTRACTION W/PHACO Left 01/13/2020   Procedure: CATARACT EXTRACTION PHACO AND INTRAOCULAR LENS PLACEMENT (IOC);  Surgeon: Harrie Agent, MD;  Location: [facility];  Service: Ophthalmology;  Laterality: Left;  CDE: 6.68   CORONARY STENT PLACEMENT  01/12/16    SOCIAL HISTORY: Social History   Socioeconomic History   Marital status: Widowed    Spouse name: Not on file   Number of children: Not on file   Years of education: Not on file    Highest education level: Not on file  Occupational History   Not on file  Tobacco Use   Smoking status: Former    Current packs/day: 0.00    Types: Cigarettes    Quit date: 08/30/2004    Years since quitting: 20.0   Smokeless tobacco: Never  Vaping Use   Vaping status: Never Used  Substance and Sexual Activity   Alcohol use: No   Drug use: No   Sexual activity: Not on file  Other Topics Concern   Not on file  Social History Narrative   Not on file   Social Drivers of Health   Financial Resource Strain: Low Risk  (03/15/2023)   Overall Financial Resource Strain (CARDIA)    Difficulty of Paying Living Expenses: Not hard at all  Food Insecurity: No Food Insecurity (03/15/2023)   Hunger Vital Sign    Worried About Running Out of Food in the Last Year: Never true    Ran Out of Food in the Last Year: Never true  Transportation Needs: No Transportation Needs (03/15/2023)   PRAPARE - Administrator, Civil Service (Medical): No    Lack of Transportation (Non-Medical): No  Physical Activity: Sufficiently Active (03/15/2023)   Exercise Vital Sign    Days of Exercise per Week: 7 days    Minutes of Exercise per Session: 60 min  Stress: No Stress Concern Present (03/15/2023)   [employer] of Occupational Health - Occupational Stress Questionnaire    Feeling of Stress : Not at all  Social Connections: Moderately Isolated (03/15/2023)   Social Connection and Isolation Panel    Frequency of Communication with Friends and Family: Twice a week    Frequency of Social Gatherings with Friends and Family: Twice a week    Attends Religious Services: More than 4 times per year    Active Member of [employer] or Organizations: No    Attends Banker Meetings: Never    Marital Status: Widowed  Intimate Partner Violence: Not At Risk (03/15/2023)   Humiliation, Afraid, Rape, and Kick questionnaire    Fear of Current or Ex-Partner: No    Emotionally Abused: No    Physically  Abused: No    Sexually Abused: No    FAMILY HISTORY: Family History  Problem Relation Age of Onset   Ovarian cancer Mother    Diabetes Mother    Hypertension Mother    Prostate cancer Father    Heart attack Neg Hx    Stroke Neg Hx     ALLERGIES:  is allergic to losartan  potassium.  MEDICATIONS:  Current Outpatient Medications  Medication Sig Dispense Refill   amLODipine  (NORVASC ) 5 MG tablet Take 1 tablet (5 mg total) by mouth daily. 90 tablet 3   aspirin  81 MG chewable tablet Chew 1 tablet (81 mg total) by mouth daily.     atorvastatin  (LIPITOR ) 80 MG tablet Take 1 tablet (80 mg total) by mouth daily at 6 PM. 90 tablet 3   Blood Glucose Monitoring Suppl DEVI 1 each by Does not apply route in the morning, at noon, and at bedtime. May substitute to any manufacturer covered by patient's insurance. 1 each 0  empagliflozin (JARDIANCE) 10 MG TABS tablet Take 1 tablet by mouth daily.     glipiZIDE  (GLUCOTROL  XL) 5 MG 24 hr tablet TAKE 1 TABLET BY MOUTH EVERY DAY WITH BREAKFAST 90 tablet 1   hydrALAZINE  (APRESOLINE ) 50 MG tablet Take 1 tablet (50 mg total) by mouth 3 (three) times daily. 270 tablet 2   Insulin  Pen Needle (BD PEN NEEDLE NANO 2ND GEN) 32G X 4 MM MISC 1 each by Does not apply route daily. 100 each 5   leflunomide (ARAVA) 20 MG tablet Take 20 mg by mouth daily.     metoprolol  succinate (TOPROL -XL) 50 MG 24 hr tablet Take 1 tablet (50 mg total) by mouth daily. 90 tablet 3   Multiple Vitamins-Minerals (MULTIVITAMIN ADULTS 50+ PO) Take 1 tablet by mouth daily. Men     mupirocin  ointment (BACTROBAN ) 2 % Apply 1 Application topically 2 (two) times daily. 22 g 0   NIFEdipine (ADALAT CC) 30 MG 24 hr tablet SMARTSIG:1 Tablet(s) By Mouth Every 12 Hours     nitroGLYCERIN  (NITROSTAT ) 0.4 MG SL tablet Place 1 tablet (0.4 mg total) under the tongue every 5 (five) minutes x 3 doses as needed for chest pain. 25 tablet 3   tamsulosin  (FLOMAX ) 0.4 MG CAPS capsule Take 1 capsule (0.4 mg total)  by mouth daily. 90 capsule 1   traZODone  (DESYREL ) 50 MG tablet Take 1 tablet (50 mg total) by mouth at bedtime as needed for sleep. 90 tablet 2   triamcinolone  ointment (KENALOG ) 0.5 % Apply 1 Application topically 2 (two) times daily. As needed 30 g 2   vitamin B-12 (CYANOCOBALAMIN) 500 MCG tablet Take 1,000 mcg by mouth daily.      No current facility-administered medications for this visit.    REVIEW OF SYSTEMS:   Constitutional: Denies fevers, chills or abnormal night sweats Eyes: Denies blurriness of vision, double vision or watery eyes Ears, nose, mouth, throat, and face: Denies mucositis or sore throat Respiratory: Denies cough, dyspnea or wheezes Cardiovascular: Denies palpitation, chest discomfort or lower extremity swelling Gastrointestinal:  Denies nausea, heartburn or change in bowel habits Skin: Denies abnormal skin rashes Lymphatics: Denies new lymphadenopathy or easy bruising Neurological:Denies numbness, tingling or new weaknesses Behavioral/Psych: Mood is stable, no new changes  All other systems were reviewed with the patient and are negative.  PHYSICAL EXAMINATION: ECOG PERFORMANCE STATUS: 1 - Symptomatic but completely ambulatory  Vitals:   08/30/24 0919 08/30/24 0924  BP: (!) 173/89 (!) 154/82  Pulse: 66   Resp: 20   Temp: (!) 97.4 F (36.3 C)   SpO2: 97%    Filed Weights   08/30/24 0919  Weight: 204 lb 1.6 oz (92.6 kg)    GENERAL:alert, no distress and comfortable SKIN: skin color, texture, turgor are normal, no rashes or significant lesions EYES: normal, conjunctiva are pink and non-injected, sclera clear OROPHARYNX:no exudate, no erythema and lips, buccal mucosa, and tongue normal  NECK: supple, thyroid  normal size, non-tender, without nodularity LYMPH:  no palpable lymphadenopathy in the cervical, axillary or inguinal LUNGS: clear to auscultation and percussion with normal breathing effort HEART: regular rate & rhythm and no murmurs and no lower  extremity edema ABDOMEN:abdomen soft, non-tender and normal bowel sounds Musculoskeletal:no cyanosis of digits and no clubbing  PSYCH: alert & oriented x 3 with fluent speech NEURO: no focal motor/sensory deficits  Physical Exam   LABORATORY DATA:  I have reviewed the data as listed    Latest Ref Rng & Units 07/25/2024   10:55  AM 01/24/2024    2:05 PM 06/27/2019    1:01 PM  CBC  WBC 4.0 - 10.5 K/uL 10.1  8.0  6.8   Hemoglobin 13.0 - 17.0 g/dL 89.2  88.1  87.4   Hematocrit 39.0 - 52.0 % 33.6  38.5  42.4   Platelets 150 - 400 K/uL 298  267  220        Latest Ref Rng & Units 07/25/2024   10:51 AM 01/24/2024    2:05 PM 12/22/2022   11:14 AM  CMP  Glucose 70 - 99 mg/dL 867  889  868   BUN 8 - 23 mg/dL 35  30  26   Creatinine 0.61 - 1.24 mg/dL 6.39  6.52  7.29   Sodium 135 - 145 mmol/L 137  136  141   Potassium 3.5 - 5.1 mmol/L 3.6  3.7  4.2   Chloride 98 - 111 mmol/L 105  105  105   CO2 22 - 32 mmol/L 19  22  19    Calcium  8.9 - 10.3 mg/dL 8.7  8.8  9.4   Total Protein 6.5 - 8.1 g/dL  7.6  6.4   Total Bilirubin 0.0 - 1.2 mg/dL  0.3  0.3   Alkaline Phos 38 - 126 U/L  94  100   AST 15 - 41 U/L  21  24   ALT 0 - 44 U/L  13  16      RADIOGRAPHIC STUDIES: I have personally reviewed the radiological images as listed and agreed with the findings in the report. No results found.  Assessment & Plan Anemia of chronic disease from CKD, rule out thalassemia Anemia likely due to chronic kidney disease, characterized by low iron levels but normal ferritin, indicating anemia of chronic disease rather than iron deficiency. Hemoglobin has been trending down over the past 5-7 years, currently at 9, which does not yet necessitate a blood transfusion. Differential diagnosis includes nutritional anemia, multiple myeloma, and myelodysplastic syndrome (MDS), but kidney disease is sufficient to explain the anemia. Thalassemia is considered but unlikely to be the main cause. - Order blood tests to check  blood counts, iron, B12, folate, and EPO levels. - Order tests to rule out multiple myeloma and hemolytic anemia. - Consider ESA if hemoglobin drops to 9 or below, with caution to avoid over-treatment due to risk of stroke and heart attack. - Schedule follow-up in 2-3 weeks to review lab results with nurse practitioner.  Chronic kidney disease stage IV Chronic kidney disease with creatinine level at 3.6, indicating worsening kidney function. The condition is chronic and contributes to anemia of chronic disease. He expressed a desire to avoid dialysis. - Advise controlling blood pressure and diabetes to prevent further deterioration of kidney function. - Advise adequate hydration and avoidance of nephrotoxic medications like ibuprofen.  Type 2 diabetes mellitus Type 2 diabetes mellitus, which requires management to prevent complications such as worsening kidney function. - Advise maintaining good glycemic control to prevent further kidney damage.  Essential hypertension Essential hypertension, which needs to be controlled to prevent further complications, including worsening kidney function. - Advise maintaining blood pressure control to prevent further kidney damage.  Coronary artery disease, status post stent Coronary artery disease with stent placement. Risk of heart attack and stroke is a consideration in the management of anemia with ESA therapy. - Monitor cardiovascular status, especially if ESA therapy is initiated, to avoid over-treatment and associated risks.  Plan - Lab reviewed, she is likely has anemia of  chronic disease from CKD.  Will obtain additional test to rule out nutritional, multiple myeloma.  Will also check hemoglobin electrophoresis. - Follow-up in 2 to 3 weeks to review lab results, and determine future follow-up and need for ESA   Orders Placed This Encounter  Procedures   CBC with Differential/Platelet    Standing Status:   Future    Expected Date:    08/30/2024    Expiration Date:   08/30/2025   Ferritin    Standing Status:   Future    Expected Date:   08/30/2024    Expiration Date:   08/30/2025   Folate RBC    Standing Status:   Future    Expected Date:   08/30/2024    Expiration Date:   08/30/2025   Vitamin B12    Standing Status:   Future    Expiration Date:   08/30/2025   Haptoglobin    Standing Status:   Future    Expected Date:   08/30/2024    Expiration Date:   08/30/2025   Multiple Myeloma Panel (SPEP&IFE w/QIG)    Standing Status:   Future    Expected Date:   08/30/2024    Expiration Date:   08/30/2025   Retic Panel    Standing Status:   Future    Expiration Date:   08/30/2025   Lactate dehydrogenase (LDH)    Standing Status:   Future    Expiration Date:   08/30/2025   Kappa/lambda light chains    Standing Status:   Future    Expected Date:   08/30/2024    Expiration Date:   08/30/2025   Methylmalonic acid, serum    Standing Status:   Future    Expected Date:   08/30/2024    Expiration Date:   08/30/2025   Erythropoietin    Standing Status:   Future    Expected Date:   08/30/2024    Expiration Date:   08/30/2025   Hgb Fractionation Cascade    Standing Status:   Future    Expected Date:   08/30/2024    Expiration Date:   08/30/2025    All questions were answered. The patient knows to call the clinic with any problems, questions or concerns. I spent 25 minutes counseling the patient face to face. The total time spent in the appointment was 30 minutes including review of chart and various tests results, discussions about plan of care and coordination of care plan.     Onita Mattock, MD 08/30/2024 10:01 AM

## 2024-08-30 ENCOUNTER — Encounter: Payer: Self-pay | Admitting: Hematology

## 2024-08-30 ENCOUNTER — Inpatient Hospital Stay

## 2024-08-30 ENCOUNTER — Inpatient Hospital Stay: Attending: Hematology | Admitting: Hematology

## 2024-08-30 VITALS — BP 154/82 | HR 66 | Temp 97.4°F | Resp 20 | Ht 68.0 in | Wt 204.1 lb

## 2024-08-30 DIAGNOSIS — I130 Hypertensive heart and chronic kidney disease with heart failure and stage 1 through stage 4 chronic kidney disease, or unspecified chronic kidney disease: Secondary | ICD-10-CM | POA: Insufficient documentation

## 2024-08-30 DIAGNOSIS — Z955 Presence of coronary angioplasty implant and graft: Secondary | ICD-10-CM | POA: Insufficient documentation

## 2024-08-30 DIAGNOSIS — D631 Anemia in chronic kidney disease: Secondary | ICD-10-CM | POA: Diagnosis not present

## 2024-08-30 DIAGNOSIS — Z87891 Personal history of nicotine dependence: Secondary | ICD-10-CM | POA: Insufficient documentation

## 2024-08-30 DIAGNOSIS — Z79899 Other long term (current) drug therapy: Secondary | ICD-10-CM | POA: Insufficient documentation

## 2024-08-30 DIAGNOSIS — Z7984 Long term (current) use of oral hypoglycemic drugs: Secondary | ICD-10-CM | POA: Diagnosis not present

## 2024-08-30 DIAGNOSIS — Z8041 Family history of malignant neoplasm of ovary: Secondary | ICD-10-CM | POA: Insufficient documentation

## 2024-08-30 DIAGNOSIS — E1122 Type 2 diabetes mellitus with diabetic chronic kidney disease: Secondary | ICD-10-CM | POA: Diagnosis not present

## 2024-08-30 DIAGNOSIS — Z7982 Long term (current) use of aspirin: Secondary | ICD-10-CM | POA: Insufficient documentation

## 2024-08-30 DIAGNOSIS — D649 Anemia, unspecified: Secondary | ICD-10-CM

## 2024-08-30 DIAGNOSIS — Z8042 Family history of malignant neoplasm of prostate: Secondary | ICD-10-CM | POA: Insufficient documentation

## 2024-08-30 DIAGNOSIS — I2510 Atherosclerotic heart disease of native coronary artery without angina pectoris: Secondary | ICD-10-CM | POA: Diagnosis not present

## 2024-08-30 DIAGNOSIS — N184 Chronic kidney disease, stage 4 (severe): Secondary | ICD-10-CM | POA: Diagnosis not present

## 2024-08-30 LAB — VITAMIN B12: Vitamin B-12: 1364 pg/mL — ABNORMAL HIGH (ref 180–914)

## 2024-08-30 LAB — CBC WITH DIFFERENTIAL/PLATELET
Abs Immature Granulocytes: 0.04 K/uL (ref 0.00–0.07)
Basophils Absolute: 0.1 K/uL (ref 0.0–0.1)
Basophils Relative: 1 %
Eosinophils Absolute: 0.5 K/uL (ref 0.0–0.5)
Eosinophils Relative: 5 %
HCT: 33.9 % — ABNORMAL LOW (ref 39.0–52.0)
Hemoglobin: 10.5 g/dL — ABNORMAL LOW (ref 13.0–17.0)
Immature Granulocytes: 0 %
Lymphocytes Relative: 15 %
Lymphs Abs: 1.3 K/uL (ref 0.7–4.0)
MCH: 21.4 pg — ABNORMAL LOW (ref 26.0–34.0)
MCHC: 31 g/dL (ref 30.0–36.0)
MCV: 69.2 fL — ABNORMAL LOW (ref 80.0–100.0)
Monocytes Absolute: 0.9 K/uL (ref 0.1–1.0)
Monocytes Relative: 10 %
Neutro Abs: 6.1 K/uL (ref 1.7–7.7)
Neutrophils Relative %: 69 %
Platelets: 316 K/uL (ref 150–400)
RBC: 4.9 MIL/uL (ref 4.22–5.81)
RDW: 18.2 % — ABNORMAL HIGH (ref 11.5–15.5)
Smear Review: NORMAL
WBC: 8.9 K/uL (ref 4.0–10.5)
nRBC: 0 % (ref 0.0–0.2)

## 2024-08-30 LAB — LACTATE DEHYDROGENASE: LDH: 156 U/L (ref 98–192)

## 2024-08-30 LAB — RETIC PANEL
Immature Retic Fract: 17.1 % — ABNORMAL HIGH (ref 2.3–15.9)
RBC.: 4.87 MIL/uL (ref 4.22–5.81)
Retic Count, Absolute: 62.3 K/uL (ref 19.0–186.0)
Retic Ct Pct: 1.3 % (ref 0.4–3.1)
Reticulocyte Hemoglobin: 24 pg — ABNORMAL LOW (ref >27.9)

## 2024-08-30 LAB — FERRITIN: Ferritin: 445 ng/mL — ABNORMAL HIGH (ref 24–336)

## 2024-08-31 LAB — ERYTHROPOIETIN: Erythropoietin: 9.4 m[IU]/mL (ref 2.6–18.5)

## 2024-08-31 LAB — HAPTOGLOBIN: Haptoglobin: 249 mg/dL (ref 38–329)

## 2024-09-01 LAB — HGB FRACTIONATION CASCADE
Hgb A2: 2.4 % (ref 1.8–3.2)
Hgb A: 97.6 % (ref 96.4–98.8)
Hgb F: 0 % (ref 0.0–2.0)
Hgb S: 0 %

## 2024-09-02 ENCOUNTER — Telehealth (INDEPENDENT_AMBULATORY_CARE_PROVIDER_SITE_OTHER): Payer: Self-pay

## 2024-09-02 ENCOUNTER — Ambulatory Visit (INDEPENDENT_AMBULATORY_CARE_PROVIDER_SITE_OTHER): Admitting: Gastroenterology

## 2024-09-02 ENCOUNTER — Encounter (INDEPENDENT_AMBULATORY_CARE_PROVIDER_SITE_OTHER): Payer: Self-pay | Admitting: Gastroenterology

## 2024-09-02 VITALS — BP 167/92 | HR 81 | Temp 97.3°F | Ht 68.5 in | Wt 205.9 lb

## 2024-09-02 DIAGNOSIS — D509 Iron deficiency anemia, unspecified: Secondary | ICD-10-CM | POA: Diagnosis not present

## 2024-09-02 DIAGNOSIS — D649 Anemia, unspecified: Secondary | ICD-10-CM

## 2024-09-02 LAB — FOLATE RBC
Folate, Hemolysate: 336 ng/mL
Folate, RBC: 926 ng/mL (ref >498)
Hematocrit: 36.3 % — ABNORMAL LOW (ref 37.5–51.0)

## 2024-09-02 LAB — KAPPA/LAMBDA LIGHT CHAINS
Kappa free light chain: 121.7 mg/L — ABNORMAL HIGH (ref 3.3–19.4)
Kappa, lambda light chain ratio: 2.14 — ABNORMAL HIGH (ref 0.26–1.65)
Lambda free light chains: 56.9 mg/L — ABNORMAL HIGH (ref 5.7–26.3)

## 2024-09-02 NOTE — Patient Instructions (Addendum)
 Schedule EGD and colonoscopy Continue oral iron daily Continue follow up with hematology

## 2024-09-02 NOTE — Progress Notes (Signed)
 [name], M.D. Gastroenterology & Hepatology [facility] Gastroenterology [address] [place], [place] [address] Primary Care Physician: Tobie Suzzane POUR, MD [address] [place] [address]  Referring MD: Annabella Nurse NP Chief Complaint: Iron deficiency anemia  History of Present Illness: VEDH PTACEK Sr. is a 89 y.o. male with past medical history coronary artery disease status post stent placement, CKD, diabetes, hypertension, rheumatoid arthritis, GERD, obesity, who presents for evaluation of iron deficiency anemia.  The patient denies any complaints and reports feeling well.  Patient denies having any complaints. The patient denies having any nausea, vomiting, fever, chills, hematochezia, melena, hematemesis, abdominal distention, abdominal pain, diarrhea, jaundice, pruritus or weight loss.  Saw hematology on 10/10 ([name]).  It was considered that the most likely reason for his anemia was due to CKD, although other etiologies will be ruled out with serologies, less likely related to thalassemia.  Patient has had fluctuation in his hemoglobin at least since 2018 as his hemoglobin has been between 11-12 but in September his value dropped down to 10.7.  Most recent hemoglobin was 10.5 on 08/30/2024, platelets were 316 and white blood cell count was 8.9.  Most recent iron stores from 07/25/2024 showed iron of 32, saturation 13%, TIBC 248, ferritin 288  Has been taking oral iron for at least a year on a daily basis.  Last EGD: never Last Colonoscopy: Believes he had this less than 20 years ago  FHx: neg for any gastrointestinal/liver disease, father prostate cancer, sister NHL, mother ocarian cancer, brother prostate cancer Social: quit smoking 1975 , quit alcohol 4-5 years, neg  illicit drug use Surgical: no abdominal surgeries   Past Medical History: Past Medical History:  Diagnosis Date   Atherosclerotic heart disease of native coronary  artery with unspecified angina pectoris    CAD (coronary artery disease), native coronary artery    cath 01/12/2016 50% ost D1, 60% prox LCx, 100% distal RCA lesion treated with DES   Carpal tunnel syndrome, bilateral upper limbs    Chronic systolic (congestive) heart failure (HCC)    CKD (chronic kidney disease), stage III (HCC) 01/15/2016   Diabetes mellitus without complication (HCC)    Essential (primary) hypertension    Hypertension    Hypertensive chronic kidney disease with stage 1 through stage 4 chronic kidney disease, or unspecified chronic kidney disease    Ischemic cardiomyopathy    Echo 01/13/2016 EF 40-45%   Kidney disease, chronic, stage III (moderate, EGFR 30-59 ml/min) (HCC)    Obesity, unspecified    Reflux esophagitis    Rheumatoid arthritis, unspecified (HCC)    ST elevation myocardial infarction (STEMI) of inferior wall (HCC) 01/12/2016   Subsequent ST elevation (STEMI) myocardial infarction of anterior wall (HCC)    Type 2 diabetes mellitus with diabetic nephropathy (HCC)    Type 2 diabetes mellitus with unspecified complications (HCC)     Past Surgical History: Past Surgical History:  Procedure Laterality Date   APPENDECTOMY     CARDIAC CATHETERIZATION N/A 01/12/2016   Procedure: Left Heart Cath and Coronary Angiography;  Surgeon: Ozell Fell, MD;  Location: [facility] INVASIVE CV LAB;  Service: Cardiovascular;  Laterality: N/A;   CARDIAC CATHETERIZATION N/A 01/12/2016   Procedure: Coronary Stent Intervention;  Surgeon: Ozell Fell, MD;  Location: [facility] INVASIVE CV LAB;  Service: Cardiovascular;  Laterality: N/A;   CATARACT EXTRACTION W/PHACO Right 12/30/2019   Procedure: CATARACT EXTRACTION PHACO AND INTRAOCULAR LENS PLACEMENT (IOC);  Surgeon: Harrie Agent, MD;  Location:  [facility];  Service: Ophthalmology;  Laterality: Right;  CDE: 4.06   CATARACT EXTRACTION W/PHACO Left 01/13/2020   Procedure: CATARACT EXTRACTION PHACO AND INTRAOCULAR LENS PLACEMENT (IOC);  Surgeon:  Harrie Agent, MD;  Location: [facility];  Service: Ophthalmology;  Laterality: Left;  CDE: 6.68   CORONARY STENT PLACEMENT  01/12/16    Family History: Family History  Problem Relation Age of Onset   Ovarian cancer Mother    Diabetes Mother    Hypertension Mother    Prostate cancer Father    Heart attack Neg Hx    Stroke Neg Hx     Social History: Social History   Tobacco Use  Smoking Status Former   Current packs/day: 0.00   Types: Cigarettes   Quit date: 08/30/2004   Years since quitting: 20.0  Smokeless Tobacco Never   Social History   Substance and Sexual Activity  Alcohol Use No   Social History   Substance and Sexual Activity  Drug Use No    Allergies: Allergies  Allergen Reactions   Losartan  Potassium Other (See Comments)    Medications: Current Outpatient Medications  Medication Sig Dispense Refill   amLODipine  (NORVASC ) 5 MG tablet Take 1 tablet (5 mg total) by mouth daily. 90 tablet 3   aspirin  81 MG chewable tablet Chew 1 tablet (81 mg total) by mouth daily.     atorvastatin  (LIPITOR ) 80 MG tablet Take 1 tablet (80 mg total) by mouth daily at 6 PM. 90 tablet 3   Blood Glucose Monitoring Suppl DEVI 1 each by Does not apply route in the morning, at noon, and at bedtime. May substitute to any manufacturer covered by patient's insurance. 1 each 0   empagliflozin (JARDIANCE) 10 MG TABS tablet Take 1 tablet by mouth daily.     glipiZIDE  (GLUCOTROL  XL) 5 MG 24 hr tablet TAKE 1 TABLET BY MOUTH EVERY DAY WITH BREAKFAST 90 tablet 1   hydrALAZINE  (APRESOLINE ) 50 MG tablet Take 1 tablet (50 mg total) by mouth 3 (three) times daily. 270 tablet 2   Insulin  Pen Needle (BD PEN NEEDLE NANO 2ND GEN) 32G X 4 MM MISC 1 each by Does not apply route daily. 100 each 5   leflunomide (ARAVA) 20 MG tablet Take 20 mg by mouth daily.     metoprolol  succinate (TOPROL -XL) 50 MG 24 hr tablet Take 1 tablet (50 mg total) by mouth daily. 90 tablet 3   Multiple Vitamins-Minerals  (MULTIVITAMIN ADULTS 50+ PO) Take 1 tablet by mouth daily. Men     mupirocin  ointment (BACTROBAN ) 2 % Apply 1 Application topically 2 (two) times daily. 22 g 0   NIFEdipine (ADALAT CC) 30 MG 24 hr tablet SMARTSIG:1 Tablet(s) By Mouth Every 12 Hours     nitroGLYCERIN  (NITROSTAT ) 0.4 MG SL tablet Place 1 tablet (0.4 mg total) under the tongue every 5 (five) minutes x 3 doses as needed for chest pain. 25 tablet 3   tamsulosin  (FLOMAX ) 0.4 MG CAPS capsule Take 1 capsule (0.4 mg total) by mouth daily. 90 capsule 1   traZODone  (DESYREL ) 50 MG tablet Take 1 tablet (50 mg total) by mouth at bedtime as needed for sleep. 90 tablet 2   triamcinolone  ointment (KENALOG ) 0.5 % Apply 1 Application topically 2 (two) times daily. As needed 30 g 2   vitamin B-12 (CYANOCOBALAMIN) 500 MCG tablet Take 1,000 mcg by mouth daily.      No current facility-administered medications for this visit.    Review of Systems: GENERAL: negative for  malaise, night sweats HEENT: No changes in hearing or vision, no nose bleeds or other nasal problems. NECK: Negative for lumps, goiter, pain and significant neck swelling RESPIRATORY: Negative for cough, wheezing CARDIOVASCULAR: Negative for chest pain, leg swelling, palpitations, orthopnea GI: SEE HPI MUSCULOSKELETAL: Negative for joint pain or swelling, back pain, and muscle pain. SKIN: Negative for lesions, rash PSYCH: Negative for sleep disturbance, mood disorder and recent psychosocial stressors. HEMATOLOGY Negative for prolonged bleeding, bruising easily, and swollen nodes. ENDOCRINE: Negative for cold or heat intolerance, polyuria, polydipsia and goiter. NEURO: negative for tremor, gait imbalance, syncope and seizures. The remainder of the review of systems is noncontributory.   Physical Exam: BP (!) 167/92 (BP Location: Left Arm, Patient Position: Sitting, Cuff Size: Large)   Pulse 81   Temp (!) 97.3 F (36.3 C) (Temporal)   Ht 5' 8.5 (1.74 m)   Wt 205 lb 14.4 oz  (93.4 kg)   BMI 30.85 kg/m  GENERAL: The patient is AO x3, in no acute distress. HEENT: Head is normocephalic and atraumatic. EOMI are intact. Mouth is well hydrated and without lesions. NECK: Supple. No masses LUNGS: Clear to auscultation. No presence of rhonchi/wheezing/rales. Adequate chest expansion HEART: RRR, normal s1 and s2. ABDOMEN: Soft, nontender, no guarding, no peritoneal signs, and nondistended. BS +. No masses. EXTREMITIES: Without any cyanosis, clubbing, rash, lesions or edema. NEUROLOGIC: AOx3, no focal motor deficit. SKIN: no jaundice, no rashes   Imaging/Labs: as above  I personally reviewed and interpreted the available labs, imaging and endoscopic files.  Impression and Plan: WILMAR PRABHAKAR Sr. is a 89 y.o. male with past medical history coronary artery disease status post stent placement, CKD, diabetes, hypertension, rheumatoid arthritis, GERD, obesity, who presents for evaluation of iron deficiency anemia.  Patient was found to have mild drop in his hemoglobin with evidence of mild iron deficiency.  Has not presented any overt gastrointestinal bleeding episodes and reports feeling well.  Has been tolerating daily iron without any complaints.  We discussed in detail that ideally we should evaluate his iron deficiency with bidirectional endoscopy, which he is agreeable to proceed with to rule out etiologies of chronic gastrointestinal losses of blood.  His anemia is likely multifactorial and I encouraged him to continue following closely with hematology.  -Schedule EGD and colonoscopy -Continue oral iron daily -Continue follow up with hematology  All questions were answered.      [name], MD Gastroenterology and Hepatology [facility] Gastroenterology

## 2024-09-02 NOTE — Telephone Encounter (Signed)
 PA on [facility] for EGD/TCS:  Notification or Prior Authorization is not required for the requested services You are not required to submit a notification/prior authorization based on the information provided.  Decision ID #: [id number]

## 2024-09-02 NOTE — Telephone Encounter (Signed)
 ATC pt to schedule, no answer. LVM for call back.

## 2024-09-02 NOTE — H&P (View-Only) (Signed)
 Darrell Marshall, M.D. Gastroenterology & Hepatology [facility] Gastroenterology [address] [place], [place] [address] Primary Care Physician: Darrell Suzzane POUR, MD [address] [place] [address]  Referring MD: Darrell Marshall Nurse NP Chief Complaint: Iron deficiency anemia  History of Present Illness: Darrell PTACEK Sr. is a 89 y.o. male with past medical history coronary artery disease status post stent placement, CKD, diabetes, hypertension, rheumatoid arthritis, GERD, obesity, who presents for evaluation of iron deficiency anemia.  The patient denies any complaints and reports feeling well.  Patient denies having any complaints. The patient denies having any nausea, vomiting, fever, chills, hematochezia, melena, hematemesis, abdominal distention, abdominal pain, diarrhea, jaundice, pruritus or weight loss.  Saw hematology on 10/10 ([name]).  It was considered that the most likely reason for his anemia was due to CKD, although other etiologies will be ruled out with serologies, less likely related to thalassemia.  Patient has had fluctuation in his hemoglobin at least since 2018 as his hemoglobin has been between 11-12 but in September his value dropped down to 10.7.  Most recent hemoglobin was 10.5 on 08/30/2024, platelets were 316 and white blood cell count was 8.9.  Most recent iron stores from 07/25/2024 showed iron of 32, saturation 13%, TIBC 248, ferritin 288  Has been taking oral iron for at least a year on a daily basis.  Last EGD: never Last Colonoscopy: Believes he had this less than 20 years ago  FHx: neg for any gastrointestinal/liver disease, father prostate cancer, sister NHL, mother ocarian cancer, brother prostate cancer Social: quit smoking 1975 , quit alcohol 4-5 years, neg  illicit drug use Surgical: no abdominal surgeries   Past Medical History: Past Medical History:  Diagnosis Date   Atherosclerotic heart disease of native coronary  artery with unspecified angina pectoris    CAD (coronary artery disease), native coronary artery    cath 01/12/2016 50% ost D1, 60% prox LCx, 100% distal RCA lesion treated with DES   Carpal tunnel syndrome, bilateral upper limbs    Chronic systolic (congestive) heart failure (HCC)    CKD (chronic kidney disease), stage III (HCC) 01/15/2016   Diabetes mellitus without complication (HCC)    Essential (primary) hypertension    Hypertension    Hypertensive chronic kidney disease with stage 1 through stage 4 chronic kidney disease, or unspecified chronic kidney disease    Ischemic cardiomyopathy    Echo 01/13/2016 EF 40-45%   Kidney disease, chronic, stage III (moderate, EGFR 30-59 ml/min) (HCC)    Obesity, unspecified    Reflux esophagitis    Rheumatoid arthritis, unspecified (HCC)    ST elevation myocardial infarction (STEMI) of inferior wall (HCC) 01/12/2016   Subsequent ST elevation (STEMI) myocardial infarction of anterior wall (HCC)    Type 2 diabetes mellitus with diabetic nephropathy (HCC)    Type 2 diabetes mellitus with unspecified complications (HCC)     Past Surgical History: Past Surgical History:  Procedure Laterality Date   APPENDECTOMY     CARDIAC CATHETERIZATION N/A 01/12/2016   Procedure: Left Heart Cath and Coronary Angiography;  Surgeon: Ozell Fell, MD;  Location: [facility] INVASIVE CV LAB;  Service: Cardiovascular;  Laterality: N/A;   CARDIAC CATHETERIZATION N/A 01/12/2016   Procedure: Coronary Stent Intervention;  Surgeon: Ozell Fell, MD;  Location: [facility] INVASIVE CV LAB;  Service: Cardiovascular;  Laterality: N/A;   CATARACT EXTRACTION W/PHACO Right 12/30/2019   Procedure: CATARACT EXTRACTION PHACO AND INTRAOCULAR LENS PLACEMENT (IOC);  Surgeon: Harrie Agent, MD;  Location:  [facility];  Service: Ophthalmology;  Laterality: Right;  CDE: 4.06   CATARACT EXTRACTION W/PHACO Left 01/13/2020   Procedure: CATARACT EXTRACTION PHACO AND INTRAOCULAR LENS PLACEMENT (IOC);  Surgeon:  Harrie Agent, MD;  Location: [facility];  Service: Ophthalmology;  Laterality: Left;  CDE: 6.68   CORONARY STENT PLACEMENT  01/12/16    Family History: Family History  Problem Relation Age of Onset   Ovarian cancer Mother    Diabetes Mother    Hypertension Mother    Prostate cancer Father    Heart attack Neg Hx    Stroke Neg Hx     Social History: Social History   Tobacco Use  Smoking Status Former   Current packs/day: 0.00   Types: Cigarettes   Quit date: 08/30/2004   Years since quitting: 20.0  Smokeless Tobacco Never   Social History   Substance and Sexual Activity  Alcohol Use No   Social History   Substance and Sexual Activity  Drug Use No    Allergies: Allergies  Allergen Reactions   Losartan  Potassium Other (See Comments)    Medications: Current Outpatient Medications  Medication Sig Dispense Refill   amLODipine  (NORVASC ) 5 MG tablet Take 1 tablet (5 mg total) by mouth daily. 90 tablet 3   aspirin  81 MG chewable tablet Chew 1 tablet (81 mg total) by mouth daily.     atorvastatin  (LIPITOR ) 80 MG tablet Take 1 tablet (80 mg total) by mouth daily at 6 PM. 90 tablet 3   Blood Glucose Monitoring Suppl DEVI 1 each by Does not apply route in the morning, at noon, and at bedtime. May substitute to any manufacturer covered by patient's insurance. 1 each 0   empagliflozin (JARDIANCE) 10 MG TABS tablet Take 1 tablet by mouth daily.     glipiZIDE  (GLUCOTROL  XL) 5 MG 24 hr tablet TAKE 1 TABLET BY MOUTH EVERY DAY WITH BREAKFAST 90 tablet 1   hydrALAZINE  (APRESOLINE ) 50 MG tablet Take 1 tablet (50 mg total) by mouth 3 (three) times daily. 270 tablet 2   Insulin  Pen Needle (BD PEN NEEDLE NANO 2ND GEN) 32G X 4 MM MISC 1 each by Does not apply route daily. 100 each 5   leflunomide (ARAVA) 20 MG tablet Take 20 mg by mouth daily.     metoprolol  succinate (TOPROL -XL) 50 MG 24 hr tablet Take 1 tablet (50 mg total) by mouth daily. 90 tablet 3   Multiple Vitamins-Minerals  (MULTIVITAMIN ADULTS 50+ PO) Take 1 tablet by mouth daily. Men     mupirocin  ointment (BACTROBAN ) 2 % Apply 1 Application topically 2 (two) times daily. 22 g 0   NIFEdipine (ADALAT CC) 30 MG 24 hr tablet SMARTSIG:1 Tablet(s) By Mouth Every 12 Hours     nitroGLYCERIN  (NITROSTAT ) 0.4 MG SL tablet Place 1 tablet (0.4 mg total) under the tongue every 5 (five) minutes x 3 doses as needed for chest pain. 25 tablet 3   tamsulosin  (FLOMAX ) 0.4 MG CAPS capsule Take 1 capsule (0.4 mg total) by mouth daily. 90 capsule 1   traZODone  (DESYREL ) 50 MG tablet Take 1 tablet (50 mg total) by mouth at bedtime as needed for sleep. 90 tablet 2   triamcinolone  ointment (KENALOG ) 0.5 % Apply 1 Application topically 2 (two) times daily. As needed 30 g 2   vitamin B-12 (CYANOCOBALAMIN) 500 MCG tablet Take 1,000 mcg by mouth daily.      No current facility-administered medications for this visit.    Review of Systems: GENERAL: negative for  malaise, night sweats HEENT: No changes in hearing or vision, no nose bleeds or other nasal problems. NECK: Negative for lumps, goiter, pain and significant neck swelling RESPIRATORY: Negative for cough, wheezing CARDIOVASCULAR: Negative for chest pain, leg swelling, palpitations, orthopnea GI: SEE HPI MUSCULOSKELETAL: Negative for joint pain or swelling, back pain, and muscle pain. SKIN: Negative for lesions, rash PSYCH: Negative for sleep disturbance, mood disorder and recent psychosocial stressors. HEMATOLOGY Negative for prolonged bleeding, bruising easily, and swollen nodes. ENDOCRINE: Negative for cold or heat intolerance, polyuria, polydipsia and goiter. NEURO: negative for tremor, gait imbalance, syncope and seizures. The remainder of the review of systems is noncontributory.   Physical Exam: BP (!) 167/92 (BP Location: Left Arm, Patient Position: Sitting, Cuff Size: Large)   Pulse 81   Temp (!) 97.3 F (36.3 C) (Temporal)   Ht 5' 8.5 (1.74 m)   Wt 205 lb 14.4 oz  (93.4 kg)   BMI 30.85 kg/m  GENERAL: The patient is AO x3, in no acute distress. HEENT: Head is normocephalic and atraumatic. EOMI are intact. Mouth is well hydrated and without lesions. NECK: Supple. No masses LUNGS: Clear to auscultation. No presence of rhonchi/wheezing/rales. Adequate chest expansion HEART: RRR, normal s1 and s2. ABDOMEN: Soft, nontender, no guarding, no peritoneal signs, and nondistended. BS +. No masses. EXTREMITIES: Without any cyanosis, clubbing, rash, lesions or edema. NEUROLOGIC: AOx3, no focal motor deficit. SKIN: no jaundice, no rashes   Imaging/Labs: as above  I personally reviewed and interpreted the available labs, imaging and endoscopic files.  Impression and Plan: Darrell PRABHAKAR Sr. is a 89 y.o. male with past medical history coronary artery disease status post stent placement, CKD, diabetes, hypertension, rheumatoid arthritis, GERD, obesity, who presents for evaluation of iron deficiency anemia.  Patient was found to have mild drop in his hemoglobin with evidence of mild iron deficiency.  Has not presented any overt gastrointestinal bleeding episodes and reports feeling well.  Has been tolerating daily iron without any complaints.  We discussed in detail that ideally we should evaluate his iron deficiency with bidirectional endoscopy, which he is agreeable to proceed with to rule out etiologies of chronic gastrointestinal losses of blood.  His anemia is likely multifactorial and I encouraged him to continue following closely with hematology.  -Schedule EGD and colonoscopy -Continue oral iron daily -Continue follow up with hematology  All questions were answered.      Darrell Fortune, MD Gastroenterology and Hepatology [facility] Gastroenterology

## 2024-09-02 NOTE — Progress Notes (Signed)
 SABRA

## 2024-09-03 MED ORDER — PEG 3350-KCL-NA BICARB-NACL 420 G PO SOLR: 4000.0000 mL | Freq: Once | ORAL | 0 refills | Status: AC

## 2024-09-03 NOTE — Telephone Encounter (Signed)
 PA on [facility] for EGD/TCS Notification or Prior Authorization is not required for the requested services You are not required to submit a notification/prior authorization based on the information provided.  Decision ID #: [id number]

## 2024-09-03 NOTE — Telephone Encounter (Signed)
 Received a vm from patient's sister, returned call to schedule pt. No answer, LVM for call back.

## 2024-09-03 NOTE — Addendum Note (Signed)
 Addended by: DALLIE LIONEL RAMAN on: 09/03/2024 03:42 PM   Modules accepted: Orders

## 2024-09-03 NOTE — Telephone Encounter (Signed)
 Spoke with patient's sister on [place], scheduled TCS/EGD for 09/10/2024. Rx sent to pharmacy. Patient is coming to pick up instructions from the office.

## 2024-09-04 LAB — MULTIPLE MYELOMA PANEL, SERUM
Albumin SerPl Elph-Mcnc: 3.3 g/dL (ref 2.9–4.4)
Albumin/Glob SerPl: 1 (ref 0.7–1.7)
Alpha 1: 0.3 g/dL (ref 0.0–0.4)
Alpha2 Glob SerPl Elph-Mcnc: 0.9 g/dL (ref 0.4–1.0)
B-Globulin SerPl Elph-Mcnc: 1 g/dL (ref 0.7–1.3)
Gamma Glob SerPl Elph-Mcnc: 1.3 g/dL (ref 0.4–1.8)
Globulin, Total: 3.4 g/dL (ref 2.2–3.9)
IgA: 379 mg/dL (ref 61–437)
IgG (Immunoglobin G), Serum: 1361 mg/dL (ref 603–1613)
IgM (Immunoglobulin M), Srm: 99 mg/dL (ref 15–143)
Total Protein ELP: 6.7 g/dL (ref 6.0–8.5)

## 2024-09-04 NOTE — Telephone Encounter (Signed)
 Spoke with patients sister (on [place]) and gave her pre-op details.

## 2024-09-06 ENCOUNTER — Encounter (HOSPITAL_COMMUNITY)
Admission: RE | Admit: 2024-09-06 | Discharge: 2024-09-06 | Disposition: A | Discharge status: Home / Self Care | Source: Ambulatory Visit | Attending: Gastroenterology | Admitting: Gastroenterology

## 2024-09-07 LAB — METHYLMALONIC ACID, SERUM: Methylmalonic Acid, Quantitative: 225 nmol/L (ref 0–378)

## 2024-09-09 MED ORDER — PEG 3350-KCL-NA BICARB-NACL 420 G PO SOLR: 4000.0000 mL | Freq: Once | ORAL | 0 refills | Status: AC

## 2024-09-09 NOTE — Addendum Note (Signed)
 Addended by: DALLIE LIONEL RAMAN on: 09/09/2024 01:25 PM   Modules accepted: Orders

## 2024-09-09 NOTE — Telephone Encounter (Signed)
 Patient's sister left me a vm stating that the patient starting drinking his prep this morning when he was supposed to start this afternoon. I spoke with Dr. Eartha here in the office and he stated for me to send in another prep and have the patient complete this prep as instructed. Rx has been sent to pharmacy. Patients sister is aware and verbalized understanding.

## 2024-09-10 ENCOUNTER — Encounter (HOSPITAL_COMMUNITY): Payer: Self-pay | Admitting: Gastroenterology

## 2024-09-10 ENCOUNTER — Ambulatory Visit (HOSPITAL_COMMUNITY)
Admission: RE | Admit: 2024-09-10 | Discharge: 2024-09-10 | Disposition: A | Discharge status: Home / Self Care | Attending: Gastroenterology | Admitting: Gastroenterology

## 2024-09-10 ENCOUNTER — Other Ambulatory Visit: Payer: Self-pay

## 2024-09-10 ENCOUNTER — Encounter (HOSPITAL_COMMUNITY)
Admission: RE | Disposition: A | Discharge status: Home / Self Care | Payer: Self-pay | Source: Home / Self Care | Attending: Gastroenterology

## 2024-09-10 ENCOUNTER — Ambulatory Visit (HOSPITAL_COMMUNITY): Admitting: Certified Registered"

## 2024-09-10 DIAGNOSIS — K635 Polyp of colon: Secondary | ICD-10-CM

## 2024-09-10 DIAGNOSIS — Z8042 Family history of malignant neoplasm of prostate: Secondary | ICD-10-CM | POA: Insufficient documentation

## 2024-09-10 DIAGNOSIS — I10 Essential (primary) hypertension: Secondary | ICD-10-CM | POA: Diagnosis not present

## 2024-09-10 DIAGNOSIS — I2510 Atherosclerotic heart disease of native coronary artery without angina pectoris: Secondary | ICD-10-CM | POA: Insufficient documentation

## 2024-09-10 DIAGNOSIS — D125 Benign neoplasm of sigmoid colon: Secondary | ICD-10-CM | POA: Insufficient documentation

## 2024-09-10 DIAGNOSIS — M069 Rheumatoid arthritis, unspecified: Secondary | ICD-10-CM | POA: Diagnosis not present

## 2024-09-10 DIAGNOSIS — D631 Anemia in chronic kidney disease: Secondary | ICD-10-CM | POA: Diagnosis not present

## 2024-09-10 DIAGNOSIS — K648 Other hemorrhoids: Secondary | ICD-10-CM

## 2024-09-10 DIAGNOSIS — Z7984 Long term (current) use of oral hypoglycemic drugs: Secondary | ICD-10-CM | POA: Insufficient documentation

## 2024-09-10 DIAGNOSIS — Z87891 Personal history of nicotine dependence: Secondary | ICD-10-CM | POA: Insufficient documentation

## 2024-09-10 DIAGNOSIS — I252 Old myocardial infarction: Secondary | ICD-10-CM | POA: Diagnosis not present

## 2024-09-10 DIAGNOSIS — D122 Benign neoplasm of ascending colon: Secondary | ICD-10-CM

## 2024-09-10 DIAGNOSIS — D123 Benign neoplasm of transverse colon: Secondary | ICD-10-CM | POA: Diagnosis not present

## 2024-09-10 DIAGNOSIS — E1122 Type 2 diabetes mellitus with diabetic chronic kidney disease: Secondary | ICD-10-CM | POA: Insufficient documentation

## 2024-09-10 DIAGNOSIS — K3189 Other diseases of stomach and duodenum: Secondary | ICD-10-CM

## 2024-09-10 DIAGNOSIS — I255 Ischemic cardiomyopathy: Secondary | ICD-10-CM | POA: Insufficient documentation

## 2024-09-10 DIAGNOSIS — I130 Hypertensive heart and chronic kidney disease with heart failure and stage 1 through stage 4 chronic kidney disease, or unspecified chronic kidney disease: Secondary | ICD-10-CM | POA: Diagnosis not present

## 2024-09-10 DIAGNOSIS — K5730 Diverticulosis of large intestine without perforation or abscess without bleeding: Secondary | ICD-10-CM | POA: Diagnosis not present

## 2024-09-10 DIAGNOSIS — N184 Chronic kidney disease, stage 4 (severe): Secondary | ICD-10-CM | POA: Insufficient documentation

## 2024-09-10 DIAGNOSIS — K2100 Gastro-esophageal reflux disease with esophagitis, without bleeding: Secondary | ICD-10-CM | POA: Insufficient documentation

## 2024-09-10 DIAGNOSIS — K222 Esophageal obstruction: Secondary | ICD-10-CM | POA: Diagnosis not present

## 2024-09-10 DIAGNOSIS — I5022 Chronic systolic (congestive) heart failure: Secondary | ICD-10-CM | POA: Insufficient documentation

## 2024-09-10 DIAGNOSIS — Q439 Congenital malformation of intestine, unspecified: Secondary | ICD-10-CM

## 2024-09-10 DIAGNOSIS — D509 Iron deficiency anemia, unspecified: Secondary | ICD-10-CM

## 2024-09-10 DIAGNOSIS — Z8481 Family history of carrier of genetic disease: Secondary | ICD-10-CM | POA: Insufficient documentation

## 2024-09-10 DIAGNOSIS — Z7982 Long term (current) use of aspirin: Secondary | ICD-10-CM | POA: Insufficient documentation

## 2024-09-10 DIAGNOSIS — Z6830 Body mass index (BMI) 30.0-30.9, adult: Secondary | ICD-10-CM | POA: Insufficient documentation

## 2024-09-10 DIAGNOSIS — K449 Diaphragmatic hernia without obstruction or gangrene: Secondary | ICD-10-CM | POA: Diagnosis not present

## 2024-09-10 DIAGNOSIS — E669 Obesity, unspecified: Secondary | ICD-10-CM | POA: Insufficient documentation

## 2024-09-10 DIAGNOSIS — K2980 Duodenitis without bleeding: Secondary | ICD-10-CM | POA: Diagnosis not present

## 2024-09-10 DIAGNOSIS — Z7969 Long term (current) use of other immunomodulators and immunosuppressants: Secondary | ICD-10-CM | POA: Insufficient documentation

## 2024-09-10 DIAGNOSIS — D124 Benign neoplasm of descending colon: Secondary | ICD-10-CM | POA: Diagnosis not present

## 2024-09-10 DIAGNOSIS — K2950 Unspecified chronic gastritis without bleeding: Secondary | ICD-10-CM

## 2024-09-10 DIAGNOSIS — Z79899 Other long term (current) drug therapy: Secondary | ICD-10-CM | POA: Insufficient documentation

## 2024-09-10 DIAGNOSIS — Z955 Presence of coronary angioplasty implant and graft: Secondary | ICD-10-CM | POA: Insufficient documentation

## 2024-09-10 HISTORY — PX: COLONOSCOPY: SHX5424

## 2024-09-10 HISTORY — PX: ESOPHAGOGASTRODUODENOSCOPY: SHX5428

## 2024-09-10 LAB — GLUCOSE, CAPILLARY: Glucose-Capillary: 99 mg/dL (ref 70–99)

## 2024-09-10 LAB — HM COLONOSCOPY

## 2024-09-10 SURGERY — COLONOSCOPY
Anesthesia: General

## 2024-09-10 MED ORDER — EPHEDRINE SULFATE (PRESSORS) 25 MG/5ML IV SOSY
PREFILLED_SYRINGE | INTRAVENOUS | Status: DC | PRN
Start: 2024-09-10 — End: 2024-09-10
  Administered 2024-09-10: 10 mg via INTRAVENOUS

## 2024-09-10 MED ORDER — LACTATED RINGERS IV SOLN: INTRAVENOUS | Status: DC | PRN

## 2024-09-10 MED ORDER — SPOT INK MARKER SYRINGE KIT
PACK | SUBMUCOSAL | Status: DC | PRN
  Administered 2024-09-10 (×2): .4 mL via SUBMUCOSAL

## 2024-09-10 MED ORDER — SODIUM CHLORIDE (PF) 0.9 % IJ SOLN
PREFILLED_SYRINGE | INTRAVENOUS | Status: DC | PRN
  Administered 2024-09-10: 2 mL

## 2024-09-10 MED ORDER — FAMOTIDINE 40 MG PO TABS: 40.0000 mg | ORAL_TABLET | Freq: Every evening | ORAL | 1 refills | Status: AC

## 2024-09-10 MED ORDER — LACTATED RINGERS IV SOLN: INTRAVENOUS | Status: DC

## 2024-09-10 MED ORDER — PROPOFOL 500 MG/50ML IV EMUL
INTRAVENOUS | Status: DC | PRN
Start: 2024-09-10 — End: 2024-09-10
  Administered 2024-09-10: 50 mg via INTRAVENOUS
  Administered 2024-09-10: 30 mg via INTRAVENOUS
  Administered 2024-09-10: 125 ug/kg/min via INTRAVENOUS
  Administered 2024-09-10: 20 mg via INTRAVENOUS

## 2024-09-10 MED ORDER — LIDOCAINE 2% (20 MG/ML) 5 ML SYRINGE
INTRAMUSCULAR | Status: DC | PRN
Start: 2024-09-10 — End: 2024-09-10
  Administered 2024-09-10 (×2): 40 mg via INTRAVENOUS

## 2024-09-10 NOTE — Interval H&P Note (Signed)
 History and Physical Interval Note:  09/10/2024 10:18 AM  Darrell Marshall Credit Sr.  has presented today for surgery, with the diagnosis of iron deficiency anemia.  The various methods of treatment have been discussed with the patient and family. After consideration of risks, benefits and other options for treatment, the patient has consented to  Procedure(s) with comments: COLONOSCOPY (N/A) - 11:00am, ASA 3 EGD (ESOPHAGOGASTRODUODENOSCOPY) (N/A) as a surgical intervention.  The patient's history has been reviewed, patient examined, no change in status, stable for surgery.  I have reviewed the patient's chart and labs.  Questions were answered to the patient's satisfaction.     [name]

## 2024-09-10 NOTE — Discharge Instructions (Addendum)
 You are being discharged to home.  Resume your previous diet.  We are waiting for your pathology results.  Take Pepcid (famotidine) 40 mg by mouth once a day.   Your physician has recommended a repeat colonoscopy for surveillance based on pathology results.  Close follow up with hematology.

## 2024-09-10 NOTE — Op Note (Signed)
 [facility] Patient Name: Darrell Marshall Procedure Date: 09/10/2024 10:22 AM MRN: [id number] Date of Birth: Apr 28, 1935 Attending MD: [name] , , [id number] CSN: [id number] Age: 89 Admit Type: Outpatient Procedure:                Colonoscopy Indications:              Iron deficiency anemia Providers:                [name], Crystal Page, Daphne Mulch                            Technician, Technician Referring MD:              Medicines:                Monitored Anesthesia Care Complications:            No immediate complications., Estimated Blood Loss:     Estimated blood loss: none. Procedure:                Pre-Anesthesia Assessment:                           - Prior to the procedure, a History and Physical                            was performed, and patient medications, allergies                            and sensitivities were reviewed. The patient's                            tolerance of previous anesthesia was reviewed.                           - The risks and benefits of the procedure and the                            sedation options and risks were discussed with the                            patient. All questions were answered and informed                            consent was obtained.                           - ASA Grade Assessment: II - A patient with mild                            systemic disease.                           After obtaining informed consent, the colonoscope                            was passed under direct vision. Throughout the  procedure, the patient's blood pressure, pulse, and                            oxygen saturations were monitored continuously. The                            PCF-HQ190L ([id number]) Peds Colon was introduced                            through the anus and advanced to the the cecum,                            identified by appendiceal orifice and ileocecal                             valve. The patient tolerated the procedure well.                            The quality of the bowel preparation was adequate.                            The colonoscopy was performed with difficulty due                            to a tortuous colon.                           22 Modifier was used because the patient udnderwent                            2 EMRs and had a colonsocopy time of more than one                            hour, with multiple polypectomies as well. Scope In: 10:53:35 AM Scope Out: 12:11:20 PM Scope Withdrawal Time: 1 hour 2 minutes 56 seconds  Total Procedure Duration: 1 hour 17 minutes 45 seconds  Findings:      The perianal and digital rectal examinations were normal.      A 20 to 25 mm polyp was found in the ascending colon. The polyp was       Paris classification IIa (superficial, elevated),mhad some irregular       area in the center of the polyp. Area was successfully injected with 6       mL Eleview for a lift polypectomy. Polyp location was very difficult due       to tortuosity of the colon. Area was successfully injected with 2 mL of       a 0.1 mg/mL solution of epinephrine  for a lift polypectomy. Imaging was       performed using white light and narrow band imaging to visualize the       mucosa and demarcate the polyp site after injection for EMR purposes.       The polyp was removed with a piecemeal technique using a combination of       cold and hot snare. Resection and retrieval were complete. To prevent  bleeding after the polypectomy, three hemostatic clips were successfully       placed (MR safe). Clip manufacturer: [employer]. There was no       bleeding at the end of the procedure. Area 2 cm distal and contralateral       was tattooed with an injection of 0.4 mL of [place] ink.      Two sessile polyps were found in the ascending colon. The polyps were 5       to 8 mm in size. These polyps were removed with a cold snare.  Resection       and retrieval were complete.      A 20 mm polyp was found in the transverse colon. The polyp was flat.       Area was successfully injected with 0.4 mL Eleview for a lift       polypectomy. Imaging was performed using white light and narrow band       imaging to visualize the mucosa and demarcate the polyp site after       injection for EMR purposes. The polyp was removed with a hot snare.       Resection and retrieval were complete.      Five sessile polyps were found in the sigmoid colon, descending colon       and transverse colon. The polyps were 3 to 8 mm in size. These polyps       were removed with a cold snare. Resection and retrieval were complete.      An 18 mm polyp was found in the sigmoid colon. The polyp was sessile.       The polyp was removed with a hot snare. Resection and retrieval were       complete. Upon completion of the polypectomy, the area was covered with       a layer of Purastat to prevent bleeding. Area was tattooed with an       injection of 0.4 mL of [place] ink.      Multiple medium-mouthed diverticula were found in the entire colon.      Non-bleeding internal hemorrhoids were found during retroflexion. The       hemorrhoids were small. Impression:               - One 20 to 25 mm polyp in the ascending colon,                            removed piecemeal using a cold and hot snare.                            Resected and retrieved via EMR. Clip manufacturer:                            [employer]. Clips (MR safe) were placed.                            Tattooed.                           - Two 5 to 8 mm polyps in the ascending colon,  removed with a cold snare. Resected and retrieved.                           - One 20 mm polyp in the transverse colon, removed                            with a hot snare. Resected and retrieved via EMR                           - Five 3 to 8 mm polyps in the sigmoid colon, in                             the descending colon and in the transverse colon,                            removed with a cold snare. Resected and retrieved.                           - One 18 mm polyp in the sigmoid colon, removed                            with a hot snare. Resected and retrieved. Tattooed.                           - Diverticulosis in the entire examined colon.                           - Non-bleeding internal hemorrhoids. Moderate Sedation:      Per Anesthesia Care Recommendation:           - Discharge patient to home (ambulatory).                           - Resume previous diet.                           - Await pathology results.                           - Repeat colonoscopy for surveillance based on                            pathology results. If benign pathology, will need                            repeat colonoscopy in 1 year due to piecemeal                            polypectomy.                           - Close follow up with hematology. Procedure Code(s):        --- Professional ---  54614, 59, Colonoscopy, flexible; with removal of                            tumor(s), polyp(s), or other lesion(s) by snare                            technique                           45381, Colonoscopy, flexible; with directed                            submucosal injection(s), any substance Diagnosis Code(s):        --- Professional ---                           D12.2, Benign neoplasm of ascending colon                           D12.3, Benign neoplasm of transverse colon (hepatic                            flexure or splenic flexure)                           D12.5, Benign neoplasm of sigmoid colon                           D12.4, Benign neoplasm of descending colon                           K64.8, Other hemorrhoids                           D50.9, Iron deficiency anemia, unspecified                           K57.30, Diverticulosis of large  intestine without                            perforation or abscess without bleeding CPT copyright 2022 American [employer] Association. All rights reserved. The codes documented in this report are preliminary and upon coder review may  be revised to meet current compliance requirements. [name], MD [name],  09/10/2024 12:29:46 PM This report has been signed electronically. Number of Addenda: 0

## 2024-09-10 NOTE — Transfer of Care (Addendum)
 Immediate Anesthesia Transfer of Care Note  Patient: Darrell BERTSCH Sr.  Procedure(s) Performed: COLONOSCOPY EGD (ESOPHAGOGASTRODUODENOSCOPY)  Patient Location: Short Stay  Anesthesia Type:General  Level of Consciousness: drowsy and patient cooperative  Airway & Oxygen Therapy: Patient Spontanous Breathing  Post-op Assessment: Report given to RN and Post -op Vital signs reviewed and stable  Post vital signs: Reviewed and stable  Last Vitals:  Vitals Value Taken Time  BP 130/70 09/10/24   1219  Temp 36.4 09/10/24   1243  Pulse 67 09/10/24   1219  Resp 17 09/10/24   1219  SpO2 99% 09/10/24   1219    Last Pain:  Vitals:   09/10/24 1034  TempSrc:   PainSc: 0-No pain         Complications: No notable events documented.

## 2024-09-10 NOTE — Op Note (Signed)
 [facility] Patient Name: Darrell Marshall Procedure Date: 09/10/2024 10:19 AM MRN: [id number] Date of Birth: 07/31/1935 Attending MD: [name] , , [id number] CSN: [id number] Age: 89 Admit Type: Outpatient Procedure:                Upper GI endoscopy Indications:              Iron deficiency anemia Providers:                [name], Crystal Page, Daphne Mulch                            Technician, Technician Referring MD:              Medicines:                Monitored Anesthesia Care Complications:            No immediate complications. Estimated Blood Loss:     Estimated blood loss: none. Procedure:                Pre-Anesthesia Assessment:                           - Prior to the procedure, a History and Physical                            was performed, and patient medications, allergies                            and sensitivities were reviewed. The patient's                            tolerance of previous anesthesia was reviewed.                           - The risks and benefits of the procedure and the                            sedation options and risks were discussed with the                            patient. All questions were answered and informed                            consent was obtained.                           - ASA Grade Assessment: II - A patient with mild                            systemic disease.                           After obtaining informed consent, the endoscope was                            passed under direct vision. Throughout the  procedure, the patient's blood pressure, pulse, and                            oxygen saturations were monitored continuously. The                            Endoscope was introduced through the mouth, and                            advanced to the second part of duodenum. The upper                            GI endoscopy was accomplished without difficulty.                             The patient tolerated the procedure well. Scope In: 10:40:21 AM Scope Out: 10:47:00 AM Total Procedure Duration: 0 hours 6 minutes 39 seconds  Findings:      A non-obstructing Schatzki ring was found at the gastroesophageal       junction.      A 2 cm hiatal hernia was present.      The entire examined stomach was normal. Biopsies were taken with a cold       forceps for Helicobacter pylori testing.      Mucosal changes were found in the ampulla characterized by mild bulging.       No clear cut adenomatous changes noted. Biopsies were taken with a cold       forceps for histology.      Patchy mild inflammation characterized by congestion (edema) and       erythema was found in the duodenal bulb. Biopsies were taken with a cold       forceps for histology. Impression:               - Non-obstructing Schatzki ring.                           - 2 cm hiatal hernia.                           - Normal stomach. Biopsied.                           - Ampulla was bulging. Biopsied.                           - Duodenitis. Biopsied. Moderate Sedation:      Per Anesthesia Care Recommendation:           - Discharge patient to home (ambulatory).                           - Resume previous diet.                           - Await pathology results.                           - Use Pepcid (famotidine) 40 mg  PO daily. Procedure Code(s):        --- Professional ---                           [phone], Esophagogastroduodenoscopy, flexible,                            transoral; with biopsy, single or multiple Diagnosis Code(s):        --- Professional ---                           K22.2, Esophageal obstruction                           K44.9, Diaphragmatic hernia without obstruction or                            gangrene                           K31.89, Other diseases of stomach and duodenum                           K29.80, Duodenitis without bleeding                           D50.9, Iron  deficiency anemia, unspecified CPT copyright 2022 American Medical Association. All rights reserved. The codes documented in this report are preliminary and upon coder review may  be revised to meet current compliance requirements. [name], MD [name],  09/10/2024 10:51:57 AM This report has been signed electronically. Number of Addenda: 0

## 2024-09-10 NOTE — Interval H&P Note (Signed)
 History and Physical Interval Note:  09/10/2024 12:15 PM  Darrell FORBES Credit Sr.  has presented today for surgery, with the diagnosis of iron deficiency anemia.  The various methods of treatment have been discussed with the patient and family. After consideration of risks, benefits and other options for treatment, the patient has consented to  Procedure(s) with comments: COLONOSCOPY (N/A) - 11:00am, ASA 3 EGD (ESOPHAGOGASTRODUODENOSCOPY) (N/A) as a surgical intervention.  The patient's history has been reviewed, patient examined, no change in status, stable for surgery.  I have reviewed the patient's chart and labs.  Questions were answered to the patient's satisfaction.     [name]

## 2024-09-10 NOTE — Anesthesia Procedure Notes (Signed)
 Date/Time: 09/10/2024 10:35 AM  Performed by: Para Jerelene CROME, CRNAOxygen Delivery Method: Nasal cannula Comments: OptiFlow Nasal Cannula.

## 2024-09-10 NOTE — Anesthesia Preprocedure Evaluation (Addendum)
 Anesthesia Evaluation  Patient identified by MRN, date of birth, ID band Patient awake    Reviewed: Allergy & Precautions, H&P , NPO status , Patient's Chart, lab work & pertinent test results, reviewed documented beta blocker date and time   Airway Mallampati: II  TM Distance: >3 FB Neck ROM: full    Dental no notable dental hx.    Pulmonary neg pulmonary ROS, former smoker   Pulmonary exam normal breath sounds clear to auscultation       Cardiovascular Exercise Tolerance: Good hypertension, + angina  + CAD, + Past MI and +CHF   Rhythm:regular Rate:Normal     Neuro/Psych  Neuromuscular disease  negative psych ROS   GI/Hepatic negative GI ROS, Neg liver ROS,,,  Endo/Other  diabetes    Renal/GU Renal disease  negative genitourinary   Musculoskeletal   Abdominal   Peds  Hematology  (+) Blood dyscrasia, anemia   Anesthesia Other Findings   Reproductive/Obstetrics negative OB ROS                              Anesthesia Physical Anesthesia Plan  ASA: 3  Anesthesia Plan: General   Post-op Pain Management:    Induction:   PONV Risk Score and Plan: Propofol infusion  Airway Management Planned:   Additional Equipment:   Intra-op Plan:   Post-operative Plan:   Informed Consent: I have reviewed the patients History and Physical, chart, labs and discussed the procedure including the risks, benefits and alternatives for the proposed anesthesia with the patient or authorized representative who has indicated his/her understanding and acceptance.     Dental Advisory Given  Plan Discussed with: CRNA  Anesthesia Plan Comments:         Anesthesia Quick Evaluation

## 2024-09-11 ENCOUNTER — Encounter (INDEPENDENT_AMBULATORY_CARE_PROVIDER_SITE_OTHER): Payer: Self-pay | Admitting: *Deleted

## 2024-09-11 ENCOUNTER — Encounter (HOSPITAL_COMMUNITY): Payer: Self-pay | Admitting: Gastroenterology

## 2024-09-12 ENCOUNTER — Ambulatory Visit (INDEPENDENT_AMBULATORY_CARE_PROVIDER_SITE_OTHER): Payer: Self-pay | Admitting: Gastroenterology

## 2024-09-13 LAB — SURGICAL PATHOLOGY

## 2024-09-13 NOTE — Anesthesia Postprocedure Evaluation (Signed)
 Anesthesia Post Note  Patient: Darrell OBRYAN Sr.  Procedure(s) Performed: COLONOSCOPY EGD (ESOPHAGOGASTRODUODENOSCOPY)  Patient location during evaluation: Phase II Anesthesia Type: General Level of consciousness: awake Pain management: pain level controlled Vital Signs Assessment: post-procedure vital signs reviewed and stable Respiratory status: spontaneous breathing and respiratory function stable Cardiovascular status: blood pressure returned to baseline and stable Postop Assessment: no headache and no apparent nausea or vomiting Anesthetic complications: no Comments: Late entry   No notable events documented.   Last Vitals:  Vitals:   09/10/24 1219 09/10/24 1243  BP: 130/70   Pulse: 67   Resp: 17   Temp:  36.4 C  SpO2: 99%     Last Pain:  Vitals:   09/10/24 1243  TempSrc: Oral  PainSc:                  Darrell Marshall

## 2024-09-13 NOTE — Progress Notes (Signed)
 1 yr TCS noted in recall Patient result letter mailed Patient's PCP is on EPIC .

## 2024-09-17 ENCOUNTER — Ambulatory Visit

## 2024-09-17 ENCOUNTER — Telehealth: Payer: Self-pay

## 2024-09-17 NOTE — Telephone Encounter (Signed)
 Copied from CRM [id number]. Topic: Appointments - Appointment Scheduling >> Sep 17, 2024 12:42 PM Emylou G wrote: Darrell Marshall called.. said she missed the call for his appt.. Pls call her to reschedule on the same number

## 2024-09-18 ENCOUNTER — Inpatient Hospital Stay: Admitting: Oncology

## 2024-09-26 ENCOUNTER — Encounter: Payer: Self-pay | Admitting: Internal Medicine

## 2024-09-26 ENCOUNTER — Encounter (INDEPENDENT_AMBULATORY_CARE_PROVIDER_SITE_OTHER): Payer: Self-pay | Admitting: Gastroenterology

## 2024-09-26 ENCOUNTER — Ambulatory Visit: Admitting: Internal Medicine

## 2024-09-26 VITALS — BP 164/80 | HR 89 | Ht 68.5 in | Wt 208.2 lb

## 2024-09-26 DIAGNOSIS — E1122 Type 2 diabetes mellitus with diabetic chronic kidney disease: Secondary | ICD-10-CM

## 2024-09-26 DIAGNOSIS — N184 Chronic kidney disease, stage 4 (severe): Secondary | ICD-10-CM

## 2024-09-26 DIAGNOSIS — I10 Essential (primary) hypertension: Secondary | ICD-10-CM | POA: Diagnosis not present

## 2024-09-26 DIAGNOSIS — E1121 Type 2 diabetes mellitus with diabetic nephropathy: Secondary | ICD-10-CM

## 2024-09-26 DIAGNOSIS — F5101 Primary insomnia: Secondary | ICD-10-CM

## 2024-09-26 DIAGNOSIS — Z23 Encounter for immunization: Secondary | ICD-10-CM | POA: Diagnosis not present

## 2024-09-26 DIAGNOSIS — E782 Mixed hyperlipidemia: Secondary | ICD-10-CM

## 2024-09-26 DIAGNOSIS — Z7984 Long term (current) use of oral hypoglycemic drugs: Secondary | ICD-10-CM

## 2024-09-26 MED ORDER — ATORVASTATIN CALCIUM 80 MG PO TABS
80.0000 mg | ORAL_TABLET | Freq: Every day | ORAL | 3 refills | Status: AC
Start: 2024-09-26 — End: ?

## 2024-09-26 MED ORDER — HYDRALAZINE HCL 50 MG PO TABS: 50.0000 mg | ORAL_TABLET | Freq: Three times a day (TID) | ORAL | 2 refills | Status: AC

## 2024-09-26 MED ORDER — METOPROLOL SUCCINATE ER 50 MG PO TB24: 50.0000 mg | ORAL_TABLET | Freq: Every day | ORAL | 3 refills | Status: AC

## 2024-09-26 NOTE — Assessment & Plan Note (Signed)
 Lab Results  Component Value Date   HGBA1C 5.6 03/26/2024   Well-controlled On Glipizide  2.5 mg QD, was on Lantus, DCed as he has very tightly controlled glycemic profile currently, plan to DC Glipizide  later as well DCed Jardiance recently due to AKI by Nephrology Advised to follow diabetic diet On statin Diabetic eye exam: Advised to follow up with Ophthalmology for diabetic eye exam

## 2024-09-26 NOTE — Assessment & Plan Note (Signed)
 On Lipitor 80 mg once daily - needs to be compliant Checked lipid profile

## 2024-09-26 NOTE — Assessment & Plan Note (Signed)
 Last BMP reviewed - GFR stays around 20 now, last GFR 16 Followed by Nephrology - Dr. Carrolyn Clan Avoid nephrotoxic agents including NSAIDs Maintain adequate hydration

## 2024-09-26 NOTE — Patient Instructions (Addendum)
 Please schedule AWV.  Please take Nifedipine 30 mg twice daily, Hydralazine  50 mg 3 times daily and Metoprolol  50 mg once daily for blood pressure.  Please do not take Amlodipine .  Please continue to follow low carb diet and ambulate as tolerated.

## 2024-09-26 NOTE — Progress Notes (Signed)
 Established Patient Office Visit  Subjective:  Patient ID: Darrell HOQUE Sr., male    DOB: 05-08-1935  Age: 89 y.o. MRN: [id number]  CC:  Chief Complaint  Patient presents with   Diabetes    6 month f/u    Chronic Kidney Disease    6 month f/u     HPI Darrell VANDERWERF Sr. is a 89 y.o. male with past medical history of CAD s/p stent placement, HTN, type II DM with CKD, RA and HLD who presents for follow-up of his chronic medical conditions.  CAD and HTN: BP is elevated today, but he has not taken his medicines today.  He is supposed to take nifedipine 30 mg BID, hydralazine  50 mg TID and metoprolol  50 mg QD. Patient denies headache, dizziness, chest pain, dyspnea or palpitations.  He follows up with cardiology for history of CAD.  He takes aspirin  and statin.  Type II DM: He takes Glipizide  2.5 mg QD now. His blood glucose remains controlled.  Jardiance was discontinued due to AKI.  His HbA1c was 5.6 in 05/25. His blood glucose has been around 110s in the morning.  Denies any episode of hypoglycemia.  Denies any fatigue, polyuria, polyphagia or recent change in weight.  He also takes statin.  CKD: Follows up with nephrology.  Denies any dysuria or hematuria. Denies any LE swelling currently.  Insomnia: He still has difficulty maintaining sleep, but is better with trazodone  50 mg QD. Of note, he reports daytime naps.  Denies anhedonia, SI or HI currently.   Past Medical History:  Diagnosis Date   Atherosclerotic heart disease of native coronary artery with unspecified angina pectoris    CAD (coronary artery disease), native coronary artery    cath 01/12/2016 50% ost D1, 60% prox LCx, 100% distal RCA lesion treated with DES   Carpal tunnel syndrome, bilateral upper limbs    Chronic systolic (congestive) heart failure (HCC)    CKD (chronic kidney disease), stage III (HCC) 01/15/2016   Diabetes mellitus without complication (HCC)    Essential (primary) hypertension    Hypertension     Hypertensive chronic kidney disease with stage 1 through stage 4 chronic kidney disease, or unspecified chronic kidney disease    Ischemic cardiomyopathy    Echo 01/13/2016 EF 40-45%   Kidney disease, chronic, stage III (moderate, EGFR 30-59 ml/min) (HCC)    Obesity, unspecified    Reflux esophagitis    Rheumatoid arthritis, unspecified (HCC)    ST elevation myocardial infarction (STEMI) of inferior wall (HCC) 01/12/2016   Subsequent ST elevation (STEMI) myocardial infarction of anterior wall (HCC)    Type 2 diabetes mellitus with diabetic nephropathy (HCC)    Type 2 diabetes mellitus with unspecified complications [facility])     Past Surgical History:  Procedure Laterality Date   APPENDECTOMY     CARDIAC CATHETERIZATION N/A 01/12/2016   Procedure: Left Heart Cath and Coronary Angiography;  Surgeon: Ozell Fell, MD;  Location: [facility] INVASIVE CV LAB;  Service: Cardiovascular;  Laterality: N/A;   CARDIAC CATHETERIZATION N/A 01/12/2016   Procedure: Coronary Stent Intervention;  Surgeon: Ozell Fell, MD;  Location: [facility] INVASIVE CV LAB;  Service: Cardiovascular;  Laterality: N/A;   CATARACT EXTRACTION W/PHACO Right 12/30/2019   Procedure: CATARACT EXTRACTION PHACO AND INTRAOCULAR LENS PLACEMENT (IOC);  Surgeon: Harrie Agent, MD;  Location: [facility] ORS;  Service: Ophthalmology;  Laterality: Right;  CDE: 4.06   CATARACT EXTRACTION W/PHACO Left 01/13/2020   Procedure: CATARACT EXTRACTION PHACO AND INTRAOCULAR LENS PLACEMENT (IOC);  Surgeon: Harrie Agent, MD;  Location: [facility] ORS;  Service: Ophthalmology;  Laterality: Left;  CDE: 6.68   COLONOSCOPY N/A 09/10/2024   Procedure: COLONOSCOPY;  Surgeon: Eartha Angelia Sieving, MD;  Location: [facility] ENDO SUITE;  Service: Gastroenterology;  Laterality: N/A;  11:00am, ASA 3   CORONARY STENT PLACEMENT  01/12/16   ESOPHAGOGASTRODUODENOSCOPY N/A 09/10/2024   Procedure: EGD (ESOPHAGOGASTRODUODENOSCOPY);  Surgeon: Eartha Angelia, Sieving, MD;  Location: [facility] SU[facility];   Service: Gastroenterology;  Laterality: N/A;    Family History  Problem Relation Age of Onset   Ovarian cancer Mother    Diabetes Mother    Hypertension Mother    Prostate cancer Father    Heart attack Neg Hx    Stroke Neg Hx     Social History   Socioeconomic History   Marital status: Widowed    Spouse name: Not on file   Number of children: Not on file   Years of education: Not on file   Highest education level: Not on file  Occupational History   Not on file  Tobacco Use   Smoking status: Former    Current packs/day: 0.00    Types: Cigarettes    Quit date: 08/30/2004    Years since quitting: 20.0   Smokeless tobacco: Never  Vaping Use   Vaping status: Never Used  Substance and Sexual Activity   Alcohol use: No   Drug use: No   Sexual activity: Not on file  Other Topics Concern   Not on file  Social History Narrative   Not on file   Social Drivers of Health   Financial Resource Strain: Low Risk  (03/15/2023)   Overall Financial Resource Strain (CARDIA)    Difficulty of Paying Living Expenses: Not hard at all  Food Insecurity: No Food Insecurity (03/15/2023)   Hunger Vital Sign    Worried About Running Out of Food in the Last Year: Never true    Ran Out of Food in the Last Year: Never true  Transportation Needs: No Transportation Needs (03/15/2023)   PRAPARE - Administrator, Civil Service (Medical): No    Lack of Transportation (Non-Medical): No  Physical Activity: Sufficiently Active (03/15/2023)   Exercise Vital Sign    Days of Exercise per Week: 7 days    Minutes of Exercise per Session: 60 min  Stress: No Stress Concern Present (03/15/2023)   [employer] of Occupational Health - Occupational Stress Questionnaire    Feeling of Stress : Not at all  Social Connections: Moderately Isolated (03/15/2023)   Social Connection and Isolation Panel    Frequency of Communication with Friends and Family: Twice a week    Frequency of Social  Gatherings with Friends and Family: Twice a week    Attends Religious Services: More than 4 times per year    Active Member of [employer] or Organizations: No    Attends Banker Meetings: Never    Marital Status: Widowed  Intimate Partner Violence: Not At Risk (03/15/2023)   Humiliation, Afraid, Rape, and Kick questionnaire    Fear of Current or Ex-Partner: No    Emotionally Abused: No    Physically Abused: No    Sexually Abused: No    Outpatient Medications Prior to Visit  Medication Sig Dispense Refill   aspirin  81 MG chewable tablet Chew 1 tablet (81 mg total) by mouth daily.     Blood Glucose Monitoring Suppl DEVI 1 each by Does not apply route in the morning, at noon,  and at bedtime. May substitute to any manufacturer covered by patient's insurance. 1 each 0   famotidine (PEPCID) 40 MG tablet Take 1 tablet (40 mg total) by mouth every evening. 90 tablet 1   glipiZIDE  (GLUCOTROL  XL) 5 MG 24 hr tablet TAKE 1 TABLET BY MOUTH EVERY DAY WITH BREAKFAST 90 tablet 1   Insulin  Pen Needle (BD PEN NEEDLE NANO 2ND GEN) 32G X 4 MM MISC 1 each by Does not apply route daily. 100 each 5   leflunomide (ARAVA) 20 MG tablet Take 20 mg by mouth daily.     Multiple Vitamins-Minerals (MULTIVITAMIN ADULTS 50+ PO) Take 1 tablet by mouth daily. Men     mupirocin  ointment (BACTROBAN ) 2 % Apply 1 Application topically 2 (two) times daily. 22 g 0   NIFEdipine (ADALAT CC) 30 MG 24 hr tablet SMARTSIG:1 Tablet(s) By Mouth Every 12 Hours     nitroGLYCERIN  (NITROSTAT ) 0.4 MG SL tablet Place 1 tablet (0.4 mg total) under the tongue every 5 (five) minutes x 3 doses as needed for chest pain. 25 tablet 3   tamsulosin  (FLOMAX ) 0.4 MG CAPS capsule Take 1 capsule (0.4 mg total) by mouth daily. 90 capsule 1   traZODone  (DESYREL ) 50 MG tablet Take 1 tablet (50 mg total) by mouth at bedtime as needed for sleep. 90 tablet 2   triamcinolone  ointment (KENALOG ) 0.5 % Apply 1 Application topically 2 (two) times daily. As  needed 30 g 2   vitamin B-12 (CYANOCOBALAMIN) 500 MCG tablet Take 1,000 mcg by mouth daily.      amLODipine  (NORVASC ) 5 MG tablet Take 1 tablet (5 mg total) by mouth daily. 90 tablet 3   atorvastatin  (LIPITOR ) 80 MG tablet Take 1 tablet (80 mg total) by mouth daily at 6 PM. 90 tablet 3   empagliflozin (JARDIANCE) 10 MG TABS tablet Take 1 tablet by mouth daily.     hydrALAZINE  (APRESOLINE ) 50 MG tablet Take 1 tablet (50 mg total) by mouth 3 (three) times daily. 270 tablet 2   metoprolol  succinate (TOPROL -XL) 50 MG 24 hr tablet Take 1 tablet (50 mg total) by mouth daily. 90 tablet 3   No facility-administered medications prior to visit.    Allergies  Allergen Reactions   Losartan  Potassium Other (See Comments)    ROS Review of Systems  Constitutional:  Negative for chills and fever.  HENT:  Negative for congestion and sore throat.   Eyes:  Negative for pain and discharge.  Respiratory:  Negative for cough and shortness of breath.   Cardiovascular:  Negative for chest pain and palpitations.  Gastrointestinal:  Negative for constipation, diarrhea, nausea and vomiting.  Endocrine: Negative for polydipsia and polyuria.  Genitourinary:  Negative for dysuria and hematuria.  Musculoskeletal:  Positive for arthralgias. Negative for neck pain and neck stiffness.  Skin:  Negative for rash.  Neurological:  Negative for dizziness, weakness, numbness and headaches.  Psychiatric/Behavioral:  Positive for sleep disturbance. Negative for agitation and behavioral problems.       Objective:    Physical Exam Vitals reviewed.  Constitutional:      General: He is not in acute distress.    Appearance: He is not diaphoretic.  HENT:     Head: Normocephalic and atraumatic.     Nose: Nose normal.     Mouth/Throat:     Mouth: Mucous membranes are moist.  Eyes:     General: No scleral icterus.    Extraocular Movements: Extraocular movements intact.  Cardiovascular:  Rate and Rhythm: Normal rate  and regular rhythm.     Heart sounds: Normal heart sounds. No murmur heard. Pulmonary:     Breath sounds: Normal breath sounds. No wheezing or rales.  Musculoskeletal:     Cervical back: Neck supple. No tenderness.     Right lower leg: No edema.     Left lower leg: No edema.  Skin:    General: Skin is warm.     Findings: Rash (Lichenified eczema over left leg near ankle) present.  Neurological:     General: No focal deficit present.     Mental Status: He is alert and oriented to person, place, and time.  Psychiatric:        Mood and Affect: Mood normal.        Behavior: Behavior normal.     BP (!) 164/80 (BP Location: Left Arm)   Pulse 89   Ht 5' 8.5 (1.74 m)   Wt 208 lb 3.2 oz (94.4 kg)   SpO2 98%   BMI 31.20 kg/m  Wt Readings from Last 3 Encounters:  09/26/24 208 lb 3.2 oz (94.4 kg)  09/10/24 205 lb 14.6 oz (93.4 kg)  09/02/24 205 lb 14.4 oz (93.4 kg)    Lab Results  Component Value Date   TSH 3.390 12/22/2022   Lab Results  Component Value Date   WBC 8.9 08/30/2024   HGB 10.5 (L) 08/30/2024   HCT 36.3 (L) 08/30/2024   MCV 69.2 (L) 08/30/2024   PLT 316 08/30/2024   Lab Results  Component Value Date   NA 137 07/25/2024   K 3.6 07/25/2024   CO2 19 (L) 07/25/2024   GLUCOSE 132 (H) 07/25/2024   BUN 35 (H) 07/25/2024   CREATININE 3.60 (H) 07/25/2024   BILITOT 0.3 01/24/2024   ALKPHOS 94 01/24/2024   AST 21 01/24/2024   ALT 13 01/24/2024   PROT 7.6 01/24/2024   ALBUMIN 3.3 (L) 07/25/2024   CALCIUM  8.7 (L) 07/25/2024   ANIONGAP 13 07/25/2024   EGFR 14.0 04/19/2024   Lab Results  Component Value Date   CHOL 88 (L) 12/22/2022   Lab Results  Component Value Date   HDL 33 (L) 12/22/2022   Lab Results  Component Value Date   LDLCALC 34 12/22/2022   Lab Results  Component Value Date   TRIG 115 12/22/2022   Lab Results  Component Value Date   CHOLHDL 2.7 12/22/2022   Lab Results  Component Value Date   HGBA1C 5.6 03/26/2024      Assessment  & Plan:   Problem List Items Addressed This Visit       Cardiovascular and Mediastinum   Essential (primary) hypertension   BP Readings from Last 1 Encounters:  09/26/24 (!) 164/80   Elevated today as he has not taken his medicines Needs to take nifedipine 30 mg BID, metoprolol  50 mg QD and Hydralazine  50 mg TID regularly - his niece organizes his medicines, advised to show his after visit summary to her DC amlodipine  from chart as he is on nifedipine now (nifedipine prescribed by Dr. Rachele) Counseled for compliance with the medications Advised DASH diet and moderate exercise/walking as tolerated  Advised to bring medications in the next visit for review      Relevant Medications   hydrALAZINE  (APRESOLINE ) 50 MG tablet   atorvastatin  (LIPITOR ) 80 MG tablet   metoprolol  succinate (TOPROL -XL) 50 MG 24 hr tablet     Endocrine   Type 2 diabetes mellitus with diabetic nephropathy (  HCC) - Primary   Lab Results  Component Value Date   HGBA1C 5.6 03/26/2024   Well-controlled On Glipizide  2.5 mg QD, was on Lantus, DCed as he has very tightly controlled glycemic profile currently, plan to DC Glipizide  later as well DCed Jardiance recently due to AKI by Nephrology Advised to follow diabetic diet On statin Diabetic eye exam: Advised to follow up with Ophthalmology for diabetic eye exam      Relevant Medications   atorvastatin  (LIPITOR ) 80 MG tablet   Other Relevant Orders   Bayer DCA Hb A1c Waived     Genitourinary   CKD (chronic kidney disease) stage 4, GFR 15-29 ml/min (HCC)   Last BMP reviewed - GFR stays around 20 now, last GFR 16 Followed by Nephrology - Dr. Rachele Avoid nephrotoxic agents including NSAIDs Maintain adequate hydration        Other   Primary insomnia   Sleep hygiene discussed, material provided Needs to avoid daytime naps On trazodone  50 mg as needed for insomnia      Mixed hyperlipidemia   On Lipitor  80 mg once daily - needs to be  compliant Checked lipid profile      Relevant Medications   hydrALAZINE  (APRESOLINE ) 50 MG tablet   atorvastatin  (LIPITOR ) 80 MG tablet   metoprolol  succinate (TOPROL -XL) 50 MG 24 hr tablet   Other Visit Diagnoses       Encounter for immunization       Relevant Orders   Flu vaccine HIGH DOSE PF(Fluzone Trivalent) (Completed)         Meds ordered this encounter  Medications   hydrALAZINE  (APRESOLINE ) 50 MG tablet    Sig: Take 1 tablet (50 mg total) by mouth 3 (three) times daily.    Dispense:  270 tablet    Refill:  2   atorvastatin  (LIPITOR ) 80 MG tablet    Sig: Take 1 tablet (80 mg total) by mouth daily at 6 PM.    Dispense:  90 tablet    Refill:  3   metoprolol  succinate (TOPROL -XL) 50 MG 24 hr tablet    Sig: Take 1 tablet (50 mg total) by mouth daily.    Dispense:  90 tablet    Refill:  3    Follow-up: Return in about 6 months (around 03/26/2025).    Suzzane MARLA Blanch, MD

## 2024-09-26 NOTE — Assessment & Plan Note (Addendum)
 BP Readings from Last 1 Encounters:  09/26/24 (!) 164/80   Elevated today as he has not taken his medicines Needs to take nifedipine 30 mg BID, metoprolol  50 mg QD and Hydralazine  50 mg TID regularly - his niece organizes his medicines, advised to show his after visit summary to her DC amlodipine  from chart as he is on nifedipine now (nifedipine prescribed by Dr. Rachele) Counseled for compliance with the medications Advised DASH diet and moderate exercise/walking as tolerated  Advised to bring medications in the next visit for review

## 2024-09-26 NOTE — Assessment & Plan Note (Signed)
 Sleep hygiene discussed, material provided Needs to avoid daytime naps On trazodone  50 mg as needed for insomnia

## 2024-10-01 ENCOUNTER — Ambulatory Visit

## 2024-10-02 NOTE — Telephone Encounter (Signed)
 Visit completed.

## 2024-10-22 ENCOUNTER — Inpatient Hospital Stay: Attending: Hematology | Admitting: Oncology

## 2024-10-22 VITALS — BP 136/66 | HR 63 | Temp 97.6°F | Resp 18 | Ht 68.5 in | Wt 205.0 lb

## 2024-10-22 DIAGNOSIS — N189 Chronic kidney disease, unspecified: Secondary | ICD-10-CM | POA: Insufficient documentation

## 2024-10-22 DIAGNOSIS — Z832 Family history of diseases of the blood and blood-forming organs and certain disorders involving the immune mechanism: Secondary | ICD-10-CM | POA: Insufficient documentation

## 2024-10-22 DIAGNOSIS — D509 Iron deficiency anemia, unspecified: Secondary | ICD-10-CM | POA: Diagnosis present

## 2024-10-22 DIAGNOSIS — D649 Anemia, unspecified: Secondary | ICD-10-CM

## 2024-10-22 DIAGNOSIS — I129 Hypertensive chronic kidney disease with stage 1 through stage 4 chronic kidney disease, or unspecified chronic kidney disease: Secondary | ICD-10-CM | POA: Insufficient documentation

## 2024-10-22 DIAGNOSIS — E1122 Type 2 diabetes mellitus with diabetic chronic kidney disease: Secondary | ICD-10-CM | POA: Diagnosis not present

## 2024-10-22 DIAGNOSIS — Z79899 Other long term (current) drug therapy: Secondary | ICD-10-CM | POA: Diagnosis not present

## 2024-10-22 MED ORDER — FERROUS GLUCONATE 324 (38 FE) MG PO TABS: 324.0000 mg | ORAL_TABLET | ORAL | 3 refills | Status: AC

## 2024-10-22 MED ORDER — VITAMIN C 250 MG PO TABS: 250.0000 mg | ORAL_TABLET | Freq: Every day | ORAL | 3 refills | Status: AC

## 2024-10-22 NOTE — Progress Notes (Unsigned)
 [facility] OFFICE PROGRESS NOTE  Darrell Suzzane POUR, MD  ASSESSMENT & PLAN:    Assessment & Plan Anemia, unspecified type Anemia likely due to chronic kidney disease, characterized by low iron levels but normal ferritin, indicating anemia of chronic disease rather than iron deficiency. Hemoglobin has been trending down over the past 5-7 years, currently at 9, which does not yet necessitate a blood transfusion. Differential diagnosis includes nutritional anemia, multiple myeloma, and myelodysplastic syndrome (MDS), but kidney disease is sufficient to explain the anemia. Thalassemia is considered but unlikely to be the main cause. - Order blood tests to check blood counts, iron, B12, folate, and EPO levels. - Order tests to rule out multiple myeloma and hemolytic anemia. - Consider ESA if hemoglobin drops to 9 or below, with caution to avoid over-treatment due to risk of stroke and heart attack. - Schedule follow-up in 2-3 weeks to revi  No orders of the defined types were placed in this encounter.   INTERVAL HISTORY: Patient returns for follow-up.  Darrell FORBES Credit Sr. is an 89 year old male with chronic kidney disease and coronary artery disease who presents for follow-up of anemia. He is accompanied by his sister, Darrell [facility]. He was referred by his kidney doctor for evaluation of anemia.   Anemia was identified during a blood test, with hemoglobin levels trending down over the past five to seven years, reaching a level of 9.3 a month ago.  Last showed low serum iron and saturation, normal TIBC, and normal ferritin at 288.  There is a family history of thalassemia and sickle cell trait. His sister may also have the sickle cell trait.   He has longstanding history of hypertension and diabetes, chronic kidney disease is present with a creatinine level of 3.6, worsening over time. He has been under nephrology care for approximately three years.   Current medications include  glipizide , amlodipine , atorvastatin , hydralazine , metoprolol , Jardiance, and a daily baby aspirin . Trazodone  is prescribed for sleep but is not taken regularly.  We reviewed ***  SUMMARY OF HEMATOLOGIC HISTORY: Oncology History   No history exists.     CBC    Component Value Date/Time   WBC 8.9 08/30/2024 1003   RBC 4.87 08/30/2024 1004   RBC 4.90 08/30/2024 1003   HGB 10.5 (L) 08/30/2024 1003   HCT 36.3 (L) 08/30/2024 1004   HCT 33.9 (L) 08/30/2024 1003   PLT 316 08/30/2024 1003   MCV 69.2 (L) 08/30/2024 1003   MCH 21.4 (L) 08/30/2024 1003   MCHC 31.0 08/30/2024 1003   RDW 18.2 (H) 08/30/2024 1003   LYMPHSABS 1.3 08/30/2024 1003   MONOABS 0.9 08/30/2024 1003   EOSABS 0.5 08/30/2024 1003   BASOSABS 0.1 08/30/2024 1003       Latest Ref Rng & Units 07/25/2024   10:51 AM 01/24/2024    2:05 PM 12/22/2022   11:14 AM  CMP  Glucose 70 - 99 mg/dL 867  889  868   BUN 8 - 23 mg/dL 35  30  26   Creatinine 0.61 - 1.24 mg/dL 6.39  6.52  7.29   Sodium 135 - 145 mmol/L 137  136  141   Potassium 3.5 - 5.1 mmol/L 3.6  3.7  4.2   Chloride 98 - 111 mmol/L 105  105  105   CO2 22 - 32 mmol/L 19  22  19    Calcium  8.9 - 10.3 mg/dL 8.7  8.8  9.4   Total Protein 6.5 - 8.1  g/dL  7.6  6.4   Total Bilirubin 0.0 - 1.2 mg/dL  0.3  0.3   Alkaline Phos 38 - 126 U/L  94  100   AST 15 - 41 U/L  21  24   ALT 0 - 44 U/L  13  16      Lab Results  Component Value Date   FERRITIN 445 (H) 08/30/2024   VITAMINB12 1,364 (H) 08/30/2024    Vitals:   10/22/24 1043  BP: 136/66  Pulse: 63  Resp: 18  Temp: 97.6 F (36.4 C)  SpO2: 100%    Review of System:  ROS  Physical Exam: Physical Exam   I spent *** minutes dedicated to the care of this patient (face-to-face and non-face-to-face) on the date of the encounter to include what is described in the assessment and plan.,  Delon Hope, NP 10/22/2024 11:09 AM

## 2024-10-22 NOTE — Assessment & Plan Note (Addendum)
 Anemia likely due to chronic kidney disease, characterized by low iron levels but normal ferritin, indicating anemia of chronic disease rather than iron deficiency. Hemoglobin has been trending down over the past 5-7 years, currently at 9, which does not yet necessitate a blood transfusion. Differential diagnosis includes nutritional anemia, multiple myeloma, and myelodysplastic syndrome (MDS), but kidney disease is sufficient to explain the anemia. Thalassemia is considered but unlikely to be the main cause. - Order blood tests to check blood counts, iron, B12, folate, and EPO levels. - Order tests to rule out multiple myeloma and hemolytic anemia. - Consider ESA if hemoglobin drops to 9 or below, with caution to avoid over-treatment due to risk of stroke and heart attack. - Schedule follow-up in 2-3 weeks to revi

## 2024-12-10 ENCOUNTER — Ambulatory Visit

## 2025-01-21 ENCOUNTER — Inpatient Hospital Stay: Attending: Hematology

## 2025-01-28 ENCOUNTER — Inpatient Hospital Stay: Admitting: Oncology

## 2025-03-26 ENCOUNTER — Ambulatory Visit: Admitting: Internal Medicine

## 2025-10-03 ENCOUNTER — Ambulatory Visit
